# Patient Record
Sex: Female | Born: 1985
Health system: Southern US, Community
[De-identification: ages and names within clinical notes are randomized; demographics above are authoritative.]

## PROBLEM LIST (undated history)

## (undated) ENCOUNTER — Inpatient Hospital Stay (HOSPITAL_COMMUNITY): Payer: Self-pay

## (undated) DIAGNOSIS — J40 Bronchitis, not specified as acute or chronic: Secondary | ICD-10-CM

## (undated) DIAGNOSIS — K819 Cholecystitis, unspecified: Secondary | ICD-10-CM

## (undated) DIAGNOSIS — F419 Anxiety disorder, unspecified: Secondary | ICD-10-CM

## (undated) DIAGNOSIS — A048 Other specified bacterial intestinal infections: Secondary | ICD-10-CM

## (undated) DIAGNOSIS — F32A Depression, unspecified: Secondary | ICD-10-CM

## (undated) DIAGNOSIS — K5900 Constipation, unspecified: Secondary | ICD-10-CM

## (undated) DIAGNOSIS — K219 Gastro-esophageal reflux disease without esophagitis: Secondary | ICD-10-CM

## (undated) DIAGNOSIS — F329 Major depressive disorder, single episode, unspecified: Secondary | ICD-10-CM

## (undated) DIAGNOSIS — D259 Leiomyoma of uterus, unspecified: Secondary | ICD-10-CM

## (undated) DIAGNOSIS — G43909 Migraine, unspecified, not intractable, without status migrainosus: Secondary | ICD-10-CM

## (undated) DIAGNOSIS — A749 Chlamydial infection, unspecified: Secondary | ICD-10-CM

## (undated) HISTORY — DX: Leiomyoma of uterus, unspecified: D25.9

## (undated) HISTORY — PX: NO PAST SURGERIES: SHX2092

## (undated) HISTORY — PX: INDUCED ABORTION: SHX677

## (undated) HISTORY — PX: THERAPEUTIC ABORTION: SHX798

---
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
## 2006-02-11 ENCOUNTER — Other Ambulatory Visit: Admission: RE | Admit: 2006-02-11 | Discharge: 2006-02-11 | Payer: Self-pay | Admitting: Obstetrics and Gynecology

## 2006-06-10 ENCOUNTER — Inpatient Hospital Stay (HOSPITAL_COMMUNITY): Admission: AD | Admit: 2006-06-10 | Discharge: 2006-06-10 | Payer: Self-pay | Admitting: Obstetrics and Gynecology

## 2006-07-31 ENCOUNTER — Inpatient Hospital Stay (HOSPITAL_COMMUNITY): Admission: AD | Admit: 2006-07-31 | Discharge: 2006-07-31 | Payer: Self-pay | Admitting: Obstetrics and Gynecology

## 2006-08-01 ENCOUNTER — Inpatient Hospital Stay (HOSPITAL_COMMUNITY): Admission: AD | Admit: 2006-08-01 | Discharge: 2006-08-04 | Payer: Self-pay | Admitting: Obstetrics and Gynecology

## 2006-09-27 ENCOUNTER — Other Ambulatory Visit: Admission: RE | Admit: 2006-09-27 | Discharge: 2006-09-27 | Payer: Self-pay | Admitting: Obstetrics and Gynecology

## 2006-10-05 ENCOUNTER — Emergency Department (HOSPITAL_COMMUNITY): Admission: EM | Admit: 2006-10-05 | Discharge: 2006-10-05 | Payer: Self-pay | Admitting: Emergency Medicine

## 2006-12-09 ENCOUNTER — Emergency Department (HOSPITAL_COMMUNITY): Admission: EM | Admit: 2006-12-09 | Discharge: 2006-12-09 | Payer: Self-pay | Admitting: Emergency Medicine

## 2007-05-04 ENCOUNTER — Emergency Department (HOSPITAL_COMMUNITY): Admission: EM | Admit: 2007-05-04 | Discharge: 2007-05-04 | Payer: Self-pay | Admitting: Emergency Medicine

## 2007-08-02 ENCOUNTER — Emergency Department (HOSPITAL_COMMUNITY): Admission: EM | Admit: 2007-08-02 | Discharge: 2007-08-02 | Payer: Self-pay | Admitting: Emergency Medicine

## 2008-10-28 ENCOUNTER — Emergency Department (HOSPITAL_COMMUNITY): Admission: EM | Admit: 2008-10-28 | Discharge: 2008-10-28 | Payer: Self-pay | Admitting: Emergency Medicine

## 2009-12-28 ENCOUNTER — Emergency Department (HOSPITAL_COMMUNITY): Admission: EM | Admit: 2009-12-28 | Discharge: 2009-12-28 | Payer: Self-pay | Admitting: Emergency Medicine

## 2010-04-25 ENCOUNTER — Emergency Department (HOSPITAL_COMMUNITY): Admission: EM | Admit: 2010-04-25 | Discharge: 2010-04-25 | Payer: Self-pay | Admitting: Emergency Medicine

## 2010-04-30 ENCOUNTER — Emergency Department (HOSPITAL_COMMUNITY): Admission: EM | Admit: 2010-04-30 | Discharge: 2010-04-30 | Payer: Self-pay

## 2011-03-20 LAB — URINALYSIS, ROUTINE W REFLEX MICROSCOPIC
Glucose, UA: NEGATIVE mg/dL
Hgb urine dipstick: NEGATIVE
Ketones, ur: NEGATIVE mg/dL
Nitrite: NEGATIVE
Specific Gravity, Urine: 1.02 (ref 1.005–1.030)
Specific Gravity, Urine: 1.01 (ref 1.005–1.030)
Urobilinogen, UA: 0.2 mg/dL (ref 0.0–1.0)
pH: 6 (ref 5.0–8.0)

## 2011-03-20 LAB — COMPREHENSIVE METABOLIC PANEL
ALT: 23 U/L (ref 0–35)
AST: 23 U/L (ref 0–37)
Albumin: 4.1 g/dL (ref 3.5–5.2)
Alkaline Phosphatase: 49 U/L (ref 39–117)
Glucose, Bld: 101 mg/dL — ABNORMAL HIGH (ref 70–99)
Potassium: 3.8 mEq/L (ref 3.5–5.1)
Sodium: 136 mEq/L (ref 135–145)
Total Protein: 7.2 g/dL (ref 6.0–8.3)

## 2011-03-20 LAB — DIFFERENTIAL
Basophils Relative: 0 % (ref 0–1)
Eosinophils Absolute: 0.2 10*3/uL (ref 0.0–0.7)
Eosinophils Relative: 3 % (ref 0–5)
Monocytes Absolute: 0.8 10*3/uL (ref 0.1–1.0)
Monocytes Relative: 9 % (ref 3–12)
Neutrophils Relative %: 62 % (ref 43–77)

## 2011-03-20 LAB — CBC
Hemoglobin: 14.3 g/dL (ref 12.0–15.0)
RDW: 12.9 % (ref 11.5–15.5)

## 2011-03-20 LAB — PREGNANCY, URINE: Preg Test, Ur: NEGATIVE

## 2011-04-02 LAB — URINALYSIS, ROUTINE W REFLEX MICROSCOPIC
Glucose, UA: NEGATIVE mg/dL
Hgb urine dipstick: NEGATIVE
Ketones, ur: NEGATIVE mg/dL
Protein, ur: NEGATIVE mg/dL

## 2011-04-02 LAB — URINE MICROSCOPIC-ADD ON

## 2011-04-02 LAB — WET PREP, GENITAL
Trich, Wet Prep: NONE SEEN
Yeast Wet Prep HPF POC: NONE SEEN

## 2011-04-02 LAB — GC/CHLAMYDIA PROBE AMP, GENITAL: Chlamydia, DNA Probe: POSITIVE — AB

## 2011-05-14 ENCOUNTER — Emergency Department (HOSPITAL_COMMUNITY)
Admission: EM | Admit: 2011-05-14 | Discharge: 2011-05-14 | Disposition: A | Discharge status: Home / Self Care | Payer: Self-pay | Attending: Emergency Medicine | Admitting: Emergency Medicine

## 2011-05-14 DIAGNOSIS — H9190 Unspecified hearing loss, unspecified ear: Secondary | ICD-10-CM | POA: Insufficient documentation

## 2011-05-14 DIAGNOSIS — H6120 Impacted cerumen, unspecified ear: Secondary | ICD-10-CM | POA: Insufficient documentation

## 2011-05-14 DIAGNOSIS — H9209 Otalgia, unspecified ear: Secondary | ICD-10-CM | POA: Insufficient documentation

## 2011-05-18 NOTE — Discharge Summary (Signed)
Krista Vaughn, Krista Vaughn             ACCOUNT NO.:  [id number]   MEDICAL RECORD NO.:  [id number]          PATIENT TYPE:  INP   LOCATION:  9101                          FACILITY:  [facility]   PHYSICIAN:  Rudy Jew. Ashley Royalty, M.D.DATE OF BIRTH:  08/06/1986   DATE OF ADMISSION:  08/01/2006  DATE OF DISCHARGE:  08/04/2006                                 DISCHARGE SUMMARY   DISCHARGE DIAGNOSES:  1. Intrauterine pregnancy at term, delivered.  2. Term birth living child, vertex.   OPERATIONS/SPECIAL PROCEDURES:  OB delivery with repair of second degree  midline laceration.   CONSULTATIONS:  None.   DISCHARGE MEDICATIONS:  Percocet, Motrin 600 mg.   HISTORY:  This is an 25 year old gravida 2, para 1, 0, 0, 1 at 40-weeks, 2-  days gestation. The patient presented in labor and was noted to be 8 cm upon  arrival. For remainder of the history and physical please see chart.   HOSPITAL COURSE:  The patient was admitted to [facility]. Admission  laboratory studies were drawn. She went on to labor and delivery on August 02, 2006. The infant was a 7 pound, 6 ounce female, Apgar's 8 at 1 minute and 9  at 5 minutes, sent to the newborn nursery. Delivery was accomplished by Dr.  Sydnee Cabal  Over an intact perineum. There was a second degree midline laceration which  was repaired without difficulty. The patient's postpartum course was benign.  She was discharged on the second postpartum day afebrile and in satisfactory  condition.   DISPOSITION:  The patient is to return to [facility] in  4-6 weeks for postpartum evaluation.      James A. Ashley Royalty, M.D.  Electronically Signed     JAM/MEDQ  D:  08/04/2006  T:  08/04/2006  Job:  [id number]

## 2011-10-01 LAB — URINE MICROSCOPIC-ADD ON

## 2011-10-01 LAB — URINALYSIS, ROUTINE W REFLEX MICROSCOPIC
Glucose, UA: NEGATIVE
Hgb urine dipstick: NEGATIVE
Specific Gravity, Urine: 1.025
Urobilinogen, UA: 1

## 2011-10-15 LAB — URINALYSIS, ROUTINE W REFLEX MICROSCOPIC
Bilirubin Urine: NEGATIVE
Hgb urine dipstick: NEGATIVE
Nitrite: NEGATIVE
Specific Gravity, Urine: 1.02
pH: 6.5

## 2011-11-30 ENCOUNTER — Encounter: Payer: Self-pay | Admitting: Emergency Medicine

## 2011-11-30 ENCOUNTER — Emergency Department (HOSPITAL_COMMUNITY): Payer: Self-pay

## 2011-11-30 ENCOUNTER — Emergency Department (HOSPITAL_COMMUNITY)
Admission: EM | Admit: 2011-11-30 | Discharge: 2011-11-30 | Disposition: A | Discharge status: Home / Self Care | Payer: Self-pay | Attending: Emergency Medicine | Admitting: Emergency Medicine

## 2011-11-30 DIAGNOSIS — R509 Fever, unspecified: Secondary | ICD-10-CM | POA: Insufficient documentation

## 2011-11-30 DIAGNOSIS — R221 Localized swelling, mass and lump, neck: Secondary | ICD-10-CM | POA: Insufficient documentation

## 2011-11-30 DIAGNOSIS — IMO0001 Reserved for inherently not codable concepts without codable children: Secondary | ICD-10-CM | POA: Insufficient documentation

## 2011-11-30 DIAGNOSIS — J111 Influenza due to unidentified influenza virus with other respiratory manifestations: Secondary | ICD-10-CM | POA: Insufficient documentation

## 2011-11-30 DIAGNOSIS — R0602 Shortness of breath: Secondary | ICD-10-CM | POA: Insufficient documentation

## 2011-11-30 DIAGNOSIS — R220 Localized swelling, mass and lump, head: Secondary | ICD-10-CM | POA: Insufficient documentation

## 2011-11-30 DIAGNOSIS — R05 Cough: Secondary | ICD-10-CM | POA: Insufficient documentation

## 2011-11-30 DIAGNOSIS — R0789 Other chest pain: Secondary | ICD-10-CM | POA: Insufficient documentation

## 2011-11-30 DIAGNOSIS — R062 Wheezing: Secondary | ICD-10-CM | POA: Insufficient documentation

## 2011-11-30 DIAGNOSIS — J3489 Other specified disorders of nose and nasal sinuses: Secondary | ICD-10-CM | POA: Insufficient documentation

## 2011-11-30 DIAGNOSIS — R5381 Other malaise: Secondary | ICD-10-CM | POA: Insufficient documentation

## 2011-11-30 DIAGNOSIS — R059 Cough, unspecified: Secondary | ICD-10-CM | POA: Insufficient documentation

## 2011-11-30 DIAGNOSIS — M549 Dorsalgia, unspecified: Secondary | ICD-10-CM | POA: Insufficient documentation

## 2011-11-30 DIAGNOSIS — R51 Headache: Secondary | ICD-10-CM | POA: Insufficient documentation

## 2011-11-30 MED ORDER — GUAIFENESIN-CODEINE 100-10 MG/5ML PO SYRP: 5.0000 mL | ORAL_SOLUTION | Freq: Three times a day (TID) | ORAL | Status: AC | PRN

## 2011-11-30 MED ORDER — OSELTAMIVIR PHOSPHATE 75 MG PO CAPS: 75.0000 mg | ORAL_CAPSULE | Freq: Two times a day (BID) | ORAL | Status: AC

## 2011-11-30 MED ORDER — ALBUTEROL SULFATE (5 MG/ML) 0.5% IN NEBU
5.0000 mg | INHALATION_SOLUTION | Freq: Once | RESPIRATORY_TRACT | Status: AC
  Administered 2011-11-30: 5 mg via RESPIRATORY_TRACT
  Filled 2011-11-30: qty 0.5

## 2011-11-30 MED ORDER — ALBUTEROL SULFATE HFA 108 (90 BASE) MCG/ACT IN AERS
1.0000 | INHALATION_SPRAY | Freq: Once | RESPIRATORY_TRACT | Status: AC
  Administered 2011-11-30: 1 via RESPIRATORY_TRACT
  Filled 2011-11-30: qty 6.7

## 2011-11-30 MED ORDER — KETOROLAC TROMETHAMINE 60 MG/2ML IM SOLN
60.0000 mg | Freq: Once | INTRAMUSCULAR | Status: AC
  Administered 2011-11-30: 60 mg via INTRAMUSCULAR
  Filled 2011-11-30: qty 2

## 2011-11-30 NOTE — Discharge Instructions (Signed)
 Robitussin AC contains codeine , a mild narcotic which is for pain and coughing.  It can cause drowsiness or slowed breathing so use with caution.  Take the Tamiflu  for suspected flu infection.  Your chest xray was normal, no pneumonia or bronchitis was seen.  Continue taking ibuprofen  for muscle aches and fevers as well.

## 2011-11-30 NOTE — ED Notes (Signed)
Pt c/o CP with inspiration and cough; pt sts head congestion and cold recently

## 2011-11-30 NOTE — ED Notes (Signed)
C/o cold, cough, chest congestion x 2 days. Chest tightness, SOB, h/a, generalized body aches, sore throat, unable to take a deep breath, fever, chills. Pain worse with deep breasths, coughing.

## 2011-11-30 NOTE — ED Provider Notes (Addendum)
History     CSN: [id number] Arrival date & time: 11/30/2011  3:43 PM   First MD Initiated Contact with Patient 11/30/11 1759      Chief Complaint  Patient presents with  . Chest Pain    (Consider location/radiation/quality/duration/timing/severity/associated sxs/prior treatment) HPI Comments: Since yesterday the patient has developed chills, feeling hot and cold and has had a diffuse headache. She's had some mild nasal congestion, no sore throat, but significant coughing. She does have chest pain which is worse with deep breath and coughing. She has mild chest pain even at rest. She is a passive smoker occasionally smoking on weekends socially. No drug use in the past. She's never had pneumonia or bronchitis that she is aware of. She denies any recent foreign travel, long distance travel, lower extremity swelling or pain. She does have some sharp pains over her body associated with diffuse muscle achiness. She has not taken her temperature at home. She has had some occasional clamminess and sweating. She denies any vomiting or diarrhea.  Patient is a 25 y.o. female presenting with chest pain. The history is provided by the patient.  Chest Pain Primary symptoms include a fever, fatigue, shortness of breath and cough. Pertinent negatives for primary symptoms include no wheezing, no nausea and no vomiting.     History reviewed. No pertinent past medical history.  History reviewed. No pertinent past surgical history.  History reviewed. No pertinent family history.  History  Substance Use Topics  . Smoking status: Passive Smoker  . Smokeless tobacco: Not on file  . Alcohol Use: No    OB History    Grav Para Term Preterm Abortions TAB SAB Ect Mult Living                  Review of Systems  Constitutional: Positive for fever, chills and fatigue.  HENT: Positive for congestion and sinus pressure.   Respiratory: Positive for cough, chest tightness and shortness of breath. Negative  for wheezing.   Cardiovascular: Positive for chest pain.  Gastrointestinal: Negative for nausea, vomiting and diarrhea.  Genitourinary: Negative for dysuria and flank pain.  Musculoskeletal: Positive for myalgias and back pain.  All other systems reviewed and are negative.    Allergies  Review of patient's allergies indicates no known allergies.  Home Medications  No current outpatient prescriptions on file.  BP 117/56  Pulse 98  Temp(Src) 99.9 F (37.7 C) (Oral)  Resp 18  SpO2 99%  Physical Exam  Nursing note and vitals reviewed. Constitutional: She appears well-developed and well-nourished.  HENT:  Head: Normocephalic and atraumatic.  Nose: Mucosal edema present.  Mouth/Throat: Uvula is midline. Mucous membranes are not pale, not dry and not cyanotic. Posterior oropharyngeal edema present. No oropharyngeal exudate.       Tenderness at nasal sinuses and pressure to nasal bridge  Eyes: Pupils are equal, round, and reactive to light.  Neck: Normal range of motion.  Cardiovascular: Normal rate.  Exam reveals no gallop and no friction rub.   No murmur heard. Pulmonary/Chest: No stridor. No respiratory distress. She has wheezes. She exhibits tenderness.  Abdominal: Soft. Bowel sounds are normal. There is no tenderness. There is no rebound and no guarding.  Neurological: She is alert.    ED Course  Procedures (including critical care time)  Labs Reviewed - No data to display Dg Chest 2 View  11/30/2011  *RADIOLOGY REPORT*  Clinical Data: Cough and chest pain.  CHEST - 2 VIEW  Comparison: None  Findings:  The cardiac silhouette, mediastinal and hilar contours are within normal limits.  The lungs are clear.  No pleural effusion.  The bony thorax is intact.  IMPRESSION: Normal chest x-ray.  Original Report Authenticated By: P. Loralie Champagne, M.D.     No diagnosis found.    MDM    Pt with fever to 101, slight wheezing, here has severe musculoskeletal CP that is  reproducible, will give IM toradol for symptoms. Pt may have influenza, has not had vaccination.  She tried Mucinex yesterday but no significant improvement.  Will give breathing neb as well.  Pt had 1 vomiting here but seems to have been post-tussive       6:54 PM  I reviewed pt's CXR, per radiologist, normal CXR.  Pt feels improved, is breathing more easily, fever is reduced.  RA sats are 99% which is normal.  Pt is drinking gingerale with no nausea or vomiting.       Gavin Pound. Oletta Lamas, MD 11/30/11 1950  Gavin Pound. Oletta Lamas, MD 11/30/11 1610

## 2012-07-14 ENCOUNTER — Encounter (HOSPITAL_COMMUNITY): Payer: Self-pay | Admitting: *Deleted

## 2012-07-14 ENCOUNTER — Emergency Department (HOSPITAL_COMMUNITY)
Admission: EM | Admit: 2012-07-14 | Discharge: 2012-07-14 | Disposition: A | Discharge status: Home Health | Payer: Self-pay | Attending: Emergency Medicine | Admitting: Emergency Medicine

## 2012-07-14 DIAGNOSIS — K0889 Other specified disorders of teeth and supporting structures: Secondary | ICD-10-CM

## 2012-07-14 DIAGNOSIS — K029 Dental caries, unspecified: Secondary | ICD-10-CM | POA: Insufficient documentation

## 2012-07-14 DIAGNOSIS — K047 Periapical abscess without sinus: Secondary | ICD-10-CM | POA: Insufficient documentation

## 2012-07-14 MED ORDER — AMOXICILLIN 500 MG PO CAPS: 500.0000 mg | ORAL_CAPSULE | Freq: Three times a day (TID) | ORAL | Status: AC

## 2012-07-14 MED ORDER — HYDROCODONE-ACETAMINOPHEN 5-500 MG PO TABS: 1.0000 | ORAL_TABLET | Freq: Four times a day (QID) | ORAL | Status: AC | PRN

## 2012-07-14 NOTE — ED Provider Notes (Signed)
History     CSN: [id number]  Arrival date & time 07/14/12  1857   First MD Initiated Contact with Patient 07/14/12 1951      Chief Complaint  Patient presents with  . Dental Pain    (Consider location/radiation/quality/duration/timing/severity/associated sxs/prior treatment) Patient is a 26 y.o. female presenting with tooth pain. The history is provided by the patient.  Dental PainPrimary symptoms do not include fever or sore throat.  pt c/o right lower dental pain for past week. Constant, dull, nonradiating. Worse w eating. No local dentist. Denies trouble breathing or swallowing. No throat swelling. No fever or chills. No facial redness or swelling.   History reviewed. No pertinent past medical history.  History reviewed. No pertinent past surgical history.  History reviewed. No pertinent family history.  History  Substance Use Topics  . Smoking status: Passive Smoker  . Smokeless tobacco: Not on file  . Alcohol Use: No    OB History    Grav Para Term Preterm Abortions TAB SAB Ect Mult Living                  Review of Systems  Constitutional: Negative for fever and chills.  HENT: Negative for sore throat.   Skin: Negative for rash.    Allergies  Review of patient's allergies indicates no known allergies.  Home Medications   Current Outpatient Rx  Name Route Sig Dispense Refill  . BENZOCAINE 10 % MT GEL Mouth/Throat Use as directed 1 application in the mouth or throat as needed.    . IBUPROFEN 200 MG PO TABS Oral Take 200 mg by mouth every 6 (six) hours as needed. pain      BP 101/57  Pulse 77  Temp 99.1 F (37.3 C) (Oral)  Resp 17  SpO2 100%  Physical Exam  Nursing note and vitals reviewed. Constitutional: She appears well-developed and well-nourished. No distress.  HENT:  Mouth/Throat: Oropharynx is clear and moist.       Right lower molar dental decay, w part of tooth broken off above gum. No bleeding. +gum swelling/tendernss. No trimus. No  swelling to pharynx or floor of mouth. No neck swelling or pain.  No facial redness or swelling.   Eyes: Conjunctivae are normal. No scleral icterus.  Neck: Neck supple. No tracheal deviation present.  Cardiovascular: Normal rate.   Pulmonary/Chest: Effort normal. No respiratory distress.  Abdominal: Normal appearance. She exhibits no distension.  Musculoskeletal: She exhibits no edema.  Lymphadenopathy:    She has no cervical adenopathy.  Neurological: She is alert.  Skin: Skin is warm and dry. No rash noted.  Psychiatric: She has a normal mood and affect.    ED Course  Procedures (including critical care time)     MDM  Pt drove self to ed. Confirmed nkda. Will rx amox, vicodin, dental follow up.         Suzi Roots, MD 07/14/12 2003

## 2012-07-14 NOTE — ED Notes (Signed)
Pt reports right back dental back since Thursday, reports no relief at home with medications. States she chipped her tooth and it has been bothering her sense.

## 2012-08-10 ENCOUNTER — Emergency Department (HOSPITAL_COMMUNITY)
Admission: EM | Admit: 2012-08-10 | Discharge: 2012-08-10 | Disposition: A | Discharge status: Home / Self Care | Payer: Self-pay | Attending: Emergency Medicine | Admitting: Emergency Medicine

## 2012-08-10 ENCOUNTER — Encounter (HOSPITAL_COMMUNITY): Payer: Self-pay | Admitting: *Deleted

## 2012-08-10 DIAGNOSIS — O210 Mild hyperemesis gravidarum: Secondary | ICD-10-CM | POA: Insufficient documentation

## 2012-08-10 DIAGNOSIS — Z3490 Encounter for supervision of normal pregnancy, unspecified, unspecified trimester: Secondary | ICD-10-CM

## 2012-08-10 DIAGNOSIS — O219 Vomiting of pregnancy, unspecified: Secondary | ICD-10-CM

## 2012-08-10 DIAGNOSIS — R197 Diarrhea, unspecified: Secondary | ICD-10-CM | POA: Insufficient documentation

## 2012-08-10 LAB — URINALYSIS, ROUTINE W REFLEX MICROSCOPIC
Ketones, ur: NEGATIVE mg/dL
Leukocytes, UA: NEGATIVE
Nitrite: NEGATIVE
Specific Gravity, Urine: 1.03 (ref 1.005–1.030)
Urobilinogen, UA: 1 mg/dL (ref 0.0–1.0)
pH: 6.5 (ref 5.0–8.0)

## 2012-08-10 LAB — POCT I-STAT, CHEM 8
Calcium, Ion: 1.25 mmol/L — ABNORMAL HIGH (ref 1.12–1.23)
Creatinine, Ser: 0.6 mg/dL (ref 0.50–1.10)
Hemoglobin: 13.6 g/dL (ref 12.0–15.0)
Sodium: 138 mEq/L (ref 135–145)
TCO2: 20 mmol/L (ref 0–100)

## 2012-08-10 MED ORDER — SODIUM CHLORIDE 0.9 % IV BOLUS (SEPSIS)
1000.0000 mL | Freq: Once | INTRAVENOUS | Status: AC
  Administered 2012-08-10: 1000 mL via INTRAVENOUS

## 2012-08-10 MED ORDER — MORPHINE SULFATE 4 MG/ML IJ SOLN
4.0000 mg | Freq: Once | INTRAMUSCULAR | Status: AC
  Administered 2012-08-10: 4 mg via INTRAVENOUS
  Filled 2012-08-10: qty 1

## 2012-08-10 MED ORDER — PENICILLIN V POTASSIUM 250 MG PO TABS
500.0000 mg | ORAL_TABLET | Freq: Once | ORAL | Status: AC
  Administered 2012-08-10: 500 mg via ORAL
  Filled 2012-08-10: qty 2

## 2012-08-10 MED ORDER — PENICILLIN V POTASSIUM 500 MG PO TABS: 500.0000 mg | ORAL_TABLET | Freq: Four times a day (QID) | ORAL | Status: AC

## 2012-08-10 MED ORDER — ONDANSETRON HCL 4 MG/2ML IJ SOLN
4.0000 mg | Freq: Once | INTRAMUSCULAR | Status: AC
  Administered 2012-08-10: 4 mg via INTRAVENOUS
  Filled 2012-08-10: qty 2

## 2012-08-10 MED ORDER — OXYCODONE-ACETAMINOPHEN 5-325 MG PO TABS: ORAL_TABLET | ORAL | Status: AC

## 2012-08-10 MED ORDER — METOCLOPRAMIDE HCL 10 MG PO TABS: 10.0000 mg | ORAL_TABLET | Freq: Four times a day (QID) | ORAL | Status: DC

## 2012-08-10 NOTE — ED Notes (Addendum)
Pt. Reports  Right sided dental pain "on and off " x 8 days. Pain radiates up the face and over the eye. 8/10. "Hurts worse when eating and talking". Pt also report nausea,vomiting, diarrhea x 3 days. States son has had a virus this week with the N/V/D.  Last episode last night. Pt. Reports taking Amoxicillin for a cavity that the Dentist was unable to fill due to [facility] reasons. Denies abdominal pain. States "It only hurt right after I eat or drink, because I am vomiting". Abdomen non tender on palpation. Pt is sitting up in bed. Respirations even and regular. Vitals stable.  A.O. X 4. NAD.

## 2012-08-10 NOTE — ED Notes (Signed)
Lab at bedside

## 2012-08-10 NOTE — ED Notes (Signed)
Patient given Sprite and crackers.

## 2012-08-10 NOTE — ED Notes (Signed)
PT is here with right lower molar pain.  Pt reports vomiting for 3 days.  Pt reports diarrhea.  No urinary or vaginal symptoms.  Pt states unsure last menstral

## 2012-08-10 NOTE — ED Notes (Signed)
Patient discharged with instructions and prescriptions with her mother.

## 2012-08-10 NOTE — ED Notes (Signed)
PA at bedside.

## 2012-08-10 NOTE — ED Provider Notes (Signed)
Medical screening examination/treatment/procedure(s) were conducted as a shared visit with non-physician practitioner(s) and myself.  I personally evaluated the patient during the encounter Patient with symptoms most consistent with gastroenteritis with nausea, vomiting and diarrhea. Her son has the exact same symptoms. However patient also pregnancy test positive today. Bedside ultrasound shows a 7 week fetus that is in the uterus which is consistent with her quantitative 3400. Patient denies any vaginal bleeding or abdominal pain at this time.  Gwyneth Sprout, MD 08/10/12 1304

## 2012-08-10 NOTE — ED Provider Notes (Signed)
History     CSN: [id number]  Arrival date & time 08/10/12  0546   None     Chief Complaint  Patient presents with  . Dental Pain  . Emesis    (Consider location/radiation/quality/duration/timing/severity/associated sxs/prior treatment) HPI  Or-year-old female in no acute distress complaining of nausea vomiting and diarrhea without fever, or abdominal pain or urinary symtoms for 3 days.  Her son is also sick with similar nausea vomiting and diarrhea. Patient states she's not able to tolerate by mouth except ginger ale. Patient also states that she has right lower molar pain that has been worsening over the course of several months. Patient has been seen prior for similar but states she cannot get into see a dentist of financial reasons. Patient denies any gum swelling. Pain is 8/10 moderately relieved with Motrin.  History reviewed. No pertinent past medical history.  History reviewed. No pertinent past surgical history.  No family history on file.  History  Substance Use Topics  . Smoking status: Passive Smoker  . Smokeless tobacco: Not on file  . Alcohol Use: No    OB History    Grav Para Term Preterm Abortions TAB SAB Ect Mult Living                  Review of Systems  Constitutional: Negative for fever.  HENT: Positive for dental problem.   Gastrointestinal: Positive for nausea, vomiting and diarrhea. Negative for abdominal pain.  Genitourinary: Negative for urgency.  Skin: Negative for rash.  All other systems reviewed and are negative.    Allergies  Review of patient's allergies indicates no known allergies.  Home Medications   Current Outpatient Rx  Name Route Sig Dispense Refill  . BENZOCAINE 10 % MT GEL Mouth/Throat Use as directed 1 application in the mouth or throat as needed.    . IBUPROFEN 200 MG PO TABS Oral Take 200 mg by mouth every 6 (six) hours as needed. pain      BP 107/71  Pulse 82  Temp 98.7 F (37.1 C) (Oral)  Resp 18  SpO2  100%  Physical Exam  Nursing note and vitals reviewed. Constitutional: She is oriented to person, place, and time. She appears well-developed and well-nourished. No distress.  HENT:  Head: Normocephalic.  Eyes: Conjunctivae and EOM are normal.  Cardiovascular: Normal rate, regular rhythm, normal heart sounds and intact distal pulses.  Exam reveals no gallop and no friction rub.   No murmur heard. Pulmonary/Chest: Effort normal and breath sounds normal. No respiratory distress. She has no wheezes. She has no rales. She exhibits no tenderness.  Abdominal: Soft. Bowel sounds are normal. She exhibits no distension and no mass. There is no tenderness. There is no rebound and no guarding.  Musculoskeletal: Normal range of motion.  Neurological: She is alert and oriented to person, place, and time.  Skin: She is not diaphoretic.  Psychiatric: She has a normal mood and affect.    ED Course  Procedures (including critical care time)  Labs Reviewed  URINALYSIS, ROUTINE W REFLEX MICROSCOPIC - Abnormal; Notable for the following:    APPearance CLOUDY (*)     All other components within normal limits  POCT PREGNANCY, URINE - Abnormal; Notable for the following:    Preg Test, Ur POSITIVE (*)     All other components within normal limits  HCG, QUANTITATIVE, PREGNANCY - Abnormal; Notable for the following:    hCG, Beta Chain, Quant, S 29528 (*)     All  other components within normal limits  POCT I-STAT, CHEM 8 - Abnormal; Notable for the following:    Potassium 3.4 (*)     BUN 4 (*)     Glucose, Bld 109 (*)     Calcium, Ion 1.25 (*)     All other components within normal limits   No results found.   1. Diarrhea   2. Nausea and vomiting in pregnancy   3. Pregnancy       MDM  Non-toxic female with stable vital signs complaining of a Nausea, vomiting and diarrhea in addition to chronic, uncomplicated dental issues. I will rehydrate her control her nausea and pain we'll prophylax her for  dental abscess with penicillin and give her a resource guide so she can find outpatient dental care.  Her pregnancy urine pregnancy test is positive. Patient cannot tell me her last period, she is G4P2, d/c'd Mirena IUD in January,  has had one elective abortion at the end of January or beginning of February. Patient has hadirregular periods since that time. Denies any abdominal pain or vaginal bleeding or discharge.   Shared visit with attending, Dr. Anitra Lauth, who performed a bedside transabdominal ultrasound showing a gestational sac with fetal pole in the uterus.  Gestational age calculated at  approximately 7 weeks by crown rump length.  Orthostatic BP and blood work is normal as per i-STAT 8 urinalysis also shows no signs of infection. Patient is being hydrated her nausea and  pain are controlled and she will be given a by mouth challenge.  Pt tolerating PO and VSS remain stable. Pt is stable for d/c to home.    Wynetta Emery, PA-C 08/10/12 [phone]

## 2012-08-30 ENCOUNTER — Emergency Department (HOSPITAL_COMMUNITY)
Admission: EM | Admit: 2012-08-30 | Discharge: 2012-08-30 | Disposition: A | Discharge status: Home / Self Care | Payer: Self-pay | Attending: Emergency Medicine | Admitting: Emergency Medicine

## 2012-08-30 ENCOUNTER — Encounter (HOSPITAL_COMMUNITY): Payer: Self-pay | Admitting: Emergency Medicine

## 2012-08-30 DIAGNOSIS — L03119 Cellulitis of unspecified part of limb: Secondary | ICD-10-CM | POA: Insufficient documentation

## 2012-08-30 DIAGNOSIS — L0390 Cellulitis, unspecified: Secondary | ICD-10-CM

## 2012-08-30 DIAGNOSIS — O99330 Smoking (tobacco) complicating pregnancy, unspecified trimester: Secondary | ICD-10-CM | POA: Insufficient documentation

## 2012-08-30 DIAGNOSIS — N898 Other specified noninflammatory disorders of vagina: Secondary | ICD-10-CM | POA: Insufficient documentation

## 2012-08-30 DIAGNOSIS — L02419 Cutaneous abscess of limb, unspecified: Secondary | ICD-10-CM | POA: Insufficient documentation

## 2012-08-30 DIAGNOSIS — Z3490 Encounter for supervision of normal pregnancy, unspecified, unspecified trimester: Secondary | ICD-10-CM

## 2012-08-30 DIAGNOSIS — O99891 Other specified diseases and conditions complicating pregnancy: Secondary | ICD-10-CM | POA: Insufficient documentation

## 2012-08-30 LAB — CBC WITH DIFFERENTIAL/PLATELET
Basophils Absolute: 0 10*3/uL (ref 0.0–0.1)
Basophils Relative: 0 % (ref 0–1)
Eosinophils Absolute: 0 10*3/uL (ref 0.0–0.7)
Eosinophils Relative: 0 % (ref 0–5)
Lymphs Abs: 1.7 10*3/uL (ref 0.7–4.0)
MCH: 29.1 pg (ref 26.0–34.0)
MCHC: 33.3 g/dL (ref 30.0–36.0)
Neutrophils Relative %: 77 % (ref 43–77)
Platelets: 208 10*3/uL (ref 150–400)
RBC: 4.37 MIL/uL (ref 3.87–5.11)
RDW: 12.6 % (ref 11.5–15.5)

## 2012-08-30 LAB — URINE MICROSCOPIC-ADD ON

## 2012-08-30 LAB — BASIC METABOLIC PANEL
Calcium: 10.1 mg/dL (ref 8.4–10.5)
GFR calc Af Amer: >90 mL/min (ref >90)
GFR calc non Af Amer: >90 mL/min (ref >90)
Potassium: 3.6 mEq/L (ref 3.5–5.1)
Sodium: 134 mEq/L — ABNORMAL LOW (ref 135–145)

## 2012-08-30 LAB — URINALYSIS, ROUTINE W REFLEX MICROSCOPIC
Nitrite: NEGATIVE
Protein, ur: 30 mg/dL — AB
Specific Gravity, Urine: 1.037 — ABNORMAL HIGH (ref 1.005–1.030)
Urobilinogen, UA: 1 mg/dL (ref 0.0–1.0)

## 2012-08-30 LAB — WET PREP, GENITAL
Trich, Wet Prep: NONE SEEN
Yeast Wet Prep HPF POC: NONE SEEN

## 2012-08-30 LAB — POCT PREGNANCY, URINE: Preg Test, Ur: POSITIVE — AB

## 2012-08-30 MED ORDER — CEPHALEXIN 500 MG PO CAPS: 500.0000 mg | ORAL_CAPSULE | Freq: Four times a day (QID) | ORAL | Status: AC

## 2012-08-30 NOTE — ED Notes (Signed)
Pt states bit by insect on right lower leg, now with swelling, redness in area, also c/o left lower abdominal pain, states unable to eat due to pain, denies vaginal bleeding

## 2012-08-30 NOTE — ED Notes (Signed)
Pt states understanding of discharge instructions 

## 2012-08-30 NOTE — ED Notes (Signed)
Pt reports bit by some bug a week ago to right shin area. Pt c/o abdominal cramping onset yesterday, pt currently [redacted] weeks pregnant. Pt c/o n/v without relief from reglan. Pt also c/o tingling in right hand onset today. Pt reports last pregnancy when she had tingling in her hand she was told she was lacking something, but does not recall what she was lacking.

## 2012-08-30 NOTE — ED Provider Notes (Addendum)
History  This chart was scribed for Derwood Kaplan, MD by Erskine Emery. This patient was seen in room TR06C/TR06C and the patient's care was started at 19:01.   CSN: [id number]  Arrival date & time 08/30/12  1238   None     Chief Complaint  Patient presents with  . Insect Bite  . Abdominal Cramping    [redacted] weeks pregnant    (Consider location/radiation/quality/duration/timing/severity/associated sxs/prior treatment) The history is provided by the patient. No language interpreter was used.   Krista Vaughn is a 26 y.o. female who presents to the Emergency Department complaining of gradually worsening abdominal cramping (described as like a period cramp) since yesterday. Pt denies any associated dysuria, vaginal bleeding, hematuria, or recent trauma but reports some abnormal vaginal discharge for the last 3-4 days. Pt reports the last time she had intercourse was about a week ago. Pt doesn't think her pain is GI related because she has been taking medication for gas. Pt hasn't had a period since June because she is about 10-[redacted] weeks pregnant. Pt had an ultrasound last time she was here but does not have a PCP because she is waiting on pregnancy medicaid. Pt has had no surgery to the abdominal area, including cesarian sections. Pt is G4 P2 A1, she had an abortion in January but has had no miscarriages or severe complications with her pregnancies. Pt reports she is taking her prenatal vitamins.  Pt also presents a painful area of erythema on her right shin for about a week. Pt reports she first noticed a little white pimple there a week ago, then it turned red and began to drain blood and puss. Pt denies any h/o similar lesions.  Pt has NKDA.     History reviewed. No pertinent past medical history.  History reviewed. No pertinent past surgical history.  No family history on file.  History  Substance Use Topics  . Smoking status: Current Some Day Smoker  . Smokeless tobacco: Not on  file  . Alcohol Use: No    OB History    Grav Para Term Preterm Abortions TAB SAB Ect Mult Living   1               Review of Systems  Constitutional: Negative for fever and chills.  Respiratory: Negative for shortness of breath.   Gastrointestinal: Positive for abdominal pain. Negative for nausea and vomiting.  Genitourinary: Positive for vaginal discharge. Negative for dysuria, hematuria and vaginal bleeding.  Skin: Positive for wound.  Neurological: Negative for weakness.    Allergies  Review of patient's allergies indicates no known allergies.  Home Medications   Current Outpatient Rx  Name Route Sig Dispense Refill  . ACETAMINOPHEN 500 MG PO TABS Oral Take 1,000 mg by mouth every 6 (six) hours as needed. For headache    . ALBUTEROL SULFATE HFA 108 (90 BASE) MCG/ACT IN AERS Inhalation Inhale 2 puffs into the lungs every 6 (six) hours as needed. Bronchial spasms    . IBUPROFEN 200 MG PO TABS Oral Take 600 mg by mouth every 6 (six) hours as needed. Tooth ache pain      BP 103/58  Pulse 96  Temp 98.6 F (37 C) (Oral)  Resp 16  SpO2 97%  LMP 06/21/2012  Physical Exam  Nursing note and vitals reviewed. Constitutional: She is oriented to person, place, and time. She appears well-developed and well-nourished. No distress.  HENT:  Head: Normocephalic and atraumatic.  Eyes: Conjunctivae and EOM are normal.  Pupils are equal, round, and reactive to light.  Neck: Normal range of motion. Neck supple. No tracheal deviation present.  Cardiovascular: Normal rate, regular rhythm, normal heart sounds and intact distal pulses.   No murmur heard. Pulmonary/Chest: Effort normal and breath sounds normal. No respiratory distress. She has no wheezes.       Anterior ascultation of the lungs is clear.  Abdominal: Soft. Bowel sounds are normal. She exhibits no distension. There is tenderness. There is no rebound and no guarding.       Tenderness is worst in the subrapubic region.  Voluntary guarding present.  Genitourinary: Vagina normal and uterus normal.       External exam - normal, no lesions Speculum exam:= white discharge only, no blood Bimanual exam: Patient has no CMT, no adnexal tenderness or fullness and cervical os is closed  Musculoskeletal: Normal range of motion.  Neurological: She is alert and oriented to person, place, and time.  Skin: Skin is warm and dry.       3 cm area of erythema with a vesicle in the middle and a black top. No fluctuants. Minimal induration. Palpable tenderness. Not warm to touch.  Psychiatric: She has a normal mood and affect. Her behavior is normal.    ED Course  Procedures (including critical care time) DIAGNOSTIC STUDIES: Oxygen Saturation is 100% on room air, normal by my interpretation.    COORDINATION OF CARE: 19:01--I evaluated the patient and we discussed a treatment plan including antibitoics and a pelvic exam to which the pt agreed. I instructed the pt to put a warm compress on the area every day.   Labs Reviewed  BASIC METABOLIC PANEL - Abnormal; Notable for the following:    Sodium 134 (*)     Glucose, Bld 103 (*)     BUN 5 (*)     All other components within normal limits  URINALYSIS, ROUTINE W REFLEX MICROSCOPIC - Abnormal; Notable for the following:    APPearance TURBID (*)     Specific Gravity, Urine 1.037 (*)     Ketones, ur 15 (*)     Protein, ur 30 (*)     All other components within normal limits  POCT PREGNANCY, URINE - Abnormal; Notable for the following:    Preg Test, Ur POSITIVE (*)     All other components within normal limits  URINE MICROSCOPIC-ADD ON - Abnormal; Notable for the following:    Crystals CA OXALATE CRYSTALS (*)     All other components within normal limits  CBC WITH DIFFERENTIAL   No results found.   No diagnosis found.    MDM  DDx includes: Ectopic pregnancy Threatened abortion Inevitable abortion Miscarriage Spontaneous abortion Trauma/cerval  laceration Placenta previa Placenta abruptio Ruptured uterus Dysfunctional uterine bleeding  Pt's exam is benign. Except for vaginal discharge - Pt consents to being empirically treated for STD. Also has cellulitis of the leg - no abscess that can be drained.  Medical screening examination/treatment/procedure(s) were conducted by me as a physician. Scribe utilized for documentation purposes only.  FHT = 150 - and reassuring.  Derwood Kaplan, MD 08/30/12 2100

## 2012-09-01 LAB — URINE CULTURE: Colony Count: 15000

## 2012-09-02 LAB — GC/CHLAMYDIA PROBE AMP, GENITAL: Chlamydia, DNA Probe: NEGATIVE

## 2012-09-28 ENCOUNTER — Emergency Department (HOSPITAL_COMMUNITY)
Admission: EM | Admit: 2012-09-28 | Discharge: 2012-09-28 | Disposition: A | Discharge status: Home / Self Care | Payer: Self-pay | Attending: Emergency Medicine | Admitting: Emergency Medicine

## 2012-09-28 ENCOUNTER — Encounter (HOSPITAL_COMMUNITY): Payer: Self-pay | Admitting: Emergency Medicine

## 2012-09-28 DIAGNOSIS — X58XXXA Exposure to other specified factors, initial encounter: Secondary | ICD-10-CM | POA: Insufficient documentation

## 2012-09-28 DIAGNOSIS — T7840XA Allergy, unspecified, initial encounter: Secondary | ICD-10-CM | POA: Insufficient documentation

## 2012-09-28 DIAGNOSIS — F17200 Nicotine dependence, unspecified, uncomplicated: Secondary | ICD-10-CM | POA: Insufficient documentation

## 2012-09-28 DIAGNOSIS — Z331 Pregnant state, incidental: Secondary | ICD-10-CM | POA: Insufficient documentation

## 2012-09-28 MED ORDER — TRIAMCINOLONE ACETONIDE 0.1 % EX CREA: TOPICAL_CREAM | Freq: Two times a day (BID) | CUTANEOUS | Status: DC

## 2012-09-28 MED ORDER — DIPHENHYDRAMINE HCL 25 MG PO CAPS: 25.0000 mg | ORAL_CAPSULE | Freq: Four times a day (QID) | ORAL | Status: DC | PRN

## 2012-09-28 NOTE — ED Notes (Signed)
Pt has sore areas over head. Says scabs form then shed and there is new sore underneath; reports they are painful and draining. Reports she has not used any hair products in several weeks.

## 2012-09-28 NOTE — ED Provider Notes (Signed)
History     CSN: [id number]  Arrival date & time 09/28/12  4098   First MD Initiated Contact with Patient 09/28/12 [phone]      Chief Complaint  Patient presents with  . Skin Ulcer    (Consider location/radiation/quality/duration/timing/severity/associated sxs/prior treatment) HPI Hx from pt. Krista Vaughn is a 26 y.o. female presenting with skin problem. She states that about a week ago, she noted ulcerations to her scalp with clear drainage and soreness. Areas are located primarily around the hairline. She denies any recent use of new hair products but did dye her hair about 3 weeks ago. She has had a similar reaction to hair products in the remote past. She denies noting any new lesions but states that the ones that are there have not significantly improved. She has been applying neosporin and taking ibuprofen without relief.  She is currently pregnant.  No past medical history on file.  No past surgical history on file.  No family history on file.  History  Substance Use Topics  . Smoking status: Current Some Day Smoker  . Smokeless tobacco: Not on file  . Alcohol Use: No    OB History    Grav Para Term Preterm Abortions TAB SAB Ect Mult Living   1               Review of Systems  Constitutional: Negative.  Negative for fever.  HENT: Negative for facial swelling and neck pain.   Skin: Positive for rash and wound.    Allergies  Review of patient's allergies indicates no known allergies.  Home Medications   Current Outpatient Rx  Name Route Sig Dispense Refill  . ALBUTEROL SULFATE HFA 108 (90 BASE) MCG/ACT IN AERS Inhalation Inhale 2 puffs into the lungs every 6 (six) hours as needed. Bronchial spasms    . DIPHENHYDRAMINE HCL 25 MG PO CAPS Oral Take 1 capsule (25 mg total) by mouth every 6 (six) hours as needed for itching. 30 capsule 0  . TRIAMCINOLONE ACETONIDE 0.1 % EX CREA Topical Apply topically 2 (two) times daily. 30 g 0    BP 101/60  Pulse 73   Temp 98.1 F (36.7 C) (Oral)  Resp 18  SpO2 100%  LMP 06/21/2012  Physical Exam  Nursing note and vitals reviewed. Constitutional: She appears well-developed and well-nourished. No distress.  HENT:  Head: Normocephalic and atraumatic.  Neck: Normal range of motion.  Cardiovascular: Normal rate.   Pulmonary/Chest: Effort normal.  Musculoskeletal: Normal range of motion.  Neurological: She is alert.  Skin: Skin is warm and dry. She is not diaphoretic.       Several small ulcerations present around hairline with serous appearing drainage noted, areas are ttp, no surrounding erythema to suggest cellulitis  Psychiatric: She has a normal mood and affect.    ED Course  Procedures (including critical care time)  Labs Reviewed - No data to display No results found.   1. Allergic reaction       MDM  Pt presents with ulcerations around hairline. She did get her hair dyed a few weeks ago and has had a similar rxn to hair dye in the past. This appears most c/w allergic rxn to chemicals. No evidence of secondary skin infx. Will treat with topical corticosteroids (will defer systemic steroids as she is currently pregnant). Advised to use benadryl as well q6h. To f/u with PCP if not improving. Reasons to return discussed.       Grant Fontana, PA-C  09/28/12 0839 

## 2012-09-29 NOTE — ED Provider Notes (Signed)
Medical screening examination/treatment/procedure(s) were performed by non-physician practitioner and as supervising physician I was immediately available for consultation/collaboration.  Marquisa Salih R. Minie Roadcap, MD 09/29/12 2137 

## 2012-11-05 ENCOUNTER — Emergency Department (HOSPITAL_COMMUNITY)
Admission: EM | Admit: 2012-11-05 | Discharge: 2012-11-05 | Disposition: A | Discharge status: Home / Self Care | Payer: Self-pay | Attending: Emergency Medicine | Admitting: Emergency Medicine

## 2012-11-05 ENCOUNTER — Encounter (HOSPITAL_COMMUNITY): Payer: Self-pay | Admitting: *Deleted

## 2012-11-05 DIAGNOSIS — K089 Disorder of teeth and supporting structures, unspecified: Secondary | ICD-10-CM | POA: Insufficient documentation

## 2012-11-05 DIAGNOSIS — K0889 Other specified disorders of teeth and supporting structures: Secondary | ICD-10-CM

## 2012-11-05 DIAGNOSIS — F17200 Nicotine dependence, unspecified, uncomplicated: Secondary | ICD-10-CM | POA: Insufficient documentation

## 2012-11-05 MED ORDER — AMOXICILLIN 500 MG PO CAPS: 500.0000 mg | ORAL_CAPSULE | Freq: Three times a day (TID) | ORAL | Status: DC

## 2012-11-05 MED ORDER — OXYCODONE-ACETAMINOPHEN 5-325 MG PO TABS: 1.0000 | ORAL_TABLET | Freq: Four times a day (QID) | ORAL | Status: DC | PRN

## 2012-11-05 NOTE — ED Provider Notes (Signed)
History     CSN: [id number]  Arrival date & time 11/05/12  1243   First MD Initiated Contact with Patient 11/05/12 1337      Chief Complaint  Patient presents with  . Dental Pain    (Consider location/radiation/quality/duration/timing/severity/associated sxs/prior treatment) HPI  Patient presents to the emergency department with a dental complaint. Symptomshave been on and off for two months. The patient has tried to alleviate pain with tylenol and ibuprofen.  Pain rated at a 10/10, characterized as throbbing in nature and located bilateral lower mid molars. Patient denies fever, night sweats, chills, difficulty swallowing or opening mouth, SOB, nuchal rigidity or decreased ROM of neck.  Patient does not have a dentist and requests a resource guide at discharge.   History reviewed. No pertinent past medical history.  Past Surgical History  Procedure Date  . Induced abortion     No family history on file.  History  Substance Use Topics  . Smoking status: Current Some Day Smoker  . Smokeless tobacco: Not on file  . Alcohol Use: No    OB History    Grav Para Term Preterm Abortions TAB SAB Ect Mult Living   1               Review of Systems   Review of Systems  Gen: no weight loss, fevers, chills, night sweats  Eyes: no discharge or drainage, no occular pain or visual changes  Nose: no epistaxis or rhinorrhea  Mouth: + dental pain, no sore throat  Neck: no neck pain  Lungs:No wheezing, coughing or hemoptysis CV: no chest pain, palpitations, dependent edema or orthopnea  Abd: no abdominal pain, nausea, vomiting  GU: no dysuria or gross hematuria  MSK:  No abnormalities  Neuro: no headache, no focal neurologic deficits  Skin: no abnormalities Psyche: negative.    Allergies  Review of patient's allergies indicates no known allergies.  Home Medications   Current Outpatient Rx  Name  Route  Sig  Dispense  Refill  . ACETAMINOPHEN 500 MG PO TABS   Oral  Take 500 mg by mouth every 6 (six) hours as needed. pain         . ALBUTEROL SULFATE HFA 108 (90 BASE) MCG/ACT IN AERS   Inhalation   Inhale 2 puffs into the lungs every 6 (six) hours as needed. Bronchial spasms         . IBUPROFEN 200 MG PO TABS   Oral   Take 200 mg by mouth every 6 (six) hours as needed. pain         . NAPROXEN SODIUM 220 MG PO TABS   Oral   Take 220 mg by mouth 2 (two) times daily with a meal. pain         . AMOXICILLIN 500 MG PO CAPS   Oral   Take 1 capsule (500 mg total) by mouth 3 (three) times daily.   21 capsule   0   . OXYCODONE-ACETAMINOPHEN 5-325 MG PO TABS   Oral   Take 1 tablet by mouth every 6 (six) hours as needed for pain.   15 tablet   0     BP 121/84  Pulse 77  Temp 98.9 F (37.2 C) (Oral)  Resp 18  SpO2 100%  LMP 10/15/2012  Breastfeeding? Unknown  Physical Exam  Constitutional: She appears well-developed and well-nourished. No distress.  HENT:  Head: Normocephalic and atraumatic.  Mouth/Throat: Uvula is midline, oropharynx is clear and moist and mucous membranes  are normal. Normal dentition. Dental caries (Pts tooth shows no obvious abscess but moderate to severe tenderness to palpation of marked tooth) present. No uvula swelling.    Eyes: Pupils are equal, round, and reactive to light.  Neck: Trachea normal, normal range of motion and full passive range of motion without pain. Neck supple.  Cardiovascular: Normal rate, regular rhythm, normal heart sounds and normal pulses.   Pulmonary/Chest: Effort normal and breath sounds normal. No respiratory distress. Chest wall is not dull to percussion. She exhibits no tenderness, no crepitus, no edema, no deformity and no retraction.  Abdominal: Normal appearance.  Musculoskeletal: Normal range of motion.  Neurological: She is alert. She has normal strength.  Skin: Skin is warm, dry and intact. She is not diaphoretic.  Psychiatric: She has a normal mood and affect. Her speech is  normal. Cognition and memory are normal.    ED Course  Procedures (including critical care time)  Labs Reviewed - No data to display No results found.   1. Toothache       MDM  Pt given Rx for Percocets 5-325 (10 tabs) and Penicillin. Patient informed that they need to find a dentist and have the tooth pulled or the symptoms may be reoccurring. A Resource guide has been given with dental providers. Patient has been given return to ED precautions.         Dorthula Matas, PA 11/05/12 1451

## 2012-11-05 NOTE — ED Notes (Signed)
Reports toothache, bil pain, will not get insurance until Feb. Does not have a dentist. Recent elective abortion in Sept, wanted to make sure it is documented

## 2012-11-08 NOTE — ED Provider Notes (Signed)
Medical screening examination/treatment/procedure(s) were performed by non-physician practitioner and as supervising physician I was immediately available for consultation/collaboration.   Anisah Kuck M Lenix Kidd, DO 11/08/12 1948 

## 2012-12-13 ENCOUNTER — Inpatient Hospital Stay (HOSPITAL_COMMUNITY): Payer: Self-pay

## 2012-12-13 ENCOUNTER — Encounter (HOSPITAL_COMMUNITY): Payer: Self-pay | Admitting: Family

## 2012-12-13 ENCOUNTER — Inpatient Hospital Stay (HOSPITAL_COMMUNITY)
Admission: AD | Admit: 2012-12-13 | Discharge: 2012-12-13 | Disposition: A | Discharge status: Home / Self Care | Payer: Self-pay | Source: Ambulatory Visit | Attending: Obstetrics and Gynecology | Admitting: Obstetrics and Gynecology

## 2012-12-13 DIAGNOSIS — O468X9 Other antepartum hemorrhage, unspecified trimester: Secondary | ICD-10-CM

## 2012-12-13 DIAGNOSIS — O418X90 Other specified disorders of amniotic fluid and membranes, unspecified trimester, not applicable or unspecified: Secondary | ICD-10-CM

## 2012-12-13 DIAGNOSIS — O200 Threatened abortion: Secondary | ICD-10-CM | POA: Insufficient documentation

## 2012-12-13 DIAGNOSIS — O210 Mild hyperemesis gravidarum: Secondary | ICD-10-CM | POA: Insufficient documentation

## 2012-12-13 DIAGNOSIS — O219 Vomiting of pregnancy, unspecified: Secondary | ICD-10-CM

## 2012-12-13 HISTORY — DX: Chlamydial infection, unspecified: A74.9

## 2012-12-13 HISTORY — DX: Bronchitis, not specified as acute or chronic: J40

## 2012-12-13 LAB — CBC
MCH: 29.4 pg (ref 26.0–34.0)
MCHC: 33.2 g/dL (ref 30.0–36.0)
Platelets: 228 10*3/uL (ref 150–400)
RDW: 12.9 % (ref 11.5–15.5)

## 2012-12-13 LAB — ABO/RH: ABO/RH(D): O POS

## 2012-12-13 MED ORDER — NALBUPHINE HCL 10 MG/ML IJ SOLN
10.0000 mg | INTRAMUSCULAR | Status: DC | PRN
  Administered 2012-12-13: 10 mg via INTRAMUSCULAR
  Filled 2012-12-13: qty 1

## 2012-12-13 MED ORDER — PROMETHAZINE HCL 25 MG/ML IJ SOLN
25.0000 mg | Freq: Once | INTRAVENOUS | Status: AC
  Administered 2012-12-13: 25 mg via INTRAVENOUS
  Filled 2012-12-13: qty 1

## 2012-12-13 MED ORDER — CONCEPT OB 130-92.4-1 MG PO CAPS: 1.0000 | ORAL_CAPSULE | Freq: Every day | ORAL | Status: DC

## 2012-12-13 NOTE — MAU Note (Addendum)
Patient had abortion  September 14  and was 4 months pregnant; had 2 days of bleeding at end of September; no bleeding since that time until today.  About two weeks ago, positive HPT. Today stomach started hurting at work and bleeding in toilet. EMS callled to work.

## 2012-12-13 NOTE — MAU Provider Note (Signed)
Chief Complaint: No chief complaint on file.  First provider contact initiated 12/13/12 at 1718.   SUBJECTIVE HPI: Krista Vaughn is a 26 y.o. [phone] at unknown weeks gestation who presents with heavy bleeding starting 20 minutes prior to calling EMS. Patient had abortion September 14, ~4 months pregnant; had 2 days of light eeding at end of September; no bleeding since that time until today. Positive HPT 2 weeks ago. No blood work or [place]. Has been sexually active since termination.    Past Medical History  Diagnosis Date  . Chlamydia   . Bronchitis    OB History    Grav Para Term Preterm Abortions TAB SAB Ect Mult Living   5 2 2  2 2  0        # Outc Date GA Lbr Len/2nd Wgt Sex Del Anes PTL Lv   1 TRM 2006     SVD      Comments: System Generated. Please review and update pregnancy details.   2 TRM 2007     SVD      3 TAB 2013           4 TAB 9/13           5 CUR              Past Surgical History  Procedure Date  . Induced abortion    History   Social History  . Marital Status: Single    Spouse Name: N/A    Number of Children: N/A  . Years of Education: N/A   Occupational History  . Not on file.   Social History Main Topics  . Smoking status: Current Some Day Smoker  . Smokeless tobacco: Not on file  . Alcohol Use: No  . Drug Use: No  . Sexually Active:    Other Topics Concern  . Not on file   Social History Narrative  . No narrative on file   No current facility-administered medications on file prior to encounter.   Current Outpatient Prescriptions on File Prior to Encounter  Medication Sig Dispense Refill  . acetaminophen (TYLENOL) 500 MG tablet Take 500 mg by mouth every 6 (six) hours as needed. pain      . albuterol (PROVENTIL HFA;VENTOLIN HFA) 108 (90 BASE) MCG/ACT inhaler Inhale 2 puffs into the lungs every 6 (six) hours as needed. Bronchial spasms       No Known Allergies  ROS: Pertinent items in HPI  OBJECTIVE Blood pressure 122/58, pulse  98, temperature 97.2 F (36.2 C), temperature source Oral, resp. rate 16, height 5' (1.524 m), last menstrual period 10/15/2012. GENERAL: Well-developed, well-nourished female in severe distress, writhing.  HEENT: Normocephalic HEART: normal rate RESP: normal effort ABDOMEN: Soft, non-tender. Fundus not palpated. EXTREMITIES: Nontender, no edema NEURO: Alert and oriented SPECULUM EXAM: NEFG, small amount of bright red blood noted coming from cervix, cervix clean except for possible nabothian cysts at 9 o'clock. BIMANUAL: cervix closed; uterus mildly enlarged, no adnexal tenderness or masses  LAB RESULTS Results for orders placed during the hospital encounter of 12/13/12 (from the past 24 hour(s))  ABO/RH     Status: Normal   Collection Time   12/13/12  5:24 PM      Component Value Range   ABO/RH(D) O POS    CBC     Status: Abnormal   Collection Time   12/13/12  5:24 PM      Component Value Range   WBC 10.9 (*) 4.0 - 10.5  K/uL   RBC 3.94  3.87 - 5.11 MIL/uL   Hemoglobin 11.6 (*) 12.0 - 15.0 g/dL   HCT 16.1 (*) 09.6 - 04.5 %   MCV 88.6  78.0 - 100.0 fL   MCH 29.4  26.0 - 34.0 pg   MCHC 33.2  30.0 - 36.0 g/dL   RDW 40.9  81.1 - 91.4 %   Platelets 228  150 - 400 K/uL    IMAGING 7.3 week SIUP, pos cardiac activity, small SCH.  MAU COURSE Pain and N/V resolved w/ Nubain and phenergan. Pt in NAD  ASSESSMENT 1. Threatened miscarriage   2. Nausea/vomiting in pregnancy   3. Subchorionic hemorrhage    PLAN Discharge home Bleeding/SAB precautions, pelvic rest     Follow-up Information    Follow up with [facility].      Follow up with [facility] [place] [facility]. (As needed if symptoms worsen)    Contact information:   [address] [id number] mc [place] [address] [phone]          Medication List     As of 12/13/2012  8:57 PM    STOP taking these medications         ibuprofen 200 MG tablet    Commonly known as: ADVIL,MOTRIN      naproxen sodium 220 MG tablet   Commonly known as: ANAPROX      TAKE these medications         acetaminophen 500 MG tablet   Commonly known as: TYLENOL   Take 500 mg by mouth every 6 (six) hours as needed. pain      albuterol 108 (90 BASE) MCG/ACT inhaler   Commonly known as: PROVENTIL HFA;VENTOLIN HFA   Inhale 2 puffs into the lungs every 6 (six) hours as needed. Bronchial spasms      CONCEPT OB 130-92.4-1 MG Caps   Take 1 tablet by mouth daily.        [place], CNM 12/13/2012  6:10 PM

## 2012-12-15 LAB — GC/CHLAMYDIA PROBE AMP
CT Probe RNA: NEGATIVE
GC Probe RNA: NEGATIVE

## 2012-12-16 NOTE — MAU Provider Note (Signed)
Attestation of Attending Supervision of Advanced Practitioner (CNM/NP): Evaluation and management procedures were performed by the Advanced Practitioner under my supervision and collaboration.  I have reviewed the Advanced Practitioner's note and chart, and I agree with the management and plan.  Zendaya Groseclose 12/16/2012 6:08 PM

## 2013-01-22 ENCOUNTER — Ambulatory Visit (INDEPENDENT_AMBULATORY_CARE_PROVIDER_SITE_OTHER): Payer: BC Managed Care – PPO | Admitting: Emergency Medicine

## 2013-01-22 VITALS — BP 103/61 | HR 92 | Temp 99.6°F | Resp 16 | Ht 61.0 in | Wt 126.2 lb

## 2013-01-22 DIAGNOSIS — J018 Other acute sinusitis: Secondary | ICD-10-CM

## 2013-01-22 DIAGNOSIS — R52 Pain, unspecified: Secondary | ICD-10-CM

## 2013-01-22 MED ORDER — AMOXICILLIN-POT CLAVULANATE 875-125 MG PO TABS: 1.0000 | ORAL_TABLET | Freq: Two times a day (BID) | ORAL | Status: DC

## 2013-01-22 NOTE — Patient Instructions (Signed)

## 2013-01-22 NOTE — Progress Notes (Signed)
[facility] [address], [place] [address] [phone]  Date:  01/22/2013   Name:  Krista Vaughn   DOB:  09-Jan-1986   MRN:  [id number]  PCP:  Default, Provider, MD    Chief Complaint: Sore Throat, Fever and Generalized Body Aches   History of Present Illness:  Krista Vaughn is a 27 y.o. very pleasant female patient who presents with the following:  Ill two days with neck pain, fever, chills, and non productive cough.  Has myalgias, fatigue and arthralgias.  Has green nasal discharge.  No wheezing or shortness of breath.  No flu shot.  Ill contacts.    There is no problem list on file for this patient.   Past Medical History  Diagnosis Date  . Chlamydia   . Bronchitis     Past Surgical History  Procedure Date  . Induced abortion   . Therapeutic abortion     History  Substance Use Topics  . Smoking status: Current Some Day Smoker  . Smokeless tobacco: Not on file  . Alcohol Use: No    History reviewed. No pertinent family history.  No Known Allergies  Medication list has been reviewed and updated.  Current Outpatient Prescriptions on File Prior to Visit  Medication Sig Dispense Refill  . acetaminophen (TYLENOL) 500 MG tablet Take 500 mg by mouth every 6 (six) hours as needed. pain      . albuterol (PROVENTIL HFA;VENTOLIN HFA) 108 (90 BASE) MCG/ACT inhaler Inhale 2 puffs into the lungs every 6 (six) hours as needed. Bronchial spasms      . Prenat w/o A Vit-FeFum-FePo-FA (CONCEPT OB) 130-92.4-1 MG CAPS Take 1 tablet by mouth daily.  30 capsule  12    Review of Systems:  As per HPI, otherwise negative.    Physical Examination: Filed Vitals:   01/22/13 1654  BP: 103/61  Pulse: 92  Temp: 99.6 F (37.6 C)  Resp: 16   Filed Vitals:   01/22/13 1654  Height: 5\' 1"  (1.549 m)  Weight: 126 lb 3.2 oz (57.244 kg)   Body mass index is 23.85 kg/(m^2). Ideal Body Weight: Weight in (lb) to have BMI = 25: 132   GEN: WDWN, NAD,  Non-toxic, A & O x 3 HEENT: Atraumatic, Normocephalic. Neck supple. No masses, No LAD. Ears and Nose: No external deformity. CV: RRR, No M/G/R. No JVD. No thrill. No extra heart sounds. PULM: CTA B, no wheezes, crackles, rhonchi. No retractions. No resp. distress. No accessory muscle use. ABD: S, NT, ND, +BS. No rebound. No HSM. EXTR: No c/c/e NEURO Normal gait.  PSYCH: Normally interactive. Conversant. Not depressed or anxious appearing.  Calm demeanor.    Assessment and Plan: Sinusitis augmentin mucinex  Carmelina Dane, MD  Results for orders placed in visit on 01/22/13  POCT INFLUENZA A/B      Component Value Range   Influenza A, POC Negative     Influenza B, POC Negative

## 2013-06-08 ENCOUNTER — Emergency Department (HOSPITAL_COMMUNITY)
Admission: EM | Admit: 2013-06-08 | Discharge: 2013-06-08 | Disposition: A | Discharge status: Home / Self Care | Payer: No Typology Code available for payment source | Attending: Emergency Medicine | Admitting: Emergency Medicine

## 2013-06-08 ENCOUNTER — Emergency Department (HOSPITAL_COMMUNITY): Payer: No Typology Code available for payment source

## 2013-06-08 ENCOUNTER — Encounter (HOSPITAL_COMMUNITY): Payer: Self-pay | Admitting: *Deleted

## 2013-06-08 DIAGNOSIS — Z8619 Personal history of other infectious and parasitic diseases: Secondary | ICD-10-CM | POA: Insufficient documentation

## 2013-06-08 DIAGNOSIS — S139XXA Sprain of joints and ligaments of unspecified parts of neck, initial encounter: Secondary | ICD-10-CM | POA: Insufficient documentation

## 2013-06-08 DIAGNOSIS — S4980XA Other specified injuries of shoulder and upper arm, unspecified arm, initial encounter: Secondary | ICD-10-CM | POA: Insufficient documentation

## 2013-06-08 DIAGNOSIS — F17200 Nicotine dependence, unspecified, uncomplicated: Secondary | ICD-10-CM | POA: Insufficient documentation

## 2013-06-08 DIAGNOSIS — Z8709 Personal history of other diseases of the respiratory system: Secondary | ICD-10-CM | POA: Insufficient documentation

## 2013-06-08 DIAGNOSIS — Z3202 Encounter for pregnancy test, result negative: Secondary | ICD-10-CM | POA: Insufficient documentation

## 2013-06-08 DIAGNOSIS — Y92410 Unspecified street and highway as the place of occurrence of the external cause: Secondary | ICD-10-CM | POA: Insufficient documentation

## 2013-06-08 DIAGNOSIS — S46909A Unspecified injury of unspecified muscle, fascia and tendon at shoulder and upper arm level, unspecified arm, initial encounter: Secondary | ICD-10-CM | POA: Insufficient documentation

## 2013-06-08 DIAGNOSIS — S161XXA Strain of muscle, fascia and tendon at neck level, initial encounter: Secondary | ICD-10-CM

## 2013-06-08 DIAGNOSIS — Y9389 Activity, other specified: Secondary | ICD-10-CM | POA: Insufficient documentation

## 2013-06-08 MED ORDER — OXYCODONE-ACETAMINOPHEN 5-325 MG PO TABS
1.0000 | ORAL_TABLET | Freq: Once | ORAL | Status: AC
  Administered 2013-06-08: 1 via ORAL
  Filled 2013-06-08: qty 1

## 2013-06-08 MED ORDER — IBUPROFEN 600 MG PO TABS: 600.0000 mg | ORAL_TABLET | Freq: Four times a day (QID) | ORAL | Status: DC | PRN

## 2013-06-08 NOTE — ED Notes (Signed)
The patient is AOx4 and comfortable with the discharge instructions. 
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
## 2013-08-30 ENCOUNTER — Inpatient Hospital Stay (HOSPITAL_COMMUNITY): Payer: BC Managed Care – PPO

## 2013-08-30 ENCOUNTER — Encounter (HOSPITAL_COMMUNITY): Payer: Self-pay

## 2013-08-30 ENCOUNTER — Inpatient Hospital Stay (HOSPITAL_COMMUNITY)
Admission: AD | Admit: 2013-08-30 | Discharge: 2013-08-30 | Disposition: A | Discharge status: Home / Self Care | Payer: BC Managed Care – PPO | Source: Ambulatory Visit | Attending: Obstetrics & Gynecology | Admitting: Obstetrics & Gynecology

## 2013-08-30 DIAGNOSIS — B373 Candidiasis of vulva and vagina: Secondary | ICD-10-CM

## 2013-08-30 DIAGNOSIS — O99891 Other specified diseases and conditions complicating pregnancy: Secondary | ICD-10-CM | POA: Insufficient documentation

## 2013-08-30 DIAGNOSIS — O2390 Unspecified genitourinary tract infection in pregnancy, unspecified trimester: Secondary | ICD-10-CM | POA: Insufficient documentation

## 2013-08-30 DIAGNOSIS — R109 Unspecified abdominal pain: Secondary | ICD-10-CM | POA: Insufficient documentation

## 2013-08-30 DIAGNOSIS — Z3490 Encounter for supervision of normal pregnancy, unspecified, unspecified trimester: Secondary | ICD-10-CM

## 2013-08-30 DIAGNOSIS — B3731 Acute candidiasis of vulva and vagina: Secondary | ICD-10-CM | POA: Insufficient documentation

## 2013-08-30 DIAGNOSIS — K219 Gastro-esophageal reflux disease without esophagitis: Secondary | ICD-10-CM | POA: Insufficient documentation

## 2013-08-30 HISTORY — DX: Anxiety disorder, unspecified: F41.9

## 2013-08-30 HISTORY — DX: Depression, unspecified: F32.A

## 2013-08-30 HISTORY — DX: Major depressive disorder, single episode, unspecified: F32.9

## 2013-08-30 LAB — URINALYSIS, ROUTINE W REFLEX MICROSCOPIC
Bilirubin Urine: NEGATIVE
Glucose, UA: NEGATIVE mg/dL
Ketones, ur: NEGATIVE mg/dL
Nitrite: NEGATIVE
Specific Gravity, Urine: 1.015 (ref 1.005–1.030)
pH: 7.5 (ref 5.0–8.0)

## 2013-08-30 LAB — URINE MICROSCOPIC-ADD ON

## 2013-08-30 LAB — CBC
Hemoglobin: 11.8 g/dL — ABNORMAL LOW (ref 12.0–15.0)
MCV: 86 fL (ref 78.0–100.0)
Platelets: 217 10*3/uL (ref 150–400)
RBC: 4.07 MIL/uL (ref 3.87–5.11)
WBC: 7.8 10*3/uL (ref 4.0–10.5)

## 2013-08-30 LAB — WET PREP, GENITAL

## 2013-08-30 MED ORDER — GI COCKTAIL ~~LOC~~
30.0000 mL | Freq: Once | ORAL | Status: AC
  Administered 2013-08-30: 30 mL via ORAL
  Filled 2013-08-30: qty 30

## 2013-08-30 MED ORDER — FLUCONAZOLE 150 MG PO TABS
150.0000 mg | ORAL_TABLET | Freq: Once | ORAL | Status: AC
  Administered 2013-08-30: 150 mg via ORAL
  Filled 2013-08-30: qty 1

## 2013-08-30 MED ORDER — RANITIDINE HCL 150 MG PO TABS: 150.0000 mg | ORAL_TABLET | Freq: Two times a day (BID) | ORAL | Status: DC

## 2013-08-30 NOTE — MAU Provider Note (Signed)
History     CSN: [id number]  Arrival date and time: 08/30/13 1012   None     Chief Complaint  Patient presents with  . Abdominal Cramping  . Morning Sickness   HPI  Ms. Krista Vaughn is a 27 y.o. female [phone]; unknown gestation who presents for possible pregnancy, abdominal cramping, morning sickness and heartburn. [facility] end of July and had a pregnancy blood test done that was negative. Her LMP was the end of June; no period in July and No period in August. After no period in August she repeated a home pregnancy test and it was positive. She has been experiencing off and on left lower abdominal cramps and heart burn that is worse at night. Has been taking Tums a lot every day. She feels her nausea is related to her heartburn.   OB History   Grav Para Term Preterm Abortions TAB SAB Ect Mult Living   6 2 2  3 3  0   2      Past Medical History  Diagnosis Date  . Chlamydia   . Bronchitis   . Depression   . Anxiety     Past Surgical History  Procedure Laterality Date  . Induced abortion    . Therapeutic abortion      History reviewed. No pertinent family history.  History  Substance Use Topics  . Smoking status: Current Some Day Smoker    Types: Cigarettes  . Smokeless tobacco: Never Used  . Alcohol Use: No    Allergies: No Known Allergies  Prescriptions prior to admission  Medication Sig Dispense Refill  . acetaminophen (TYLENOL) 325 MG tablet Take 650 mg by mouth every 6 (six) hours as needed for pain.      . calcium carbonate (TUMS - DOSED IN MG ELEMENTAL CALCIUM) 500 MG chewable tablet Chew 2 tablets by mouth 3 (three) times daily.      . DiphenhydrAMINE HCl (BENADRYL ALLERGY PO) Take 1 tablet by mouth daily as needed (allergies).      Marland Kitchen ibuprofen (ADVIL,MOTRIN) 600 MG tablet Take 1 tablet (600 mg total) by mouth every 6 (six) hours as needed for pain.  30 tablet  0   Results for orders placed during the hospital  encounter of 08/30/13 (from the past 24 hour(s))  URINALYSIS, ROUTINE W REFLEX MICROSCOPIC     Status: Abnormal   Collection Time    08/30/13 10:15 AM      Result Value Range   Color, Urine YELLOW  YELLOW   APPearance CLEAR  CLEAR   Specific Gravity, Urine 1.015  1.005 - 1.030   pH 7.5  5.0 - 8.0   Glucose, UA NEGATIVE  NEGATIVE mg/dL   Hgb urine dipstick TRACE (*) NEGATIVE   Bilirubin Urine NEGATIVE  NEGATIVE   Ketones, ur NEGATIVE  NEGATIVE mg/dL   Protein, ur NEGATIVE  NEGATIVE mg/dL   Urobilinogen, UA 0.2  0.0 - 1.0 mg/dL   Nitrite NEGATIVE  NEGATIVE   Leukocytes, UA MODERATE (*) NEGATIVE  URINE MICROSCOPIC-ADD ON     Status: Abnormal   Collection Time    08/30/13 10:15 AM      Result Value Range   Squamous Epithelial / LPF FEW (*) RARE   WBC, UA 11-20  <3 WBC/hpf   RBC / HPF 0-2  <3 RBC/hpf   Bacteria, UA MANY (*) RARE  POCT PREGNANCY, URINE     Status: Abnormal   Collection Time  08/30/13 10:25 AM      Result Value Range   Preg Test, Ur POSITIVE (*) NEGATIVE  CBC     Status: Abnormal   Collection Time    08/30/13 11:30 AM      Result Value Range   WBC 7.8  4.0 - 10.5 K/uL   RBC 4.07  3.87 - 5.11 MIL/uL   Hemoglobin 11.8 (*) 12.0 - 15.0 g/dL   HCT 16.1 (*) 09.6 - 04.5 %   MCV 86.0  78.0 - 100.0 fL   MCH 29.0  26.0 - 34.0 pg   MCHC 33.7  30.0 - 36.0 g/dL   RDW 40.9  81.1 - 91.4 %   Platelets 217  150 - 400 K/uL  HCG, QUANTITATIVE, PREGNANCY     Status: Abnormal   Collection Time    08/30/13 11:30 AM      Result Value Range   hCG, Beta Chain, Quant, S 47466 (*) <5 mIU/mL  WET PREP, GENITAL     Status: Abnormal   Collection Time    08/30/13 11:43 AM      Result Value Range   Yeast Wet Prep HPF POC FEW (*) NONE SEEN   Trich, Wet Prep NONE SEEN  NONE SEEN   Clue Cells Wet Prep HPF POC NONE SEEN  NONE SEEN   WBC, Wet Prep HPF POC FEW (*) NONE SEEN   US Ob Comp Less 14 Wks  08/30/2013   *RADIOLOGY REPORT*  Clinical Data: Positive pregnancy test.  Left lower  quadrant pain.  OBSTETRIC <14 WK ULTRASOUND  Technique:  Transabdominal ultrasound was performed for evaluation of the gestation as well as the maternal uterus and adnexal regions.  Comparison:  OB ultrasound 12/13/2012.  Intrauterine gestational sac: Single, ovoid in shape. Yolk sac: Present. Embryo: Present. Cardiac Activity: Present. Heart Rate: 118 bpm  CRL:  4.3 mm  6 w  1 d        [place] EDC: 04/24/2014.  Maternal uterus/Adnexae: There is a small amount of heterogeneously hypoechoic material adjacent to the gestational sac in the fundal portion of the uterus, compatible with a small volume of subchorionic hemorrhage. Within the right ovary there is a heterogeneously hypoechoic lesion, likely to represent a degenerating corpus luteum cyst.  The left ovary is unremarkable in appearance.  No significant free fluid the cul-de-sac.  IMPRESSION: 1.  Single viable IUP with estimated gestational age of [redacted] weeks and 1 day and fetal heart rate of 118 beats per minute. 2.  Small volume of subchorionic hemorrhage. 3.  Probable degenerating corpus luteum cyst in the right ovary incidentally noted.   Original Report Authenticated By: Trudie Reed, M.D.      Review of Systems  Constitutional: Negative for fever and chills.  Gastrointestinal: Positive for heartburn, nausea and abdominal pain. Negative for vomiting, diarrhea and constipation.       Left lower abdominal cramps.  Heart burn is worse at night when she lays down   Genitourinary: Negative for frequency and flank pain.       Was recently treated for UTI the end of July.  Vaginal discharge: white, cloudy, mild odor  No vaginal bleeding: 2 weeks ago noticed some spotting on her tissue  No dysuria.   Neurological: Positive for headaches. Negative for dizziness.  Psychiatric/Behavioral: Negative for substance abuse.   Physical Exam   Blood pressure 116/62, pulse 96, temperature 98.5 F (36.9 C), temperature source Oral, resp. rate 16, height 5'  (1.524 m), weight 55.566 kg (  122 lb 8 oz), last menstrual period 06/19/2013, not currently breastfeeding.  Physical Exam  Constitutional: She is oriented to person, place, and time. She appears well-developed and well-nourished. No distress.  HENT:  Head: Normocephalic.  Neck: Neck supple.  Respiratory: Effort normal.  GI: Soft. She exhibits no distension. There is tenderness. There is no rebound and no guarding.   left lower quadrant tenderness on exam   Genitourinary: Vaginal discharge found.  Speculum exam: Vagina - moderate amount of creamy, white, thick discharge, no odor Cervix - No contact bleeding, some tenderness at cervix with swab insertion  Bimanual exam: Cervix closed Uterus non tender, normal size Left and mid Adnexal tenderness , no masses bilaterally GC/Chlam, wet prep done Chaperone present for exam.   Neurological: She is alert and oriented to person, place, and time.  Skin: Skin is warm. She is not diaphoretic.    MAU Course  Procedures  Doppler heart tones attempted in MAU: unable to obtain. UA Gi cocktail  Wet prep GC/Chlamydia- pending  CBC Beta Hcg [place]   Assessment and Plan  A: Intrauterine pregnancy- US done 08/30/2013 Vulvovaginal candidiasis  GERD  P: Discharge home Diflucan 150 mg PO times 1 dose in MAU Start prenatal care as soon as possible  RX: Zantac 150 mg PO BID (#60) 1 RF Your pregnancy test is positive.  No smoking, no drugs, no alcohol.  Take a prenatal vitamin one by mouth every day.  Eat small frequent snacks to avoid nausea.  Begin prenatal care as soon as possible. Pregnancy verification letter provided to patient   Debbrah Alar FNP-C 08/30/2013, 1:40 PM

## 2013-08-30 NOTE — MAU Note (Signed)
Also notes burning and urgency with voiding for past week.

## 2013-08-30 NOTE — MAU Note (Signed)
Pt states no menses in June. Went to [facility] in July and had blood pregnancy test done which was negative. Took home upt in August which was positive. Usually has regular cycles around the 20th of each month. Has been nauseated, and taking tums for acid reflux. Having cramps intermittently on left side only for "some weeks". Has been taking tylenol and ibuprofen intermittently for pain. Notes cloudy, odorous discharge at times intermittently for past month.

## 2013-08-31 LAB — URINE CULTURE: Colony Count: 15000

## 2013-10-07 ENCOUNTER — Inpatient Hospital Stay (HOSPITAL_COMMUNITY)
Admission: AD | Admit: 2013-10-07 | Discharge: 2013-10-07 | Disposition: A | Discharge status: Home / Self Care | Payer: BC Managed Care – PPO | Source: Ambulatory Visit | Attending: Obstetrics and Gynecology | Admitting: Obstetrics and Gynecology

## 2013-10-07 ENCOUNTER — Encounter (HOSPITAL_COMMUNITY): Payer: Self-pay | Admitting: *Deleted

## 2013-10-07 DIAGNOSIS — B373 Candidiasis of vulva and vagina: Secondary | ICD-10-CM | POA: Insufficient documentation

## 2013-10-07 DIAGNOSIS — R51 Headache: Secondary | ICD-10-CM | POA: Insufficient documentation

## 2013-10-07 DIAGNOSIS — L293 Anogenital pruritus, unspecified: Secondary | ICD-10-CM | POA: Insufficient documentation

## 2013-10-07 DIAGNOSIS — A599 Trichomoniasis, unspecified: Secondary | ICD-10-CM

## 2013-10-07 DIAGNOSIS — A5901 Trichomonal vulvovaginitis: Secondary | ICD-10-CM | POA: Insufficient documentation

## 2013-10-07 DIAGNOSIS — B3731 Acute candidiasis of vulva and vagina: Secondary | ICD-10-CM | POA: Insufficient documentation

## 2013-10-07 DIAGNOSIS — R109 Unspecified abdominal pain: Secondary | ICD-10-CM | POA: Insufficient documentation

## 2013-10-07 HISTORY — DX: Migraine, unspecified, not intractable, without status migrainosus: G43.909

## 2013-10-07 LAB — URINALYSIS, ROUTINE W REFLEX MICROSCOPIC
Hgb urine dipstick: NEGATIVE
Nitrite: NEGATIVE
Specific Gravity, Urine: >1.03 — ABNORMAL HIGH (ref 1.005–1.030)
Urobilinogen, UA: 0.2 mg/dL (ref 0.0–1.0)
pH: 6 (ref 5.0–8.0)

## 2013-10-07 LAB — URINE MICROSCOPIC-ADD ON

## 2013-10-07 LAB — WET PREP, GENITAL

## 2013-10-07 MED ORDER — METOCLOPRAMIDE HCL 5 MG/ML IJ SOLN
10.0000 mg | Freq: Once | INTRAMUSCULAR | Status: AC
  Administered 2013-10-07: 10 mg via INTRAVENOUS
  Filled 2013-10-07: qty 2

## 2013-10-07 MED ORDER — FLUCONAZOLE 150 MG PO TABS
150.0000 mg | ORAL_TABLET | Freq: Every day | ORAL | Status: DC
  Administered 2013-10-07: 150 mg via ORAL
  Filled 2013-10-07: qty 1

## 2013-10-07 MED ORDER — BUTALBITAL-APAP-CAFFEINE 50-325-40 MG PO TABS: 1.0000 | ORAL_TABLET | Freq: Four times a day (QID) | ORAL | Status: DC | PRN

## 2013-10-07 MED ORDER — DEXAMETHASONE SODIUM PHOSPHATE 10 MG/ML IJ SOLN
10.0000 mg | Freq: Once | INTRAMUSCULAR | Status: AC
  Administered 2013-10-07: 10 mg via INTRAVENOUS

## 2013-10-07 MED ORDER — METRONIDAZOLE 500 MG PO TABS
2000.0000 mg | ORAL_TABLET | Freq: Once | ORAL | Status: AC
  Administered 2013-10-07: 2000 mg via ORAL
  Filled 2013-10-07: qty 4

## 2013-10-07 MED ORDER — DIPHENHYDRAMINE HCL 50 MG/ML IJ SOLN
25.0000 mg | Freq: Once | INTRAMUSCULAR | Status: AC
  Administered 2013-10-07: 25 mg via INTRAVENOUS
  Filled 2013-10-07: qty 1

## 2013-10-07 MED ORDER — SODIUM CHLORIDE 0.9 % IV SOLN
INTRAVENOUS | Status: DC
  Administered 2013-10-07: 21:00:00 via INTRAVENOUS

## 2013-10-07 NOTE — MAU Provider Note (Signed)
History     CSN: [id number]  Arrival date and time: 10/07/13 4098   First Provider Initiated Contact with Patient 10/07/13 2016      Chief Complaint  Patient presents with  . Abdominal Cramping  . Headache   HPI Ms. Krista Vaughn is a 27 y.o. [id number] at [redacted]w[redacted]d who presents to MAU today with complaint of lower abdominal cramping and headache since this morning. The patient rates her cramping at 8/10 now. She has not taken any pain medication. She denies vaginal bleeding, but has a thick, white discharge that is causing itching x 2 days. She rates her headache pain at 10/10 now. She denies blurred vision, fever, N/V/D or constipation.   OB History   Grav Para Term Preterm Abortions TAB SAB Ect Mult Living   5 2 2  2 2  0   2      Past Medical History  Diagnosis Date  . Chlamydia   . Bronchitis   . Depression   . Anxiety   . Migraine     Past Surgical History  Procedure Laterality Date  . Induced abortion    . Therapeutic abortion      History reviewed. No pertinent family history.  History  Substance Use Topics  . Smoking status: Current Some Day Smoker    Types: Cigarettes  . Smokeless tobacco: Never Used  . Alcohol Use: No    Allergies: No Known Allergies  No prescriptions prior to admission    Review of Systems  Constitutional: Negative for fever and malaise/fatigue.  Gastrointestinal: Positive for abdominal pain. Negative for nausea, vomiting, diarrhea and constipation.  Genitourinary: Negative for dysuria, urgency and frequency.       Neg - vaginal bleeding + vaginal discharge  Neurological: Positive for headaches.   Physical Exam   Blood pressure 95/50, pulse 103, temperature 98.6 F (37 C), temperature source Oral, resp. rate 18, last menstrual period 06/19/2013, SpO2 99.00%, not currently breastfeeding.  Physical Exam  Constitutional: She is oriented to person, place, and time. She appears well-developed and well-nourished. No distress.   HENT:  Head: Normocephalic and atraumatic.  Cardiovascular: Normal rate, regular rhythm and normal heart sounds.   Respiratory: Effort normal and breath sounds normal. No respiratory distress.  GI: Soft. Bowel sounds are normal. She exhibits no distension and no mass. There is tenderness (mild to moderate tenderness to palpation of the lower abdomen more prominent in the LLQ). There is no rebound and no guarding.  Neurological: She is alert and oriented to person, place, and time.  Skin: Skin is warm and dry. No erythema.  Psychiatric: She has a normal mood and affect.   Results for orders placed during the hospital encounter of 10/07/13 (from the past 24 hour(s))  URINALYSIS, ROUTINE W REFLEX MICROSCOPIC     Status: Abnormal   Collection Time    10/07/13  8:00 PM      Result Value Range   Color, Urine YELLOW  YELLOW   APPearance CLEAR  CLEAR   Specific Gravity, Urine >1.030 (*) 1.005 - 1.030   pH 6.0  5.0 - 8.0   Glucose, UA NEGATIVE  NEGATIVE mg/dL   Hgb urine dipstick NEGATIVE  NEGATIVE   Bilirubin Urine NEGATIVE  NEGATIVE   Ketones, ur 15 (*) NEGATIVE mg/dL   Protein, ur NEGATIVE  NEGATIVE mg/dL   Urobilinogen, UA 0.2  0.0 - 1.0 mg/dL   Nitrite NEGATIVE  NEGATIVE   Leukocytes, UA SMALL (*) NEGATIVE  URINE MICROSCOPIC-ADD ON  Status: None   Collection Time    10/07/13  8:00 PM      Result Value Range   Squamous Epithelial / LPF RARE  RARE   WBC, UA 0-2  <3 WBC/hpf   RBC / HPF 3-6  <3 RBC/hpf   Bacteria, UA RARE  RARE   Urine-Other MUCOUS PRESENT    WET PREP, GENITAL     Status: Abnormal   Collection Time    10/07/13  8:25 PM      Result Value Range   Yeast Wet Prep HPF POC NONE SEEN  NONE SEEN   Trich, Wet Prep FEW (*) NONE SEEN   Clue Cells Wet Prep HPF POC MODERATE (*) NONE SEEN   WBC, Wet Prep HPF POC MODERATE (*) NONE SEEN    MAU Course  Procedures None  MDM +FHTs today UA shows signs of dehydration Wet prep and GC/Chlamydia today NS IV with Benadryl,  Decadron and Reglan today for headache 2 G Flagyl and 150 mg Diflucan given in MAU Assessment and Plan  A: Yeast vulvovaginitis, clinical Trichomonas Headache  P: Discharge home Rx for Fioricet sent to patient's pharmacy Partner treatment advised Patient advised to increase PO hydration as tolerated Patient may return to MAU as needed or if her condition were to change or worsen  Freddi Starr, PA-C  10/07/2013, 11:09 PM

## 2013-10-07 NOTE — MAU Note (Signed)
Patient presents with complaints of abdominal cramping since this morning around 9 am. She has also had a headache for the last 3 hours. Has not taken any medication for the headache.

## 2013-10-08 LAB — GC/CHLAMYDIA PROBE AMP: CT Probe RNA: NEGATIVE

## 2013-10-15 NOTE — MAU Provider Note (Signed)
Attestation of Attending Supervision of Advanced Practitioner: Evaluation and management procedures were performed by the PA/NP/CNM/OB Fellow under my supervision/collaboration. Chart reviewed and agree with management and plan.  Rosabelle Jupin V 10/15/2013 10:31 PM   

## 2013-10-26 ENCOUNTER — Inpatient Hospital Stay (HOSPITAL_COMMUNITY)
Admission: AD | Admit: 2013-10-26 | Discharge: 2013-10-26 | Disposition: A | Discharge status: Home / Self Care | Payer: BC Managed Care – PPO | Source: Ambulatory Visit | Attending: Obstetrics & Gynecology | Admitting: Obstetrics & Gynecology

## 2013-10-26 ENCOUNTER — Encounter (HOSPITAL_COMMUNITY): Payer: Self-pay

## 2013-10-26 DIAGNOSIS — B373 Candidiasis of vulva and vagina: Secondary | ICD-10-CM | POA: Insufficient documentation

## 2013-10-26 DIAGNOSIS — N949 Unspecified condition associated with female genital organs and menstrual cycle: Secondary | ICD-10-CM | POA: Insufficient documentation

## 2013-10-26 DIAGNOSIS — R109 Unspecified abdominal pain: Secondary | ICD-10-CM | POA: Insufficient documentation

## 2013-10-26 DIAGNOSIS — B3731 Acute candidiasis of vulva and vagina: Secondary | ICD-10-CM | POA: Insufficient documentation

## 2013-10-26 DIAGNOSIS — O2390 Unspecified genitourinary tract infection in pregnancy, unspecified trimester: Secondary | ICD-10-CM | POA: Insufficient documentation

## 2013-10-26 LAB — URINALYSIS, ROUTINE W REFLEX MICROSCOPIC
Protein, ur: NEGATIVE mg/dL
Urobilinogen, UA: 0.2 mg/dL (ref 0.0–1.0)

## 2013-10-26 LAB — WET PREP, GENITAL

## 2013-10-26 MED ORDER — FLUCONAZOLE 150 MG PO TABS
150.0000 mg | ORAL_TABLET | Freq: Every day | ORAL | Status: DC
  Administered 2013-10-26: 150 mg via ORAL
  Filled 2013-10-26: qty 1

## 2013-10-26 MED ORDER — FLUCONAZOLE 150 MG PO TABS: 150.0000 mg | ORAL_TABLET | Freq: Every day | ORAL | Status: DC

## 2013-10-26 NOTE — MAU Note (Signed)
Patient is in with c/o white non-odorous vaginal discharge for a week. She states that she started having lower abdominal cramping about 2 days ago. She denies vaginal bleeding. fht obtained.

## 2013-10-26 NOTE — MAU Note (Signed)
Patient states she has had abdominal cramping and a thick white vaginal discharge for a couple of days. Denies bleeding, nausea or vomiting.

## 2013-10-26 NOTE — MAU Provider Note (Signed)
History     CSN: [id number]  Arrival date and time: 10/26/13 1103   First Provider Initiated Contact with Patient 10/26/13 1138      Chief Complaint  Patient presents with  . Abdominal Cramping  . Vaginal Discharge   HPI  Ms. Krista Vaughn is a 27 y.o. female [phone] at [redacted]w[redacted]d who presents with abdominal cramping and vaginal discharge. She was here on 10/07/2013 and was told she had trichomonas and BV; she was treated with a 1 time dose of flagyl. She feels like the symptoms got better for a while and then returned. The vaginal discharge is non-odorous, thick, chunky and white. + lower abdominal cramping that feels similar to what she had two weeks ago.  She [facility].   OB History   Grav Para Term Preterm Abortions TAB SAB Ect Mult Living   5 2 2  2 2  0   2      Past Medical History  Diagnosis Date  . Chlamydia   . Bronchitis   . Depression   . Anxiety   . Migraine     Past Surgical History  Procedure Laterality Date  . Induced abortion    . Therapeutic abortion      History reviewed. No pertinent family history.  History  Substance Use Topics  . Smoking status: Current Some Day Smoker    Types: Cigarettes  . Smokeless tobacco: Never Used  . Alcohol Use: No    Allergies: No Known Allergies  Prescriptions prior to admission  Medication Sig Dispense Refill  . acetaminophen (TYLENOL) 325 MG tablet Take 650 mg by mouth every 6 (six) hours as needed for pain.      . butalbital-acetaminophen-caffeine (FIORICET) 50-325-40 MG per tablet Take 1-2 tablets by mouth every 6 (six) hours as needed for headache.  20 tablet  0  . calcium carbonate (TUMS - DOSED IN MG ELEMENTAL CALCIUM) 500 MG chewable tablet Chew 2 tablets by mouth 3 (three) times daily.      . DiphenhydrAMINE HCl (BENADRYL ALLERGY PO) Take 1 tablet by mouth daily as needed (allergies).      . ranitidine (ZANTAC) 150 MG tablet Take 1 tablet (150 mg total) by  mouth 2 (two) times daily.  60 tablet  1   Results for orders placed during the hospital encounter of 10/26/13 (from the past 24 hour(s))  URINALYSIS, ROUTINE W REFLEX MICROSCOPIC     Status: Abnormal   Collection Time    10/26/13 11:30 AM      Result Value Range   Color, Urine YELLOW  YELLOW   APPearance CLOUDY (*) CLEAR   Specific Gravity, Urine 1.020  1.005 - 1.030   pH 7.5  5.0 - 8.0   Glucose, UA NEGATIVE  NEGATIVE mg/dL   Hgb urine dipstick NEGATIVE  NEGATIVE   Bilirubin Urine NEGATIVE  NEGATIVE   Ketones, ur NEGATIVE  NEGATIVE mg/dL   Protein, ur NEGATIVE  NEGATIVE mg/dL   Urobilinogen, UA 0.2  0.0 - 1.0 mg/dL   Nitrite NEGATIVE  NEGATIVE   Leukocytes, UA SMALL (*) NEGATIVE  URINE MICROSCOPIC-ADD ON     Status: Abnormal   Collection Time    10/26/13 11:30 AM      Result Value Range   Squamous Epithelial / LPF MANY (*) RARE   WBC, UA 0-2  <3 WBC/hpf   Bacteria, UA FEW (*) RARE  WET PREP, GENITAL     Status: Abnormal  Collection Time    10/26/13 11:55 AM      Result Value Range   Yeast Wet Prep HPF POC MODERATE (*) NONE SEEN   Trich, Wet Prep NONE SEEN  NONE SEEN   Clue Cells Wet Prep HPF POC NONE SEEN  NONE SEEN   WBC, Wet Prep HPF POC MANY (*) NONE SEEN    Review of Systems  Constitutional: Negative for fever and chills.  Gastrointestinal: Positive for abdominal pain. Negative for nausea, vomiting, diarrhea and constipation.       + bilateral lower abdominal pain  Genitourinary: Negative for dysuria, urgency, frequency and hematuria.       + vaginal discharge. No vaginal bleeding. No dysuria.   Neurological: Positive for dizziness. Negative for headaches.   Physical Exam   Blood pressure 108/54, pulse 93, temperature 98.8 F (37.1 C), temperature source Oral, resp. rate 16, height 5\' 1"  (1.549 m), weight 58.605 kg (129 lb 3.2 oz), last menstrual period 06/19/2013, SpO2 99.00%. Fetal heart tones by doppler 162 bpm    Physical Exam  Constitutional: She  appears well-developed and well-nourished. No distress.  HENT:  Head: Normocephalic.  Neck: Neck supple.  Respiratory: Effort normal.  GI: Soft. She exhibits no distension. There is tenderness. There is no rebound and no guarding.  +Suprapubic tenderness   Genitourinary:  Speculum exam: Vagina - Small amount of creamy, thick, curd-like discharge,mild odor Cervix - No contact bleeding Bimanual exam: Cervix closed, mild CMT  Uterus non tender, normal size; gravid  Adnexa non tender, no masses bilaterally Wet prep done Chaperone present for exam.   Skin: She is not diaphoretic.    MAU Course  Procedures None  MDM UA Wet prep GC/Chlamydia  + fht  Diflucan 150 mg PO in MAU   Assessment and Plan  A: Yeast Vaginitis   P: Discharge home RX: Diflucan; take one pill in 3 days, take another pill in 6 days. Keep your prenatal appointment with the clinic as scheduled Return to MAU as needed, if symptoms worsen.   Talayah Picardi IRENE  FNP-C   10/26/2013, 4:01 PM

## 2013-10-28 NOTE — MAU Provider Note (Signed)
Attestation of Attending Supervision of Advanced Practitioner (CNM/NP): Evaluation and management procedures were performed by the Advanced Practitioner under my supervision and collaboration. I have reviewed the Advanced Practitioner's note and chart, and I agree with the management and plan.  LEGGETT,KELLY H. 9:50 AM

## 2013-11-03 ENCOUNTER — Encounter: Payer: BC Managed Care – PPO | Admitting: Family Medicine

## 2013-11-18 ENCOUNTER — Ambulatory Visit (INDEPENDENT_AMBULATORY_CARE_PROVIDER_SITE_OTHER): Payer: BC Managed Care – PPO | Admitting: Advanced Practice Midwife

## 2013-11-18 ENCOUNTER — Encounter: Payer: Self-pay | Admitting: Advanced Practice Midwife

## 2013-11-18 VITALS — BP 105/65 | Temp 99.1°F | Wt 131.7 lb

## 2013-11-18 DIAGNOSIS — O0930 Supervision of pregnancy with insufficient antenatal care, unspecified trimester: Secondary | ICD-10-CM

## 2013-11-18 DIAGNOSIS — N390 Urinary tract infection, site not specified: Secondary | ICD-10-CM

## 2013-11-18 DIAGNOSIS — O0932 Supervision of pregnancy with insufficient antenatal care, second trimester: Secondary | ICD-10-CM

## 2013-11-18 DIAGNOSIS — O2390 Unspecified genitourinary tract infection in pregnancy, unspecified trimester: Secondary | ICD-10-CM

## 2013-11-18 LAB — POCT URINALYSIS DIP (DEVICE)
Bilirubin Urine: NEGATIVE
Glucose, UA: NEGATIVE mg/dL
Ketones, ur: NEGATIVE mg/dL
Nitrite: NEGATIVE
Specific Gravity, Urine: 1.025 (ref 1.005–1.030)

## 2013-11-18 MED ORDER — CEPHALEXIN 500 MG PO CAPS: 500.0000 mg | ORAL_CAPSULE | Freq: Four times a day (QID) | ORAL | Status: AC

## 2013-11-18 NOTE — Patient Instructions (Signed)
Second Trimester of Pregnancy The second trimester is from week 13 through week 28, months 4 through 6. The second trimester is often a time when you feel your best. Your body has also adjusted to being pregnant, and you begin to feel better physically. Usually, morning sickness has lessened or quit completely, you may have more energy, and you may have an increase in appetite. The second trimester is also a time when the fetus is growing rapidly. At the end of the sixth month, the fetus is about 9 inches long and weighs about 1 pounds. You will likely begin to feel the baby move (quickening) between 18 and 20 weeks of the pregnancy. BODY CHANGES Your body goes through many changes during pregnancy. The changes vary from woman to woman.   Your weight will continue to increase. You will notice your lower abdomen bulging out.  You may begin to get stretch marks on your hips, abdomen, and breasts.  You may develop headaches that can be relieved by medicines approved by your caregiver.  You may urinate more often because the fetus is pressing on your bladder.  You may develop or continue to have heartburn as a result of your pregnancy.  You may develop constipation because certain hormones are causing the muscles that push waste through your intestines to slow down.  You may develop hemorrhoids or swollen, bulging veins (varicose veins).  You may have back pain because of the weight gain and pregnancy hormones relaxing your joints between the bones in your pelvis and as a result of a shift in weight and the muscles that support your balance.  Your breasts will continue to grow and be tender.  Your gums may bleed and may be sensitive to brushing and flossing.  Dark spots or blotches (chloasma, mask of pregnancy) may develop on your face. This will likely fade after the baby is born.  A dark line from your belly button to the pubic area (linea nigra) may appear. This will likely fade after the  baby is born. WHAT TO EXPECT AT YOUR PRENATAL VISITS During a routine prenatal visit:  You will be weighed to make sure you and the fetus are growing normally.  Your blood pressure will be taken.  Your abdomen will be measured to track your baby's growth.  The fetal heartbeat will be listened to.  Any test results from the previous visit will be discussed. Your caregiver may ask you:  How you are feeling.  If you are feeling the baby move.  If you have had any abnormal symptoms, such as leaking fluid, bleeding, severe headaches, or abdominal cramping.  If you have any questions. Other tests that may be performed during your second trimester include:  Blood tests that check for:  Low iron levels (anemia).  Gestational diabetes (between 24 and 28 weeks).  Rh antibodies.  Urine tests to check for infections, diabetes, or protein in the urine.  An ultrasound to confirm the proper growth and development of the baby.  An amniocentesis to check for possible genetic problems.  Fetal screens for spina bifida and Down syndrome. HOME CARE INSTRUCTIONS   Avoid all smoking, herbs, alcohol, and unprescribed drugs. These chemicals affect the formation and growth of the baby.  Follow your caregiver's instructions regarding medicine use. There are medicines that are either safe or unsafe to take during pregnancy.  Exercise only as directed by your caregiver. Experiencing uterine cramps is a good sign to stop exercising.  Continue to eat regular,   healthy meals.  Wear a good support bra for breast tenderness.  Do not use hot tubs, steam rooms, or saunas.  Wear your seat belt at all times when driving.  Avoid raw meat, uncooked cheese, cat litter boxes, and soil used by cats. These carry germs that can cause birth defects in the baby.  Take your prenatal vitamins.  Try taking a stool softener (if your caregiver approves) if you develop constipation. Eat more high-fiber foods,  such as fresh vegetables or fruit and whole grains. Drink plenty of fluids to keep your urine clear or pale yellow.  Take warm sitz baths to soothe any pain or discomfort caused by hemorrhoids. Use hemorrhoid cream if your caregiver approves.  If you develop varicose veins, wear support hose. Elevate your feet for 15 minutes, 3 4 times a day. Limit salt in your diet.  Avoid heavy lifting, wear low heel shoes, and practice good posture.  Rest with your legs elevated if you have leg cramps or low back pain.  Visit your dentist if you have not gone yet during your pregnancy. Use a soft toothbrush to brush your teeth and be gentle when you floss.  A sexual relationship may be continued unless your caregiver directs you otherwise.  Continue to go to all your prenatal visits as directed by your caregiver. SEEK MEDICAL CARE IF:   You have dizziness.  You have mild pelvic cramps, pelvic pressure, or nagging pain in the abdominal area.  You have persistent nausea, vomiting, or diarrhea.  You have a bad smelling vaginal discharge.  You have pain with urination. SEEK IMMEDIATE MEDICAL CARE IF:   You have a fever.  You are leaking fluid from your vagina.  You have spotting or bleeding from your vagina.  You have severe abdominal cramping or pain.  You have rapid weight gain or loss.  You have shortness of breath with chest pain.  You notice sudden or extreme swelling of your face, hands, ankles, feet, or legs.  You have not felt your baby move in over an hour.  You have severe headaches that do not go away with medicine.  You have vision changes. Document Released: 12/11/2001 Document Revised: 08/19/2013 Document Reviewed: 02/17/2013 [employer] Patient Information 2014 [employer].  

## 2013-11-18 NOTE — Progress Notes (Signed)
New OB. See other note.  Subjective:    Krista Vaughn is a [id number] [redacted]w[redacted]d being seen today for her first obstetrical visit.  Her obstetrical history is significant for Late to care, . Patient does intend to breast feed. Pregnancy history fully reviewed.  Patient reports malaise, intermittent dizziness, urinary pressure.  Filed Vitals:   11/18/13 0923  BP: 105/65  Temp: 99.1 F (37.3 C)  Weight: 59.739 kg (131 lb 11.2 oz)    HISTORY: OB History  Gravida Para Term Preterm AB SAB TAB Ectopic Multiple Living  5 2 2  2  0 2   2    # Outcome Date GA Lbr Len/2nd Weight Sex Delivery Anes PTL Lv  5 CUR           4 TAB 08/2012          3 TAB 2013          2 TRM 2007 [redacted]w[redacted]d   M SVD   Y  1 TRM 2006 [redacted]w[redacted]d   M SVD   Y     Comments: No complications      Past Medical History  Diagnosis Date  . Chlamydia   . Bronchitis   . Depression   . Anxiety   . Migraine    Past Surgical History  Procedure Laterality Date  . Induced abortion    . Therapeutic abortion     Family History  Problem Relation Age of Onset  . Down syndrome Sister      Exam    Uterus:  Fundal Height: 18 cm  Pelvic Exam:    Perineum: Normal Perineum   Vulva: Bartholin's, Urethra, Skene's normal   Vagina:  normal mucosa, normal discharge   pH:    Cervix: multiparous appearance   Adnexa: no mass, fullness, tenderness   Bony Pelvis: gynecoid  System: Breast:  normal appearance, no masses or tenderness   Skin: normal coloration and turgor, no rashes    Neurologic: oriented, grossly non-focal   Extremities: normal strength, tone, and muscle mass   HEENT neck supple with midline trachea   Mouth/Teeth mucous membranes moist, pharynx normal without lesions   Neck supple and no masses   Cardiovascular: regular rate and rhythm   Respiratory:  appears well, vitals normal, no respiratory distress, acyanotic, normal RR, ear and throat exam is normal, neck free of mass or lymphadenopathy, chest clear, no  wheezing, crepitations, rhonchi, normal symmetric air entry   Abdomen: soft, non-tender; bowel sounds normal; no masses,  no organomegaly   Urinary: urethral meatus normal      Assessment:    Pregnancy: [id number] Patient Active Problem List   Diagnosis Date Noted  . Late prenatal care complicating pregnancy in second trimester 11/18/2013        Plan:     Initial labs drawn. Prenatal vitamins. Problem list reviewed and updated. Genetic Screening discussed Quad Screen: declined.  States has sister with Krista Vaughn and diagnosis would not make any difference in her pregnancy decisions.   Ultrasound discussed; fetal survey: ordered.  Follow up in 4 weeks. 50% of 30 min visit spent on counseling and coordination of care.      [facility] 11/18/2013

## 2013-11-18 NOTE — Progress Notes (Signed)
P= 97 C/o of intermittent lower abdominal/pelvic pressure and sharp pains. 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 States she is over worked and stressed but feels like something is "not right."  Pt. Requests refill fioricet.  Pt. Would like to talk about genetic screening as sister has down syndrome.  New OB packet given.

## 2013-11-19 LAB — OBSTETRIC PANEL
Eosinophils Absolute: 0.1 10*3/uL (ref 0.0–0.7)
Hemoglobin: 10.7 g/dL — ABNORMAL LOW (ref 12.0–15.0)
Hepatitis B Surface Ag: NEGATIVE
Lymphs Abs: 2 10*3/uL (ref 0.7–4.0)
MCH: 28.5 pg (ref 26.0–34.0)
MCV: 84.6 fL (ref 78.0–100.0)
Monocytes Relative: 4 % (ref 3–12)
Neutrophils Relative %: 80 % — ABNORMAL HIGH (ref 43–77)
RBC: 3.76 MIL/uL — ABNORMAL LOW (ref 3.87–5.11)
Rh Type: POSITIVE

## 2013-11-20 ENCOUNTER — Inpatient Hospital Stay (HOSPITAL_COMMUNITY): Payer: BC Managed Care – PPO

## 2013-11-20 ENCOUNTER — Inpatient Hospital Stay (HOSPITAL_COMMUNITY)
Admission: AD | Admit: 2013-11-20 | Discharge: 2013-11-20 | Disposition: A | Discharge status: Home / Self Care | Payer: BC Managed Care – PPO | Source: Ambulatory Visit | Attending: Obstetrics & Gynecology | Admitting: Obstetrics & Gynecology

## 2013-11-20 ENCOUNTER — Encounter (HOSPITAL_COMMUNITY): Payer: Self-pay | Admitting: General Practice

## 2013-11-20 DIAGNOSIS — O2390 Unspecified genitourinary tract infection in pregnancy, unspecified trimester: Secondary | ICD-10-CM

## 2013-11-20 DIAGNOSIS — M549 Dorsalgia, unspecified: Secondary | ICD-10-CM | POA: Insufficient documentation

## 2013-11-20 DIAGNOSIS — K219 Gastro-esophageal reflux disease without esophagitis: Secondary | ICD-10-CM | POA: Insufficient documentation

## 2013-11-20 DIAGNOSIS — IMO0002 Reserved for concepts with insufficient information to code with codable children: Secondary | ICD-10-CM | POA: Insufficient documentation

## 2013-11-20 DIAGNOSIS — O26892 Other specified pregnancy related conditions, second trimester: Secondary | ICD-10-CM

## 2013-11-20 DIAGNOSIS — N390 Urinary tract infection, site not specified: Secondary | ICD-10-CM | POA: Insufficient documentation

## 2013-11-20 DIAGNOSIS — O2342 Unspecified infection of urinary tract in pregnancy, second trimester: Secondary | ICD-10-CM

## 2013-11-20 DIAGNOSIS — R51 Headache: Secondary | ICD-10-CM | POA: Insufficient documentation

## 2013-11-20 LAB — CBC
Hemoglobin: 10.4 g/dL — ABNORMAL LOW (ref 12.0–15.0)
MCH: 28.3 pg (ref 26.0–34.0)
MCHC: 33.5 g/dL (ref 30.0–36.0)
MCV: 84.5 fL (ref 78.0–100.0)
Platelets: 214 10*3/uL (ref 150–400)
RDW: 15 % (ref 11.5–15.5)

## 2013-11-20 LAB — URINE MICROSCOPIC-ADD ON

## 2013-11-20 LAB — HEMOGLOBINOPATHY EVALUATION
Hemoglobin Other: 0 %
Hgb F Quant: 0 % (ref 0.0–2.0)

## 2013-11-20 LAB — URINALYSIS, ROUTINE W REFLEX MICROSCOPIC
Ketones, ur: NEGATIVE mg/dL
Nitrite: NEGATIVE
Specific Gravity, Urine: 1.01 (ref 1.005–1.030)
Urobilinogen, UA: 0.2 mg/dL (ref 0.0–1.0)
pH: 7.5 (ref 5.0–8.0)

## 2013-11-20 LAB — PRESCRIPTION MONITORING PROFILE (19 PANEL)
Barbiturate Screen, Urine: NEGATIVE ng/mL
Benzodiazepine Screen, Urine: NEGATIVE ng/mL
Buprenorphine, Urine: NEGATIVE ng/mL
Cocaine Metabolites: NEGATIVE ng/mL
Creatinine, Urine: 103.68 mg/dL (ref >20.0)
Fentanyl, Ur: NEGATIVE ng/mL
Methaqualone: NEGATIVE ng/mL
Opiate Screen, Urine: NEGATIVE ng/mL
Oxycodone Screen, Ur: NEGATIVE ng/mL
Phencyclidine, Ur: NEGATIVE ng/mL
Zolpidem, Urine: NEGATIVE ng/mL

## 2013-11-20 LAB — COMPREHENSIVE METABOLIC PANEL
ALT: 8 U/L (ref 0–35)
AST: 11 U/L (ref 0–37)
Albumin: 3.1 g/dL — ABNORMAL LOW (ref 3.5–5.2)
Alkaline Phosphatase: 58 U/L (ref 39–117)
Calcium: 8.7 mg/dL (ref 8.4–10.5)
Creatinine, Ser: 0.45 mg/dL — ABNORMAL LOW (ref 0.50–1.10)
GFR calc Af Amer: >90 mL/min (ref >90)
Glucose, Bld: 82 mg/dL (ref 70–99)
Potassium: 3.8 mEq/L (ref 3.5–5.1)
Sodium: 137 mEq/L (ref 135–145)
Total Protein: 6.4 g/dL (ref 6.0–8.3)

## 2013-11-20 LAB — CANNABANOIDS (GC/LC/MS), URINE: THC-COOH (GC/LC/MS), ur confirm: 328 ng/mL — AB

## 2013-11-20 MED ORDER — CYCLOBENZAPRINE HCL 10 MG PO TABS
10.0000 mg | ORAL_TABLET | ORAL | Status: AC
  Administered 2013-11-20: 10 mg via ORAL
  Filled 2013-11-20: qty 1

## 2013-11-20 MED ORDER — DIPHENHYDRAMINE HCL 50 MG/ML IJ SOLN
25.0000 mg | INTRAMUSCULAR | Status: AC
  Administered 2013-11-20: 16:00:00 via INTRAVENOUS
  Filled 2013-11-20: qty 1

## 2013-11-20 MED ORDER — SODIUM CHLORIDE 0.9 % IV BOLUS (SEPSIS)
1000.0000 mL | INTRAVENOUS | Status: AC
  Administered 2013-11-20: 1000 mL via INTRAVENOUS

## 2013-11-20 MED ORDER — FAMOTIDINE IN NACL 20-0.9 MG/50ML-% IV SOLN
20.0000 mg | INTRAVENOUS | Status: AC
  Administered 2013-11-20: 20 mg via INTRAVENOUS
  Filled 2013-11-20: qty 50

## 2013-11-20 MED ORDER — CYCLOBENZAPRINE HCL 10 MG PO TABS: 5.0000 mg | ORAL_TABLET | Freq: Three times a day (TID) | ORAL | Status: DC | PRN

## 2013-11-20 MED ORDER — METOCLOPRAMIDE HCL 5 MG/ML IJ SOLN
10.0000 mg | INTRAMUSCULAR | Status: AC
  Administered 2013-11-20: 10 mg via INTRAVENOUS
  Filled 2013-11-20: qty 2

## 2013-11-20 MED ORDER — DEXAMETHASONE SODIUM PHOSPHATE 10 MG/ML IJ SOLN
10.0000 mg | INTRAMUSCULAR | Status: AC
  Administered 2013-11-20: 10 mg via INTRAVENOUS
  Filled 2013-11-20: qty 1

## 2013-11-20 MED ORDER — DIPHENHYDRAMINE HCL 50 MG/ML IJ SOLN
25.0000 mg | INTRAMUSCULAR | Status: AC
  Administered 2013-11-20: 25 mg via INTRAVENOUS
  Filled 2013-11-20: qty 1

## 2013-11-20 MED ORDER — RANITIDINE HCL 150 MG PO TABS: 150.0000 mg | ORAL_TABLET | Freq: Two times a day (BID) | ORAL | Status: DC

## 2013-11-20 NOTE — MAU Provider Note (Signed)
Chief Complaint: Headache, Back Pain and Fatigue   First Provider Initiated Contact with Patient 11/20/13 1515     SUBJECTIVE HPI: Krista Vaughn is a 27 y.o. [id number] at [redacted]w[redacted]d by LMP who presents to maternity admissions reporting low back pain described as sudden onset on left side radiating to left flank x1 day.  Pt also reports frequent migraines, which she has today, and intermittent heartburn. Pt describes her headaches as tightening of her forehead, feeling like she cannot relax, and pain radiating into her neck.  Pt She denies LOF, vaginal bleeding, vaginal itching/burning, urinary symptoms, dizziness, n/v, or fever/chills.   Regarding her back pain, she does stand for 18 hours a day (school & work) but denies any recent trauma, increase in her typical activity, saddle anesthesia, bowel/bladder incontinence, or change in gait. The pain does get better when she lays down on her side.   Past Medical History  Diagnosis Date  . Chlamydia   . Bronchitis   . Depression   . Anxiety   . Migraine    Past Surgical History  Procedure Laterality Date  . Induced abortion    . Therapeutic abortion     History   Social History  . Marital Status: Single    Spouse Name: N/A    Number of Children: N/A  . Years of Education: N/A   Occupational History  . Not on file.   Social History Main Topics  . Smoking status: Current Some Day Smoker    Types: Cigarettes  . Smokeless tobacco: Never Used  . Alcohol Use: No  . Drug Use: No  . Sexual Activity: Yes    Birth Control/ Protection: None   Other Topics Concern  . Not on file   Social History Narrative  . No narrative on file   No current facility-administered medications on file prior to encounter.   Current Outpatient Prescriptions on File Prior to Encounter  Medication Sig Dispense Refill  . butalbital-acetaminophen-caffeine (FIORICET) 50-325-40 MG per tablet Take 1-2 tablets by mouth every 6 (six) hours as needed for  headache.  20 tablet  0  . cephALEXin (KEFLEX) 500 MG capsule Take 1 capsule (500 mg total) by mouth 4 (four) times daily.  28 capsule  0   Allergies  Allergen Reactions  . Decadron [Dexamethasone] Other (See Comments)    Makes her skin crawl    ROS: Pertinent items in HPI  OBJECTIVE Blood pressure 105/55, pulse 96, resp. rate 16, height 5\' 1"  (1.549 m), weight 59.33 kg (130 lb 12.8 oz), last menstrual period 06/19/2013, SpO2 99.00%. GENERAL: Well-developed, well-nourished female in no acute distress.  HEENT: Normocephalic HEART: normal rate RESP: normal effort ABDOMEN: Soft, non-tender BACK: Marked CVA tenderness on left side EXTREMITIES: Nontender, no edema NEURO: Alert and oriented   LAB RESULTS Results for orders placed during the hospital encounter of 11/20/13 (from the past 24 hour(s))  URINALYSIS, ROUTINE W REFLEX MICROSCOPIC     Status: Abnormal   Collection Time    11/20/13 11:45 AM      Result Value Range   Color, Urine YELLOW  YELLOW   APPearance CLEAR  CLEAR   Specific Gravity, Urine 1.010  1.005 - 1.030   pH 7.5  5.0 - 8.0   Glucose, UA NEGATIVE  NEGATIVE mg/dL   Hgb urine dipstick MODERATE (*) NEGATIVE   Bilirubin Urine NEGATIVE  NEGATIVE   Ketones, ur NEGATIVE  NEGATIVE mg/dL   Protein, ur NEGATIVE  NEGATIVE mg/dL   Urobilinogen, UA  0.2  0.0 - 1.0 mg/dL   Nitrite NEGATIVE  NEGATIVE   Leukocytes, UA LARGE (*) NEGATIVE  URINE MICROSCOPIC-ADD ON     Status: Abnormal   Collection Time    11/20/13 11:45 AM      Result Value Range   Squamous Epithelial / LPF RARE  RARE   WBC, UA TOO NUMEROUS TO COUNT  <3 WBC/hpf   RBC / HPF 3-6  <3 RBC/hpf   Bacteria, UA FEW (*) RARE  CBC     Status: Abnormal   Collection Time    11/20/13  3:55 PM      Result Value Range   WBC 10.8 (*) 4.0 - 10.5 K/uL   RBC 3.67 (*) 3.87 - 5.11 MIL/uL   Hemoglobin 10.4 (*) 12.0 - 15.0 g/dL   HCT 16.1 (*) 09.6 - 04.5 %   MCV 84.5  78.0 - 100.0 fL   MCH 28.3  26.0 - 34.0 pg   MCHC  33.5  30.0 - 36.0 g/dL   RDW 40.9  81.1 - 91.4 %   Platelets 214  150 - 400 K/uL  COMPREHENSIVE METABOLIC PANEL     Status: Abnormal   Collection Time    11/20/13  3:55 PM      Result Value Range   Sodium 137  135 - 145 mEq/L   Potassium 3.8  3.5 - 5.1 mEq/L   Chloride 104  96 - 112 mEq/L   CO2 23  19 - 32 mEq/L   Glucose, Bld 82  70 - 99 mg/dL   BUN 5 (*) 6 - 23 mg/dL   Creatinine, Ser 7.82 (*) 0.50 - 1.10 mg/dL   Calcium 8.7  8.4 - 95.6 mg/dL   Total Protein 6.4  6.0 - 8.3 g/dL   Albumin 3.1 (*) 3.5 - 5.2 g/dL   AST 11  0 - 37 U/L   ALT 8  0 - 35 U/L   Alkaline Phosphatase 58  39 - 117 U/L   Total Bilirubin 0.1 (*) 0.3 - 1.2 mg/dL   GFR calc non Af Amer >90  >90 mL/min   GFR calc Af Amer >90  >90 mL/min    IMAGING US Renal  11/20/2013   CLINICAL DATA:  Back pain, pregnant  EXAM: RENAL/URINARY TRACT ULTRASOUND COMPLETE  COMPARISON:  None.  FINDINGS: Right Kidney  Length: 12.1 cm. Echogenicity normal. Minimal collecting system fullness. No stones.  Left Kidney  Length: 11.9 cm. Echogenicity normal. Minimal collecting system fullness.No stones.  Bladder  Normal.  Bilateral ureteral jets identified.  IMPRESSION: Mild collecting system fullness bilaterally consistent with pregnancy. No obstruction.   Electronically Signed   By: Esperanza Heir M.D.   On: 11/20/2013 18:50    ASSESSMENT 1. UTI in pregnancy, antepartum, second trimester   2. Acid reflux   3. Headache in pregnancy, antepartum, second trimester   Likely tension headaches  PLAN Renal Ultrasound CBC, CMP, Headache protocol (NS x1000 ml, Reglan 10 mg IV, Benadryl 25 mg IV, Decadron 10 mg IV) Pt had reaction to Decadron, felt like she was shaky, like a panic attack--given extra Benadryl 25 mg IV--resolved D/C home Continue Keflex QID Today's urine sent for culture (no growth on 11/18/13) Zantac 150 mg BID Flexeril 10 mg TID PRN back pain/headache F/U in clinic as scheduled Return to MAU as needed    Medication  List         butalbital-acetaminophen-caffeine 50-325-40 MG per tablet  Commonly known as:  FIORICET  Take  1-2 tablets by mouth every 6 (six) hours as needed for headache.     cephALEXin 500 MG capsule  Commonly known as:  KEFLEX  Take 1 capsule (500 mg total) by mouth 4 (four) times daily.     [employer] PRENATAL GUMMY PO  Take 1 each by mouth daily.     cyclobenzaprine 10 MG tablet  Commonly known as:  FLEXERIL  Take 0.5-1 tablets (5-10 mg total) by mouth 3 (three) times daily as needed for muscle spasms.     ranitidine 150 MG tablet  Commonly known as:  ZANTAC  Take 1 tablet (150 mg total) by mouth 2 (two) times daily.       Follow-up Information   Follow up with [facility]. (Keep scheduled appointments.  Return to MAU as needed.)    Specialty:  Obstetrics and Gynecology   Contact information:   [address] [place] [address] [phone]      Sharen Counter Certified Nurse-Midwife 11/20/2013  8:05 PM

## 2013-11-20 NOTE — MAU Provider Note (Signed)
Attestation of Attending Supervision of Advanced Practitioner (CNM/NP): Evaluation and management procedures were performed by the Advanced Practitioner under my supervision and collaboration.  I have reviewed the Advanced Practitioner's note and chart, and I agree with the management and plan.  HARRAWAY-SMITH, Demtrius Rounds 9:58 PM     

## 2013-11-20 NOTE — MAU Note (Signed)
Patient states she has a lot of headaches but is worse today. States she has back pain off and on. States she feels tired due to the headache. Denies bleeding, leaking or discharge.

## 2013-11-20 NOTE — MAU Note (Signed)
Patient is currently taking medication for UTI

## 2013-11-20 NOTE — MAU Note (Signed)
Keeps having cramping in lower back, (started this morning) and continuous migraines.  Tylenol is not working.

## 2013-11-22 ENCOUNTER — Encounter: Payer: Self-pay | Admitting: Advanced Practice Midwife

## 2013-11-22 DIAGNOSIS — F1290 Cannabis use, unspecified, uncomplicated: Secondary | ICD-10-CM | POA: Insufficient documentation

## 2013-11-22 LAB — URINE CULTURE: Colony Count: >=100000

## 2013-11-29 ENCOUNTER — Encounter: Payer: Self-pay | Admitting: Advanced Practice Midwife

## 2013-11-29 DIAGNOSIS — IMO0002 Reserved for concepts with insufficient information to code with codable children: Secondary | ICD-10-CM | POA: Insufficient documentation

## 2013-11-29 DIAGNOSIS — R87612 Low grade squamous intraepithelial lesion on cytologic smear of cervix (LGSIL): Secondary | ICD-10-CM | POA: Insufficient documentation

## 2013-12-02 ENCOUNTER — Other Ambulatory Visit: Payer: Self-pay | Admitting: Advanced Practice Midwife

## 2013-12-02 ENCOUNTER — Ambulatory Visit (HOSPITAL_COMMUNITY)
Admission: RE | Admit: 2013-12-02 | Discharge: 2013-12-02 | Disposition: A | Discharge status: Home / Self Care | Payer: BC Managed Care – PPO | Source: Ambulatory Visit | Attending: Advanced Practice Midwife | Admitting: Advanced Practice Midwife

## 2013-12-02 DIAGNOSIS — O0932 Supervision of pregnancy with insufficient antenatal care, second trimester: Secondary | ICD-10-CM

## 2013-12-02 DIAGNOSIS — Z3689 Encounter for other specified antenatal screening: Secondary | ICD-10-CM | POA: Insufficient documentation

## 2013-12-07 ENCOUNTER — Encounter: Payer: Self-pay | Admitting: Advanced Practice Midwife

## 2013-12-07 DIAGNOSIS — O358XX0 Maternal care for other (suspected) fetal abnormality and damage, not applicable or unspecified: Secondary | ICD-10-CM | POA: Insufficient documentation

## 2013-12-10 ENCOUNTER — Inpatient Hospital Stay (HOSPITAL_COMMUNITY)
Admission: AD | Admit: 2013-12-10 | Discharge: 2013-12-10 | Disposition: A | Discharge status: Home / Self Care | Payer: BC Managed Care – PPO | Source: Ambulatory Visit | Attending: Obstetrics & Gynecology | Admitting: Obstetrics & Gynecology

## 2013-12-10 ENCOUNTER — Encounter (HOSPITAL_COMMUNITY): Payer: Self-pay | Admitting: *Deleted

## 2013-12-10 DIAGNOSIS — R51 Headache: Secondary | ICD-10-CM | POA: Insufficient documentation

## 2013-12-10 DIAGNOSIS — O99340 Other mental disorders complicating pregnancy, unspecified trimester: Secondary | ICD-10-CM | POA: Insufficient documentation

## 2013-12-10 DIAGNOSIS — F3289 Other specified depressive episodes: Secondary | ICD-10-CM | POA: Insufficient documentation

## 2013-12-10 DIAGNOSIS — F411 Generalized anxiety disorder: Secondary | ICD-10-CM

## 2013-12-10 DIAGNOSIS — F419 Anxiety disorder, unspecified: Secondary | ICD-10-CM

## 2013-12-10 DIAGNOSIS — F329 Major depressive disorder, single episode, unspecified: Secondary | ICD-10-CM | POA: Insufficient documentation

## 2013-12-10 LAB — URINALYSIS, ROUTINE W REFLEX MICROSCOPIC
Bilirubin Urine: NEGATIVE
Ketones, ur: >80 mg/dL — AB
Nitrite: NEGATIVE
Specific Gravity, Urine: 1.025 (ref 1.005–1.030)
pH: 7 (ref 5.0–8.0)

## 2013-12-10 LAB — URINE MICROSCOPIC-ADD ON

## 2013-12-10 MED ORDER — HYDROXYZINE HCL 50 MG PO TABS: 50.0000 mg | ORAL_TABLET | Freq: Three times a day (TID) | ORAL | Status: DC | PRN

## 2013-12-10 MED ORDER — SERTRALINE HCL 50 MG PO TABS: 50.0000 mg | ORAL_TABLET | Freq: Every day | ORAL | Status: DC

## 2013-12-10 MED ORDER — CYCLOBENZAPRINE HCL 10 MG PO TABS: 5.0000 mg | ORAL_TABLET | Freq: Three times a day (TID) | ORAL | Status: DC | PRN

## 2013-12-10 MED ORDER — HYDROXYZINE HCL 50 MG PO TABS
50.0000 mg | ORAL_TABLET | ORAL | Status: AC
  Administered 2013-12-10: 50 mg via ORAL
  Filled 2013-12-10: qty 1

## 2013-12-10 MED ORDER — BUSPIRONE HCL 5 MG PO TABS: 5.0000 mg | ORAL_TABLET | Freq: Three times a day (TID) | ORAL | Status: DC

## 2013-12-10 NOTE — MAU Provider Note (Signed)
Chief Complaint:  Migraine   First Provider Initiated Contact with Patient 12/10/13 1339      HPI: Krista Vaughn is a 27 y.o. [id number] at 54w5dwho presents to maternity admissions reporting severe anxiety accompanied by migraine.  She reports irritability with her children for the last 2-3 days, uncontrollable crying, and hearing voices which do not tell her to do anything bad or hurt anyone, just say that she is no good and no one loves her. She has had headaches during the pregnancy and was prescribed Flexeril which usually helps.  Her headache is better today than yesterday but Flexeril taken last night did not completely resolve it. She denies risks of harming herself or others, but reports that she is worried something bad is going to happen to her and her kids will not have anyone. She denies abdominal cramping, vaginal bleeding, vaginal itching/burning, urinary symptoms, dizziness, n/v, or fever/chills.    After Vistaril dose given in MAU, pt reports she feels the voice she hears is her own voice, just her negative thoughts.  She just cannot get a handle on the negative feelings and they are getting the best of her.  She does not want to stay in MAU until a psych provider is available to evaluate her but does desire outpatient counseling and assistance.  She is planning to go to work in a couple of hours and does not desire note to miss work.  Past Medical History: Past Medical History  Diagnosis Date  . Chlamydia   . Bronchitis   . Depression   . Anxiety   . Migraine     Past obstetric history: OB History  Gravida Para Term Preterm AB SAB TAB Ectopic Multiple Living  5 2 2  2  0 2   2    # Outcome Date GA Lbr Len/2nd Weight Sex Delivery Anes PTL Lv  5 CUR           4 TAB 08/2012          3 TAB 2013          2 TRM 2007 [redacted]w[redacted]d   M SVD   Y  1 TRM 2006 [redacted]w[redacted]d   M SVD   Y     Comments: No complications       Past Surgical History: Past Surgical History  Procedure Laterality  Date  . Induced abortion    . Therapeutic abortion      Family History: Family History  Problem Relation Age of Onset  . Down syndrome Sister     Social History: History  Substance Use Topics  . Smoking status: Former Smoker    Types: Cigarettes  . Smokeless tobacco: Never Used  . Alcohol Use: No    Allergies:  Allergies  Allergen Reactions  . Decadron [Dexamethasone] Other (See Comments)    Makes her skin crawl    Meds:  Prescriptions prior to admission  Medication Sig Dispense Refill  . cyclobenzaprine (FLEXERIL) 10 MG tablet Take 0.5-1 tablets (5-10 mg total) by mouth 3 (three) times daily as needed for muscle spasms.  30 tablet  0  . Prenatal Vit-Min-FA-Fish Oil ([employer]S PRENATAL GUMMY PO) Take 1 each by mouth daily.        ROS: Pertinent findings in history of present illness.  Physical Exam  Blood pressure 107/51, pulse 87, temperature 98.5 F (36.9 C), temperature source Oral, resp. rate 18, height 5\' 1"  (1.549 m), weight 58.968 kg (130 lb), last menstrual period 06/19/2013, SpO2 100.00%.  GENERAL: Well-developed, well-nourished female in moderate distress.  HEENT: normocephalic HEART: normal rate RESP: normal effort ABDOMEN: Soft, non-tender, gravid appropriate for gestational age EXTREMITIES: Nontender, no edema NEURO: alert and oriented    FHT:  154 by doppler   Labs: Results for orders placed during the hospital encounter of 12/10/13 (from the past 24 hour(s))  URINALYSIS, ROUTINE W REFLEX MICROSCOPIC     Status: Abnormal   Collection Time    12/10/13 12:30 PM      Result Value Range   Color, Urine YELLOW  YELLOW   APPearance HAZY (*) CLEAR   Specific Gravity, Urine 1.025  1.005 - 1.030   pH 7.0  5.0 - 8.0   Glucose, UA NEGATIVE  NEGATIVE mg/dL   Hgb urine dipstick NEGATIVE  NEGATIVE   Bilirubin Urine NEGATIVE  NEGATIVE   Ketones, ur >80 (*) NEGATIVE mg/dL   Protein, ur NEGATIVE  NEGATIVE mg/dL   Urobilinogen, UA 0.2  0.0 - 1.0 mg/dL   Nitrite  NEGATIVE  NEGATIVE   Leukocytes, UA MODERATE (*) NEGATIVE  URINE MICROSCOPIC-ADD ON     Status: Abnormal   Collection Time    12/10/13 12:30 PM      Result Value Range   Squamous Epithelial / LPF FEW (*) RARE   WBC, UA 21-50  <3 WBC/hpf   RBC / HPF 7-10  <3 RBC/hpf   Bacteria, UA FEW (*) RARE   Urine-Other MUCOUS PRESENT       Assessment: 1. Anxiety in pregnancy in second trimester, antepartum   2. Depression complicating pregnancy in second trimester, antepartum     Plan: Vistaril 50 mg PO x1 dose in MAU Social work consulted--pt has plan for f/u outpatient therapy at [facility] the [facility] Discussed medications for anxiety/depression, risks/benefits/side-effects.  Pt Ok to try SSRI daily and safe anti-anxiety medications.  Will f/u in clinic and with counseling service on effectiveness and for adjustment in dose as needed.  Pt agrees to return to MAU or call clinic if thoughts of suicide after starting medication.  Buspar 5 mg TID, Vistaril 50 mg TID PRN Zoloft 50 mg PO QD F/U with outpatient therapy as planned F/U as scheduled for OB visits Return to MAU as needed if symptoms persist or worsen     Medication List    ASK your doctor about these medications       CVS PRENATAL GUMMY PO  Take 1 each by mouth daily.     cyclobenzaprine 10 MG tablet  Commonly known as:  FLEXERIL  Take 0.5-1 tablets (5-10 mg total) by mouth 3 (three) times daily as needed for muscle spasms.        Sharen Counter Certified Nurse-Midwife 12/10/2013 1:40 PM

## 2013-12-10 NOTE — MAU Note (Signed)
C/o migraine since last night around 1900; c/o de[ression for the past week;

## 2013-12-10 NOTE — Discharge Instructions (Signed)
Generalized Anxiety Disorder Generalized anxiety disorder (GAD) is a mental disorder. It interferes with life functions, including relationships, work, and school. GAD is different from normal anxiety, which everyone experiences at some point in their lives in response to specific life events and activities. Normal anxiety actually helps [place] prepare for and get through these life events and activities. Normal anxiety goes away after the event or activity is over.  GAD causes anxiety that is not necessarily related to specific events or activities. It also causes excess anxiety in proportion to specific events or activities. The anxiety associated with GAD is also difficult to control. GAD can vary from mild to severe. People with severe GAD can have intense waves of anxiety with physical symptoms (panic attacks).  SYMPTOMS The anxiety and worry associated with GAD are difficult to control. This anxiety and worry are related to many life events and activities and also occur more days than not for 6 months or longer. People with GAD also have three or more of the following symptoms (one or more in children):  Restlessness.   Fatigue.  Difficulty concentrating.   Irritability.  Muscle tension.  Difficulty sleeping or unsatisfying sleep. DIAGNOSIS GAD is diagnosed through an assessment by your caregiver. Your caregiver will ask you questions aboutyour mood,physical symptoms, and events in your life. Your caregiver may ask you about your medical history and use of alcohol or drugs, including prescription medications. Your caregiver may also do a physical exam and blood tests. Certain medical conditions and the use of certain substances can cause symptoms similar to those associated with GAD. Your caregiver may refer you to a mental health specialist for further evaluation. TREATMENT The following therapies are usually used to treat GAD:   Medication Antidepressant medication usually is  prescribed for long-term daily control. Antianxiety medications may be added in severe cases, especially when panic attacks occur.   Talk therapy (psychotherapy) Certain types of talk therapy can be helpful in treating GAD by providing support, education, and guidance. A form of talk therapy called cognitive behavioral therapy can teach you healthy ways to think about and react to daily life events and activities.  Stress managementtechniques These include yoga, meditation, and exercise and can be very helpful when they are practiced regularly. A mental health specialist can help determine which treatment is best for you. Some people see improvement with one therapy. However, other people require a combination of therapies. Document Released: 04/13/2013 Document Reviewed: 04/13/2013 [facility] Patient Information 2014 [place], [place].  Sertraline tablets What is this medicine? SERTRALINE (SER tra leen) is used to treat depression. It may also be used to treat obsessive compulsive disorder, panic disorder, post-trauma stress, premenstrual dysphoric disorder (PMDD) or social anxiety. This medicine may be used for other purposes; ask your health care provider or pharmacist if you have questions. COMMON BRAND NAME(S): Zoloft What should I tell my health care provider before I take this medicine? They need to know if you have any of these conditions: -bipolar disorder or a family history of bipolar disorder -diabetes -glaucoma -heart disease -high blood pressure -history of irregular heartbeat -history of low levels of calcium, magnesium, or potassium in the blood -if you often drink alcohol -liver disease -receiving electroconvulsive therapy -seizures -suicidal thoughts, plans, or attempt; a previous suicide attempt by you or a family member -thyroid disease -an unusual or allergic reaction to sertraline, other medicines, foods, dyes, or preservatives -pregnant or trying to get  pregnant -breast-feeding How should I use this medicine? Take  this medicine by mouth with a glass of water. Follow the directions on the prescription label. You can take it with or without food. Take your medicine at regular intervals. Do not take your medicine more often than directed. Do not stop taking this medicine suddenly except upon the advice of your doctor. Stopping this medicine too quickly may cause serious side effects or your condition may worsen. A special MedGuide will be given to you by the pharmacist with each prescription and refill. Be sure to read this information carefully each time. Talk to your pediatrician regarding the use of this medicine in children. While this drug may be prescribed for children as young as 7 years for selected conditions, precautions do apply. Overdosage: If you think you have taken too much of this medicine contact a poison control center or emergency room at once. NOTE: This medicine is only for you. Do not share this medicine with others. What if I miss a dose? If you miss a dose, take it as soon as you can. If it is almost time for your next dose, take only that dose. Do not take double or extra doses. What may interact with this medicine? Do not take this medicine with any of the following medications: -linezolid -MAOIs like Carbex, Eldepryl, Marplan, Nardil, and Parnate -methylene blue (injected into a vein) -pimozide -thioridazine This medicine may also interact with the following medications: -alcohol -aspirin and aspirin-like medicines -certain medicines for depression, anxiety, or psychotic disturbances -certain medicines for migraine headaches like almotriptan, eletriptan, frovatriptan, naratriptan, rizatriptan, sumatriptan, zolmitriptan -certain medicines for seizures like carbamazepine, valproic acid, phenytoin -cimetidine -digoxin -diuretics -fentanyl -furazolidone -isoniazid -lithium -medicines that treat or prevent blood  clots like warfarin, enoxaparin, and dalteparin -medicines to control heart rhythm like flecainide or propafenone -medicines for sleep -NSAIDs, medicines for pain and inflammation, like ibuprofen or naproxen -procarbazine -rasagiline -supplements like St. John's wort, kava kava, valerian -tolbutamide -tramadol -tryptophan This list may not describe all possible interactions. Give your health care provider a list of all the medicines, herbs, non-prescription drugs, or dietary supplements you use. Also tell them if you smoke, drink alcohol, or use illegal drugs. Some items may interact with your medicine. What should I watch for while using this medicine? Tell your doctor if your symptoms do not get better or if they get worse. Visit your doctor or health care professional for regular checks on your progress. Because it may take several weeks to see the full effects of this medicine, it is important to continue your treatment as prescribed by your doctor. Patients and their families should watch out for new or worsening thoughts of suicide or depression. Also watch out for sudden changes in feelings such as feeling anxious, agitated, panicky, irritable, hostile, aggressive, impulsive, severely restless, overly excited and hyperactive, or not being able to sleep. If this happens, especially at the beginning of treatment or after a change in dose, call your health care professional. Bonita Quin may get drowsy or dizzy. Do not drive, use machinery, or do anything that needs mental alertness until you know how this medicine affects you. Do not stand or sit up quickly, especially if you are an older patient. This reduces the risk of dizzy or fainting spells. Alcohol may interfere with the effect of this medicine. Avoid alcoholic drinks. Your mouth may get dry. Chewing sugarless gum or sucking hard candy, and drinking plenty of water may help. Contact your doctor if the problem does not go away or is severe. What  side effects may I notice from receiving this medicine? Side effects that you should report to your doctor or health care professional as soon as possible: -allergic reactions like skin rash, itching or hives, swelling of the face, lips, or tongue -black or bloody stools, blood in the urine or vomit -fast, irregular heartbeat -feeling faint or lightheaded, falls -hallucination, loss of contact with reality -seizures -suicidal thoughts or other mood changes -unusual bleeding or bruising -unusually weak or tired -vomiting Side effects that usually do not require medical attention (report to your doctor or health care professional if they continue or are bothersome): -change in appetite -change in sex drive or performance -diarrhea -increased sweating -indigestion, nausea -tremors This list may not describe all possible side effects. Call your doctor for medical advice about side effects. You may report side effects to FDA at 1-800-FDA-1088. Where should I keep my medicine? Keep out of the reach of children. Store at room temperature between 15 and 30 degrees C (59 and 86 degrees F). Throw away any unused medicine after the expiration date. NOTE: This sheet is a summary. It may not cover all possible information. If you have questions about this medicine, talk to your doctor, pharmacist, or health care provider.  2014, Elsevier/Gold Standard. (2013-07-10 17:38:48)  Buspirone tablets What is this medicine? BUSPIRONE (byoo SPYE rone) is used to treat anxiety disorders. This medicine may be used for other purposes; ask your health care provider or pharmacist if you have questions. COMMON BRAND NAME(S): BuSpar What should I tell my health care provider before I take this medicine? They need to know if you have any of these conditions: -kidney or liver disease -an unusual or allergic reaction to buspirone, other medicines, foods, dyes, or preservatives -pregnant or trying to get  pregnant -breast-feeding How should I use this medicine? Take this medicine by mouth with a glass of water. Follow the directions on the prescription label. You may take this medicine with or without food. To ensure that this medicine always works the same way for you, you should take it either always with or always without food. Take your doses at regular intervals. Do not take your medicine more often than directed. Do not stop taking except on the advice of your doctor or health care professional. Talk to your pediatrician regarding the use of this medicine in children. Special care may be needed. Overdosage: If you think you have taken too much of this medicine contact a poison control center or emergency room at once. NOTE: This medicine is only for you. Do not share this medicine with others. What if I miss a dose? If you miss a dose, take it as soon as you can. If it is almost time for your next dose, take only that dose. Do not take double or extra doses. What may interact with this medicine? Do not take this medicine with any of the following medications: -linezolid -MAOIs like Carbex, Eldepryl, Marplan, Nardil, and Parnate -methylene blue -procarbazine This medicine may also interact with the following medications: -diazepam -digoxin -diltiazem -erythromycin -grapefruit juice -haloperidol -medicines for mental depression or mood problems -medicines for seizures like carbamazepine, phenobarbital and phenytoin -nefazodone -other medications for anxiety -rifampin -ritonavir -some antifungal medicines like itraconazole, ketoconazole, and voriconazole -verapamil -warfarin This list may not describe all possible interactions. Give your health care provider a list of all the medicines, herbs, non-prescription drugs, or dietary supplements you use. Also tell them if you smoke, drink alcohol, or use illegal drugs. Some  items may interact with your medicine. What should I watch for  while using this medicine? Visit your doctor or health care professional for regular checks on your progress. It may take 1 to 2 weeks before your anxiety gets better. You may get drowsy or dizzy. Do not drive, use machinery, or do anything that needs mental alertness until you know how this drug affects you. Do not stand or sit up quickly, especially if you are an older patient. This reduces the risk of dizzy or fainting spells. Alcohol can make you more drowsy and dizzy. Avoid alcoholic drinks. What side effects may I notice from receiving this medicine? Side effects that you should report to your doctor or health care professional as soon as possible: -blurred vision or other vision changes -chest pain -confusion -difficulty breathing -feelings of hostility or anger -muscle aches and pains -numbness or tingling in hands or feet -ringing in the ears -skin rash and itching -vomiting -weakness Side effects that usually do not require medical attention (report to your doctor or health care professional if they continue or are bothersome): -disturbed dreams, nightmares -headache -nausea -restlessness or nervousness -sore throat and nasal congestion -stomach upset This list may not describe all possible side effects. Call your doctor for medical advice about side effects. You may report side effects to FDA at 1-800-FDA-1088. Where should I keep my medicine? Keep out of the reach of children. Store at room temperature below 30 degrees C (86 degrees F). Protect from light. Keep container tightly closed. Throw away any unused medicine after the expiration date. NOTE: This sheet is a summary. It may not cover all possible information. If you have questions about this medicine, talk to your doctor, pharmacist, or health care provider.  2014, Elsevier/Gold Standard. (2010-07-27 18:06:11)  Hydroxyzine capsules or tablets What is this medicine? HYDROXYZINE (hye DROX i zeen) is an antihistamine.  This medicine is used to treat allergy symptoms. It is also used to treat anxiety and tension. This medicine can be used with other medicines to induce sleep before surgery. This medicine may be used for other purposes; ask your health care provider or pharmacist if you have questions. COMMON BRAND NAME(S): ANX , Atarax, Rezine, Vistaril What should I tell my health care provider before I take this medicine? They need to know if you have any of these conditions: -any chronic illness -difficulty passing urine -glaucoma -heart disease -kidney disease -liver disease -lung disease -an unusual or allergic reaction to hydroxyzine, cetirizine, other medicines, foods, dyes, or preservatives -pregnant or trying to get pregnant -breast-feeding How should I use this medicine? Take this medicine by mouth with a full glass of water. Follow the directions on the prescription label. You may take this medicine with food or on an empty stomach. Take your medicine at regular intervals. Do not take your medicine more often than directed. Talk to your pediatrician regarding the use of this medicine in children. Special care may be needed. While this drug may be prescribed for children as young as 71 years of age for selected conditions, precautions do apply. Patients over 3 years old may have a stronger reaction and need a smaller dose. Overdosage: If you think you have taken too much of this medicine contact a poison control center or emergency room at once. NOTE: This medicine is only for you. Do not share this medicine with others. What if I miss a dose? If you miss a dose, take it as soon as you can. If it  is almost time for your next dose, take only that dose. Do not take double or extra doses. What may interact with this medicine? -alcohol -barbiturate medicines for sleep or seizures -medicines for colds, allergies -medicines for depression, anxiety, or emotional disturbances -medicines for  pain -medicines for sleep -muscle relaxants This list may not describe all possible interactions. Give your health care provider a list of all the medicines, herbs, non-prescription drugs, or dietary supplements you use. Also tell them if you smoke, drink alcohol, or use illegal drugs. Some items may interact with your medicine. What should I watch for while using this medicine? Tell your doctor or health care professional if your symptoms do not improve. You may get drowsy or dizzy. Do not drive, use machinery, or do anything that needs mental alertness until you know how this medicine affects you. Do not stand or sit up quickly, especially if you are an older patient. This reduces the risk of dizzy or fainting spells. Alcohol may interfere with the effect of this medicine. Avoid alcoholic drinks. Your mouth may get dry. Chewing sugarless gum or sucking hard candy, and drinking plenty of water may help. Contact your doctor if the problem does not go away or is severe. This medicine may cause dry eyes and blurred vision. If you wear contact lenses you may feel some discomfort. Lubricating drops may help. See your eye doctor if the problem does not go away or is severe. If you are receiving skin tests for allergies, tell your doctor you are using this medicine. What side effects may I notice from receiving this medicine? Side effects that you should report to your doctor or health care professional as soon as possible: -fast or irregular heartbeat -difficulty passing urine -seizures -slurred speech or confusion -tremor Side effects that usually do not require medical attention (report to your doctor or health care professional if they continue or are bothersome): -constipation -drowsiness -fatigue -headache -stomach upset This list may not describe all possible side effects. Call your doctor for medical advice about side effects. You may report side effects to FDA at 1-800-FDA-1088. Where  should I keep my medicine? Keep out of the reach of children. Store at room temperature between 15 and 30 degrees C (59 and 86 degrees F). Keep container tightly closed. Throw away any unused medicine after the expiration date. NOTE: This sheet is a summary. It may not cover all possible information. If you have questions about this medicine, talk to your doctor, pharmacist, or health care provider.  2014, Elsevier/Gold Standard. (2008-04-30 14:50:59)

## 2013-12-10 NOTE — MAU Note (Signed)
Has stopped crying; anxiety has decreased dramatically; states that she is feeling better;

## 2013-12-11 LAB — URINE CULTURE

## 2013-12-16 ENCOUNTER — Ambulatory Visit (INDEPENDENT_AMBULATORY_CARE_PROVIDER_SITE_OTHER): Payer: BC Managed Care – PPO | Admitting: Advanced Practice Midwife

## 2013-12-16 VITALS — BP 109/73

## 2013-12-16 DIAGNOSIS — O9989 Other specified diseases and conditions complicating pregnancy, childbirth and the puerperium: Secondary | ICD-10-CM

## 2013-12-16 DIAGNOSIS — N898 Other specified noninflammatory disorders of vagina: Secondary | ICD-10-CM

## 2013-12-16 LAB — POCT URINALYSIS DIP (DEVICE)
Bilirubin Urine: NEGATIVE
Glucose, UA: NEGATIVE mg/dL
Ketones, ur: 40 mg/dL — AB
Nitrite: NEGATIVE
pH: 7.5 (ref 5.0–8.0)

## 2013-12-16 MED ORDER — FLUCONAZOLE 150 MG PO TABS: 150.0000 mg | ORAL_TABLET | Freq: Once | ORAL | Status: DC

## 2013-12-16 NOTE — Progress Notes (Signed)
P= 90 Pt. States she has some white discharge that is very itchy-- believes it is a yeast infection. States when she get yeast infections one diflucan does not clear it, usually 2 or 3 pills.

## 2013-12-16 NOTE — Progress Notes (Signed)
Doing well.  Denies vaginal bleeding, LOF, cramping/contractions. White, clumpy vaginal discharge with itching.  Wet prep done.  Diflucan sent to pharmacy.  Ketones in urine today.  Encouraged pt to drink more water/eat regular meals.

## 2013-12-17 LAB — WET PREP, GENITAL
Clue Cells Wet Prep HPF POC: NONE SEEN
Trich, Wet Prep: NONE SEEN

## 2013-12-18 ENCOUNTER — Telehealth: Payer: Self-pay | Admitting: Advanced Practice Midwife

## 2013-12-18 MED ORDER — BUSPIRONE HCL 5 MG PO TABS: 5.0000 mg | ORAL_TABLET | Freq: Two times a day (BID) | ORAL | Status: DC

## 2013-12-18 NOTE — Telephone Encounter (Signed)
Called pt to change Buspar dosing to BID from TID as originally prescribed in MAU.  Pt  reports she took one dose but did not like the way it made her feel and is not taking the medicine.  She is taking Vistaril PRN which is working well for her. Pt has appointment 01/16/14 in [facility].  Return to MAU as needed.

## 2013-12-18 NOTE — MAU Provider Note (Signed)
Will call pt and make Buspar 5 mg bid. Needs psych follow up.  Attestation of Attending Supervision of Advanced Practitioner (CNM/NP): Evaluation and management procedures were performed by the Advanced Practitioner under my supervision and collaboration. I have reviewed the Advanced Practitioner's note and chart, and I agree with the management and plan.  Ali Mclaurin H. 11:24 AM

## 2013-12-18 NOTE — Addendum Note (Signed)
Addended by: Sharen Counter A on: 12/18/2013 05:55 PM   Modules accepted: Orders

## 2013-12-31 NOTE — L&D Delivery Note (Signed)
Delivery Note At 12:47 AM a viable female was delivered via Vaginal, Spontaneous Delivery (Presentation: Right Occiput Anterior).  APGAR: 9, 9; weight - pending. Placenta status: Intact, Spontaneous.  Cord: 3 vessels with the following complications: None.  Cord pH: N/A.  Anesthesia: Epidural Episiotomy: None Lacerations: 1st degree;Perineal Suture Repair: 3.0 vicryl rapide Est. Blood Loss (mL): 200  Mom to postpartum.  Baby to Couplet care / Skin to Skin.  Thersa Salt 04/04/2014, 1:16 AM

## 2013-12-31 NOTE — L&D Delivery Note (Signed)
Delivery by Dr. Lacinda Axon under direct supervision by me.  Plans to bottlefeed. Desires BTL, papers <30d since signed, OK to go ahead and eat light meal, will discuss w/ attending.  Does want circumcision, unsure if inpatient vs. outpatient.   Roma Schanz, CNM, [facility] 04/04/2014 1:49 AM

## 2014-01-13 ENCOUNTER — Encounter: Payer: BC Managed Care – PPO | Admitting: Family Medicine

## 2014-01-13 ENCOUNTER — Telehealth: Payer: Self-pay

## 2014-01-13 NOTE — Telephone Encounter (Signed)
Pt. Called stating she has been having pelvic pain for the last two days, spotting and a yellow discharge. She states she believes the spotting is r/t to the irritation from the discharge but she is unsure. Pt. Was unable to be seen at clinic today because she did not have child care for her children. Unable to get pt. In clinic later today. Advised pt. To find child care if possible and go to MAU to test for infection. Pt. Verbalized understanding and stated she would.

## 2014-01-20 ENCOUNTER — Encounter: Payer: Self-pay | Admitting: Obstetrics and Gynecology

## 2014-01-20 ENCOUNTER — Ambulatory Visit (INDEPENDENT_AMBULATORY_CARE_PROVIDER_SITE_OTHER): Payer: BC Managed Care – PPO | Admitting: Obstetrics and Gynecology

## 2014-01-20 VITALS — BP 109/69 | Temp 97.4°F | Wt 136.6 lb

## 2014-01-20 DIAGNOSIS — O99342 Other mental disorders complicating pregnancy, second trimester: Principal | ICD-10-CM

## 2014-01-20 DIAGNOSIS — F419 Anxiety disorder, unspecified: Secondary | ICD-10-CM

## 2014-01-20 DIAGNOSIS — F411 Generalized anxiety disorder: Secondary | ICD-10-CM

## 2014-01-20 DIAGNOSIS — O99340 Other mental disorders complicating pregnancy, unspecified trimester: Secondary | ICD-10-CM

## 2014-01-20 LAB — POCT URINALYSIS DIP (DEVICE)
Bilirubin Urine: NEGATIVE
Glucose, UA: NEGATIVE mg/dL
Nitrite: NEGATIVE
Protein, ur: 30 mg/dL — AB
Specific Gravity, Urine: 1.02 (ref 1.005–1.030)
Urobilinogen, UA: 1 mg/dL (ref 0.0–1.0)
pH: 7 (ref 5.0–8.0)

## 2014-01-20 MED ORDER — GLUCOSE BLOOD VI STRP: ORAL_STRIP | Status: DC

## 2014-01-20 NOTE — Progress Notes (Signed)
P=  Pt reports she cannot sleep due to restless leg syndrome and yeast infection continue to reoccur.  Pt is undecided about Tdap vaccine.  Will have lab appointment for 28 wk labs.

## 2014-01-20 NOTE — Patient Instructions (Signed)
Second Trimester of Pregnancy The second trimester is from week 13 through week 28, months 4 through 6. The second trimester is often a time when you feel your best. Your body has also adjusted to being pregnant, and you begin to feel better physically. Usually, morning sickness has lessened or quit completely, you may have more energy, and you may have an increase in appetite. The second trimester is also a time when the fetus is growing rapidly. At the end of the sixth month, the fetus is about 9 inches long and weighs about 1 pounds. You will likely begin to feel the baby move (quickening) between 18 and 20 weeks of the pregnancy. BODY CHANGES Your body goes through many changes during pregnancy. The changes vary from woman to woman.   Your weight will continue to increase. You will notice your lower abdomen bulging out.  You may begin to get stretch marks on your hips, abdomen, and breasts.  You may develop headaches that can be relieved by medicines approved by your caregiver.  You may urinate more often because the fetus is pressing on your bladder.  You may develop or continue to have heartburn as a result of your pregnancy.  You may develop constipation because certain hormones are causing the muscles that push waste through your intestines to slow down.  You may develop hemorrhoids or swollen, bulging veins (varicose veins).  You may have back pain because of the weight gain and pregnancy hormones relaxing your joints between the bones in your pelvis and as a result of a shift in weight and the muscles that support your balance.  Your breasts will continue to grow and be tender.  Your gums may bleed and may be sensitive to brushing and flossing.  Dark spots or blotches (chloasma, mask of pregnancy) may develop on your face. This will likely fade after the baby is born.  A dark line from your belly button to the pubic area (linea nigra) may appear. This will likely fade after the  baby is born. WHAT TO EXPECT AT YOUR PRENATAL VISITS During a routine prenatal visit:  You will be weighed to make sure you and the fetus are growing normally.  Your blood pressure will be taken.  Your abdomen will be measured to track your baby's growth.  The fetal heartbeat will be listened to.  Any test results from the previous visit will be discussed. Your caregiver may ask you:  How you are feeling.  If you are feeling the baby move.  If you have had any abnormal symptoms, such as leaking fluid, bleeding, severe headaches, or abdominal cramping.  If you have any questions. Other tests that may be performed during your second trimester include:  Blood tests that check for:  Low iron levels (anemia).  Gestational diabetes (between 24 and 28 weeks).  Rh antibodies.  Urine tests to check for infections, diabetes, or protein in the urine.  An ultrasound to confirm the proper growth and development of the baby.  An amniocentesis to check for possible genetic problems.  Fetal screens for spina bifida and Down syndrome. HOME CARE INSTRUCTIONS   Avoid all smoking, herbs, alcohol, and unprescribed drugs. These chemicals affect the formation and growth of the baby.  Follow your caregiver's instructions regarding medicine use. There are medicines that are either safe or unsafe to take during pregnancy.  Exercise only as directed by your caregiver. Experiencing uterine cramps is a good sign to stop exercising.  Continue to eat regular,   healthy meals.  Wear a good support bra for breast tenderness.  Do not use hot tubs, steam rooms, or saunas.  Wear your seat belt at all times when driving.  Avoid raw meat, uncooked cheese, cat litter boxes, and soil used by cats. These carry germs that can cause birth defects in the baby.  Take your prenatal vitamins.  Try taking a stool softener (if your caregiver approves) if you develop constipation. Eat more high-fiber foods,  such as fresh vegetables or fruit and whole grains. Drink plenty of fluids to keep your urine clear or pale yellow.  Take warm sitz baths to soothe any pain or discomfort caused by hemorrhoids. Use hemorrhoid cream if your caregiver approves.  If you develop varicose veins, wear support hose. Elevate your feet for 15 minutes, 3 4 times a day. Limit salt in your diet.  Avoid heavy lifting, wear low heel shoes, and practice good posture.  Rest with your legs elevated if you have leg cramps or low back pain.  Visit your dentist if you have not gone yet during your pregnancy. Use a soft toothbrush to brush your teeth and be gentle when you floss.  A sexual relationship may be continued unless your caregiver directs you otherwise.  Continue to go to all your prenatal visits as directed by your caregiver. SEEK MEDICAL CARE IF:   You have dizziness.  You have mild pelvic cramps, pelvic pressure, or nagging pain in the abdominal area.  You have persistent nausea, vomiting, or diarrhea.  You have a bad smelling vaginal discharge.  You have pain with urination. SEEK IMMEDIATE MEDICAL CARE IF:   You have a fever.  You are leaking fluid from your vagina.  You have spotting or bleeding from your vagina.  You have severe abdominal cramping or pain.  You have rapid weight gain or loss.  You have shortness of breath with chest pain.  You notice sudden or extreme swelling of your face, hands, ankles, feet, or legs.  You have not felt your baby move in over an hour.  You have severe headaches that do not go away with medicine.  You have vision changes. Document Released: 12/11/2001 Document Revised: 08/19/2013 Document Reviewed: 02/17/2013 [employer] Patient Information 2014 [employer].  

## 2014-01-20 NOTE — Progress Notes (Signed)
Declines 1 hr glucola today and will come this week lab only. Discussed leg sx and sleep disruption> try benadryl. No yeast sx today but concerned she's had recurrences. Advised normal vag pH lubricant product.

## 2014-01-26 ENCOUNTER — Other Ambulatory Visit: Payer: BC Managed Care – PPO

## 2014-01-26 DIAGNOSIS — O0932 Supervision of pregnancy with insufficient antenatal care, second trimester: Secondary | ICD-10-CM

## 2014-01-26 LAB — CBC
HEMATOCRIT: 25.9 % — AB (ref 36.0–46.0)
Hemoglobin: 8.5 g/dL — ABNORMAL LOW (ref 12.0–15.0)
MCH: 28.1 pg (ref 26.0–34.0)
MCHC: 32.8 g/dL (ref 30.0–36.0)
MCV: 85.5 fL (ref 78.0–100.0)
PLATELETS: 220 10*3/uL (ref 150–400)
RBC: 3.03 MIL/uL — ABNORMAL LOW (ref 3.87–5.11)
RDW: 15.7 % — ABNORMAL HIGH (ref 11.5–15.5)
WBC: 9.1 10*3/uL (ref 4.0–10.5)

## 2014-01-27 LAB — HIV ANTIBODY (ROUTINE TESTING W REFLEX): HIV: NONREACTIVE

## 2014-01-27 LAB — GLUCOSE TOLERANCE, 1 HOUR (50G) W/O FASTING: Glucose, 1 Hour GTT: 100 mg/dL (ref 70–140)

## 2014-01-27 LAB — RPR

## 2014-01-28 ENCOUNTER — Encounter: Payer: Self-pay | Admitting: Obstetrics & Gynecology

## 2014-02-03 ENCOUNTER — Ambulatory Visit (INDEPENDENT_AMBULATORY_CARE_PROVIDER_SITE_OTHER): Payer: BC Managed Care – PPO | Admitting: Obstetrics and Gynecology

## 2014-02-03 ENCOUNTER — Encounter: Payer: Self-pay | Admitting: Obstetrics and Gynecology

## 2014-02-03 VITALS — BP 111/67 | Temp 97.6°F | Wt 139.8 lb

## 2014-02-03 DIAGNOSIS — O0930 Supervision of pregnancy with insufficient antenatal care, unspecified trimester: Secondary | ICD-10-CM

## 2014-02-03 DIAGNOSIS — O0932 Supervision of pregnancy with insufficient antenatal care, second trimester: Secondary | ICD-10-CM

## 2014-02-03 LAB — POCT URINALYSIS DIP (DEVICE)
BILIRUBIN URINE: NEGATIVE
GLUCOSE, UA: NEGATIVE mg/dL
Ketones, ur: NEGATIVE mg/dL
NITRITE: NEGATIVE
Protein, ur: NEGATIVE mg/dL
Specific Gravity, Urine: 1.02 (ref 1.005–1.030)
Urobilinogen, UA: 0.2 mg/dL (ref 0.0–1.0)
pH: 7 (ref 5.0–8.0)

## 2014-02-03 NOTE — Progress Notes (Signed)
p-[id number] 

## 2014-02-03 NOTE — Patient Instructions (Signed)
Contraception Choices Contraception (birth control) is the use of any methods or devices to prevent pregnancy. Below are some methods to help avoid pregnancy. HORMONAL METHODS   Contraceptive implant This is a thin, plastic tube containing progesterone hormone. It does not contain estrogen hormone. Your health care provider inserts the tube in the inner part of the upper arm. The tube can remain in place for up to 3 years. After 3 years, the implant must be removed. The implant prevents the ovaries from releasing an egg (ovulation), thickens the cervical mucus to prevent sperm from entering the uterus, and thins the lining of the inside of the uterus.  Progesterone-only injections These injections are given every 3 months by your health care provider to prevent pregnancy. This synthetic progesterone hormone stops the ovaries from releasing eggs. It also thickens cervical mucus and changes the uterine lining. This makes it harder for sperm to survive in the uterus.  Birth control pills These pills contain estrogen and progesterone hormone. They work by preventing the ovaries from releasing eggs (ovulation). They also cause the cervical mucus to thicken, preventing the sperm from entering the uterus. Birth control pills are prescribed by a health care provider.Birth control pills can also be used to treat heavy periods.  Minipill This type of birth control pill contains only the progesterone hormone. They are taken every day of each month and must be prescribed by your health care provider.  Birth control patch The patch contains hormones similar to those in birth control pills. It must be changed once a week and is prescribed by a health care provider.  Vaginal ring The ring contains hormones similar to those in birth control pills. It is left in the vagina for 3 weeks, removed for 1 week, and then a new one is put back in place. The patient must be comfortable inserting and removing the ring from the  vagina.A health care provider's prescription is necessary.  Emergency contraception Emergency contraceptives prevent pregnancy after unprotected sexual intercourse. This pill can be taken right after sex or up to 5 days after unprotected sex. It is most effective the sooner you take the pills after having sexual intercourse. Most emergency contraceptive pills are available without a prescription. Check with your pharmacist. Do not use emergency contraception as your only form of birth control. BARRIER METHODS   Female condom This is a thin sheath (latex or rubber) that is worn over the penis during sexual intercourse. It can be used with spermicide to increase effectiveness.  Female condom. This is a soft, loose-fitting sheath that is put into the vagina before sexual intercourse.  Diaphragm This is a soft, latex, dome-shaped barrier that must be fitted by a health care provider. It is inserted into the vagina, along with a spermicidal jelly. It is inserted before intercourse. The diaphragm should be left in the vagina for 6 to 8 hours after intercourse.  Cervical cap This is a round, soft, latex or plastic cup that fits over the cervix and must be fitted by a health care provider. The cap can be left in place for up to 48 hours after intercourse.  Sponge This is a soft, circular piece of polyurethane foam. The sponge has spermicide in it. It is inserted into the vagina after wetting it and before sexual intercourse.  Spermicides These are chemicals that kill or block sperm from entering the cervix and uterus. They come in the form of creams, jellies, suppositories, foam, or tablets. They do not require a   prescription. They are inserted into the vagina with an applicator before having sexual intercourse. The process must be repeated every time you have sexual intercourse. INTRAUTERINE CONTRACEPTION  Intrauterine device (IUD) This is a T-shaped device that is put in a woman's uterus during a  menstrual period to prevent pregnancy. There are 2 types:  Copper IUD This type of IUD is wrapped in copper wire and is placed inside the uterus. Copper makes the uterus and fallopian tubes produce a fluid that kills sperm. It can stay in place for 10 years.  Hormone IUD This type of IUD contains the hormone progestin (synthetic progesterone). The hormone thickens the cervical mucus and prevents sperm from entering the uterus, and it also thins the uterine lining to prevent implantation of a fertilized egg. The hormone can weaken or kill the sperm that get into the uterus. It can stay in place for 3 5 years, depending on which type of IUD is used. PERMANENT METHODS OF CONTRACEPTION  Female tubal ligation This is when the woman's fallopian tubes are surgically sealed, tied, or blocked to prevent the egg from traveling to the uterus.  Hysteroscopic sterilization This involves placing a small coil or insert into each fallopian tube. Your doctor uses a technique called hysteroscopy to do the procedure. The device causes scar tissue to form. This results in permanent blockage of the fallopian tubes, so the sperm cannot fertilize the egg. It takes about 3 months after the procedure for the tubes to become blocked. You must use another form of birth control for these 3 months.  Female sterilization This is when the female has the tubes that carry sperm tied off (vasectomy).This blocks sperm from entering the vagina during sexual intercourse. After the procedure, the man can still ejaculate fluid (semen). NATURAL PLANNING METHODS  Natural family planning This is not having sexual intercourse or using a barrier method (condom, diaphragm, cervical cap) on days the woman could become pregnant.  Calendar method This is keeping track of the length of each menstrual cycle and identifying when you are fertile.  Ovulation method This is avoiding sexual intercourse during ovulation.  Symptothermal method This is  avoiding sexual intercourse during ovulation, using a thermometer and ovulation symptoms.  Post ovulation method This is timing sexual intercourse after you have ovulated. Regardless of which type or method of contraception you choose, it is important that you use condoms to protect against the transmission of sexually transmitted infections (STIs). Talk with your health care provider about which form of contraception is most appropriate for you. Document Released: 12/17/2005 Document Revised: 08/19/2013 Document Reviewed: 06/11/2013 ExitCare Patient Information 2014 [employer].  

## 2014-02-03 NOTE — Progress Notes (Signed)
Doing well. 1 hr was 100. Leg and sleep issues a little better. No dysuria or irritative vaginitis.

## 2014-02-10 ENCOUNTER — Telehealth: Payer: Self-pay | Admitting: *Deleted

## 2014-02-10 DIAGNOSIS — B373 Candidiasis of vulva and vagina: Secondary | ICD-10-CM

## 2014-02-10 DIAGNOSIS — B3731 Acute candidiasis of vulva and vagina: Secondary | ICD-10-CM

## 2014-02-10 MED ORDER — FLUCONAZOLE 150 MG PO TABS: 150.0000 mg | ORAL_TABLET | Freq: Once | ORAL | Status: DC

## 2014-02-10 NOTE — Telephone Encounter (Signed)
Patient called complaining of vaginal itching and thick white discharge. She is not sexually active presently so she knows that that is not the issue. I called in diflucan for her.

## 2014-02-17 ENCOUNTER — Encounter: Payer: BC Managed Care – PPO | Admitting: Obstetrics and Gynecology

## 2014-02-17 ENCOUNTER — Encounter: Payer: Self-pay | Admitting: Obstetrics and Gynecology

## 2014-02-18 ENCOUNTER — Encounter: Payer: Self-pay | Admitting: *Deleted

## 2014-03-03 ENCOUNTER — Encounter: Payer: Self-pay | Admitting: *Deleted

## 2014-03-03 ENCOUNTER — Encounter: Payer: Self-pay | Admitting: Obstetrics & Gynecology

## 2014-03-03 ENCOUNTER — Ambulatory Visit (INDEPENDENT_AMBULATORY_CARE_PROVIDER_SITE_OTHER): Payer: Medicaid Other | Admitting: Obstetrics and Gynecology

## 2014-03-03 VITALS — BP 116/66 | Temp 97.8°F | Wt 139.7 lb

## 2014-03-03 DIAGNOSIS — O0932 Supervision of pregnancy with insufficient antenatal care, second trimester: Secondary | ICD-10-CM

## 2014-03-03 DIAGNOSIS — B3731 Acute candidiasis of vulva and vagina: Secondary | ICD-10-CM

## 2014-03-03 DIAGNOSIS — O4700 False labor before 37 completed weeks of gestation, unspecified trimester: Secondary | ICD-10-CM

## 2014-03-03 DIAGNOSIS — O35EXX0 Maternal care for other (suspected) fetal abnormality and damage, fetal genitourinary anomalies, not applicable or unspecified: Secondary | ICD-10-CM

## 2014-03-03 DIAGNOSIS — O289 Unspecified abnormal findings on antenatal screening of mother: Secondary | ICD-10-CM

## 2014-03-03 DIAGNOSIS — B373 Candidiasis of vulva and vagina: Secondary | ICD-10-CM

## 2014-03-03 DIAGNOSIS — O0930 Supervision of pregnancy with insufficient antenatal care, unspecified trimester: Secondary | ICD-10-CM

## 2014-03-03 DIAGNOSIS — Z23 Encounter for immunization: Secondary | ICD-10-CM

## 2014-03-03 DIAGNOSIS — O358XX0 Maternal care for other (suspected) fetal abnormality and damage, not applicable or unspecified: Secondary | ICD-10-CM

## 2014-03-03 DIAGNOSIS — L299 Pruritus, unspecified: Secondary | ICD-10-CM

## 2014-03-03 LAB — POCT URINALYSIS DIP (DEVICE)
BILIRUBIN URINE: NEGATIVE
Glucose, UA: NEGATIVE mg/dL
KETONES UR: NEGATIVE mg/dL
Nitrite: NEGATIVE
PH: 7.5 (ref 5.0–8.0)
Protein, ur: NEGATIVE mg/dL
Specific Gravity, Urine: 1.02 (ref 1.005–1.030)
Urobilinogen, UA: 0.2 mg/dL (ref 0.0–1.0)

## 2014-03-03 LAB — OB RESULTS CONSOLE GC/CHLAMYDIA
Chlamydia: NEGATIVE
Gonorrhea: NEGATIVE

## 2014-03-03 MED ORDER — ZOLPIDEM TARTRATE 5 MG PO TABS: 5.0000 mg | ORAL_TABLET | Freq: Every evening | ORAL | Status: DC | PRN

## 2014-03-03 NOTE — Progress Notes (Signed)
S: 60FU [id number] @[redacted]w[redacted]d  for ROBV  Reports she has not been sleeping well and has restless legs and profuse itching. Gets naps 70min-1 hour during the day. She had green discharge a few days ago, tried refresh and that made it worse. Has slight odor. Currently, she has white and thick discharge. She reports som bleeding around her vagina, but no blood from within and says it is from a yeast infection. +fetal movements. +contractions a few times out of the day, 1-2 every hour. Feels like tightening of her abdomen.  P" Needs repeat ultrasound today pyelectasis on 19w ultrasound. Will obtain bile acids to assess for cholestasis and prescribe Ambien 5mg  for sleep.

## 2014-03-03 NOTE — Progress Notes (Signed)
SSE: copious greenish discharge>WP, GC/CT sent and discharge swabbed. Evaluation and management procedures were performed by Resident physician under my supervision/collaboration. Chart reviewed, patient examined by me and I agree with management and plan.

## 2014-03-03 NOTE — Patient Instructions (Signed)
Monilial Vaginitis  Vaginitis in a soreness, swelling and redness (inflammation) of the vagina and vulva. Monilial vaginitis is not a sexually transmitted infection.  CAUSES   Yeast vaginitis is caused by yeast (candida) that is normally found in your vagina. With a yeast infection, the candida has overgrown in number to a point that upsets the chemical balance.  SYMPTOMS   · White, thick vaginal discharge.  · Swelling, itching, redness and irritation of the vagina and possibly the lips of the vagina (vulva).  · Burning or painful urination.  · Painful intercourse.  DIAGNOSIS   Things that may contribute to monilial vaginitis are:  · Postmenopausal and virginal states.  · Pregnancy.  · Infections.  · Being tired, sick or stressed, especially if you had monilial vaginitis in the past.  · Diabetes. Good control will help lower the chance.  · Birth control pills.  · Tight fitting garments.  · Using bubble bath, feminine sprays, douches or deodorant tampons.  · Taking certain medications that kill germs (antibiotics).  · Sporadic recurrence can occur if you become ill.  TREATMENT   Your caregiver will give you medication.  · There are several kinds of anti monilial vaginal creams and suppositories specific for monilial vaginitis. For recurrent yeast infections, use a suppository or cream in the vagina 2 times a week, or as directed.  · Anti-monilial or steroid cream for the itching or irritation of the vulva may also be used. Get your caregiver's permission.  · Painting the vagina with methylene blue solution may help if the monilial cream does not work.  · Eating yogurt may help prevent monilial vaginitis.  HOME CARE INSTRUCTIONS   · Finish all medication as prescribed.  · Do not have sex until treatment is completed or after your caregiver tells you it is okay.  · Take warm sitz baths.  · Do not douche.  · Do not use tampons, especially scented ones.  · Wear cotton underwear.  · Avoid tight pants and panty  hose.  · Tell your sexual partner that you have a yeast infection. They should go to their caregiver if they have symptoms such as mild rash or itching.  · Your sexual partner should be treated as well if your infection is difficult to eliminate.  · Practice safer sex. Use condoms.  · Some vaginal medications cause latex condoms to fail. Vaginal medications that harm condoms are:  · Cleocin cream.  · Butoconazole (Femstat®).  · Terconazole (Terazol®) vaginal suppository.  · Miconazole (Monistat®) (may be purchased over the counter).  SEEK MEDICAL CARE IF:   · You have a temperature by mouth above 102° F (38.9° C).  · The infection is getting worse after 2 days of treatment.  · The infection is not getting better after 3 days of treatment.  · You develop blisters in or around your vagina.  · You develop vaginal bleeding, and it is not your menstrual period.  · You have pain when you urinate.  · You develop intestinal problems.  · You have pain with sexual intercourse.  Document Released: 09/26/2005 Document Revised: 03/10/2012 Document Reviewed: 06/10/2009  ExitCare® Patient Information ©2014 [employer].

## 2014-03-03 NOTE — Progress Notes (Signed)
P=94 Pt. C/o of white, greenish, thick, itchy discharge-- states Diflucan cleared it up for 2 days and then it came back.  C/o of intermitent lower abdominal/pelvic pressure. C/o of being unable to sleep- benadryl doesn't help and states the flexeril has caused her to have restless legs.  Declines flu today because she states "I'm just irritated. Can i get it next time?" Informed her she could and information sheet given. Flu shot declined.

## 2014-03-04 ENCOUNTER — Telehealth: Payer: Self-pay

## 2014-03-04 DIAGNOSIS — B373 Candidiasis of vulva and vagina: Secondary | ICD-10-CM | POA: Insufficient documentation

## 2014-03-04 DIAGNOSIS — B379 Candidiasis, unspecified: Secondary | ICD-10-CM

## 2014-03-04 DIAGNOSIS — B3731 Acute candidiasis of vulva and vagina: Secondary | ICD-10-CM | POA: Insufficient documentation

## 2014-03-04 LAB — WET PREP, GENITAL
Trich, Wet Prep: NONE SEEN
WBC WET PREP: NONE SEEN

## 2014-03-04 LAB — GC/CHLAMYDIA PROBE AMP
CT PROBE, AMP APTIMA: NEGATIVE
GC PROBE AMP APTIMA: NEGATIVE

## 2014-03-04 MED ORDER — FLUCONAZOLE 150 MG PO TABS: ORAL_TABLET | ORAL | Status: DC

## 2014-03-04 NOTE — Telephone Encounter (Signed)
Message copied by Geanie Logan on Thu Mar 04, 2014 11:13 AM ------      Message from: POE, [place]      Created: Thu Mar 04, 2014  9:32 AM       Please rx diflucan 150 mg po and repeat in 2 days prn Disp #2 NR ------

## 2014-03-04 NOTE — Telephone Encounter (Signed)
Called pt. Went straight to [facility]. Left message stating we are calling with results and of information of a prescription that has been sent to your pharmacy, please call clinic and leave message stating whether or not we can leave detailed information on voicemail.

## 2014-03-05 LAB — BILE ACIDS, TOTAL: BILE ACIDS TOTAL: 12 umol/L (ref 0–19)

## 2014-03-06 ENCOUNTER — Inpatient Hospital Stay (HOSPITAL_COMMUNITY)
Admission: AD | Admit: 2014-03-06 | Discharge: 2014-03-06 | Disposition: A | Discharge status: Home / Self Care | Payer: Medicaid Other | Source: Ambulatory Visit | Attending: Obstetrics & Gynecology | Admitting: Obstetrics & Gynecology

## 2014-03-06 DIAGNOSIS — M549 Dorsalgia, unspecified: Secondary | ICD-10-CM | POA: Insufficient documentation

## 2014-03-06 DIAGNOSIS — O98819 Other maternal infectious and parasitic diseases complicating pregnancy, unspecified trimester: Secondary | ICD-10-CM | POA: Insufficient documentation

## 2014-03-06 DIAGNOSIS — A599 Trichomoniasis, unspecified: Secondary | ICD-10-CM | POA: Diagnosis present

## 2014-03-06 DIAGNOSIS — O23599 Infection of other part of genital tract in pregnancy, unspecified trimester: Secondary | ICD-10-CM

## 2014-03-06 DIAGNOSIS — F411 Generalized anxiety disorder: Secondary | ICD-10-CM | POA: Insufficient documentation

## 2014-03-06 DIAGNOSIS — Z87891 Personal history of nicotine dependence: Secondary | ICD-10-CM | POA: Insufficient documentation

## 2014-03-06 DIAGNOSIS — K831 Obstruction of bile duct: Secondary | ICD-10-CM | POA: Diagnosis present

## 2014-03-06 DIAGNOSIS — O26613 Liver and biliary tract disorders in pregnancy, third trimester: Secondary | ICD-10-CM

## 2014-03-06 DIAGNOSIS — O26643 Intrahepatic cholestasis of pregnancy, third trimester: Secondary | ICD-10-CM | POA: Diagnosis present

## 2014-03-06 DIAGNOSIS — F3289 Other specified depressive episodes: Secondary | ICD-10-CM | POA: Insufficient documentation

## 2014-03-06 DIAGNOSIS — K838 Other specified diseases of biliary tract: Secondary | ICD-10-CM | POA: Insufficient documentation

## 2014-03-06 DIAGNOSIS — R079 Chest pain, unspecified: Secondary | ICD-10-CM | POA: Insufficient documentation

## 2014-03-06 DIAGNOSIS — O99891 Other specified diseases and conditions complicating pregnancy: Secondary | ICD-10-CM | POA: Insufficient documentation

## 2014-03-06 DIAGNOSIS — R55 Syncope and collapse: Secondary | ICD-10-CM | POA: Insufficient documentation

## 2014-03-06 DIAGNOSIS — F329 Major depressive disorder, single episode, unspecified: Secondary | ICD-10-CM | POA: Insufficient documentation

## 2014-03-06 DIAGNOSIS — A5901 Trichomonal vulvovaginitis: Secondary | ICD-10-CM | POA: Insufficient documentation

## 2014-03-06 DIAGNOSIS — O9989 Other specified diseases and conditions complicating pregnancy, childbirth and the puerperium: Secondary | ICD-10-CM

## 2014-03-06 DIAGNOSIS — R0602 Shortness of breath: Secondary | ICD-10-CM | POA: Insufficient documentation

## 2014-03-06 LAB — URINALYSIS, ROUTINE W REFLEX MICROSCOPIC
BILIRUBIN URINE: NEGATIVE
Glucose, UA: NEGATIVE mg/dL
Hgb urine dipstick: NEGATIVE
KETONES UR: 40 mg/dL — AB
NITRITE: NEGATIVE
PROTEIN: NEGATIVE mg/dL
Specific Gravity, Urine: 1.01 (ref 1.005–1.030)
Urobilinogen, UA: 0.2 mg/dL (ref 0.0–1.0)
pH: 7 (ref 5.0–8.0)

## 2014-03-06 LAB — URINE MICROSCOPIC-ADD ON

## 2014-03-06 LAB — RAPID URINE DRUG SCREEN, HOSP PERFORMED
AMPHETAMINES: NOT DETECTED
BARBITURATES: NOT DETECTED
BENZODIAZEPINES: NOT DETECTED
Cocaine: NOT DETECTED
Opiates: NOT DETECTED
Tetrahydrocannabinol: POSITIVE — AB

## 2014-03-06 LAB — CBC WITH DIFFERENTIAL/PLATELET
BASOS ABS: 0 10*3/uL (ref 0.0–0.1)
Basophils Relative: 0 % (ref 0–1)
Eosinophils Absolute: 0.1 10*3/uL (ref 0.0–0.7)
Eosinophils Relative: 1 % (ref 0–5)
HCT: 25.5 % — ABNORMAL LOW (ref 36.0–46.0)
Hemoglobin: 8.4 g/dL — ABNORMAL LOW (ref 12.0–15.0)
LYMPHS ABS: 0.8 10*3/uL (ref 0.7–4.0)
LYMPHS PCT: 8 % — AB (ref 12–46)
MCH: 26.8 pg (ref 26.0–34.0)
MCHC: 32.9 g/dL (ref 30.0–36.0)
MCV: 81.5 fL (ref 78.0–100.0)
Monocytes Absolute: 0.4 10*3/uL (ref 0.1–1.0)
Monocytes Relative: 5 % (ref 3–12)
NEUTROS PCT: 86 % — AB (ref 43–77)
Neutro Abs: 8.1 10*3/uL — ABNORMAL HIGH (ref 1.7–7.7)
PLATELETS: 178 10*3/uL (ref 150–400)
RBC: 3.13 MIL/uL — AB (ref 3.87–5.11)
RDW: 14 % (ref 11.5–15.5)
WBC: 9.4 10*3/uL (ref 4.0–10.5)

## 2014-03-06 LAB — COMPREHENSIVE METABOLIC PANEL
ALK PHOS: 134 U/L — AB (ref 39–117)
ALT: 10 U/L (ref 0–35)
AST: 22 U/L (ref 0–37)
Albumin: 2.7 g/dL — ABNORMAL LOW (ref 3.5–5.2)
BUN: 5 mg/dL — ABNORMAL LOW (ref 6–23)
CALCIUM: 8.6 mg/dL (ref 8.4–10.5)
CO2: 18 meq/L — AB (ref 19–32)
Chloride: 103 mEq/L (ref 96–112)
Creatinine, Ser: 0.44 mg/dL — ABNORMAL LOW (ref 0.50–1.10)
GLUCOSE: 107 mg/dL — AB (ref 70–99)
POTASSIUM: 3.6 meq/L — AB (ref 3.7–5.3)
SODIUM: 133 meq/L — AB (ref 137–147)
TOTAL PROTEIN: 5.9 g/dL — AB (ref 6.0–8.3)
Total Bilirubin: 0.3 mg/dL (ref 0.3–1.2)

## 2014-03-06 LAB — GLUCOSE, CAPILLARY: Glucose-Capillary: 83 mg/dL (ref 70–99)

## 2014-03-06 MED ORDER — METRONIDAZOLE 500 MG PO TABS: 500.0000 mg | ORAL_TABLET | Freq: Two times a day (BID) | ORAL | Status: DC

## 2014-03-06 MED ORDER — LACTATED RINGERS IV BOLUS (SEPSIS)
1000.0000 mL | Freq: Once | INTRAVENOUS | Status: AC
  Administered 2014-03-06: 1000 mL via INTRAVENOUS

## 2014-03-06 MED ORDER — GI COCKTAIL ~~LOC~~
30.0000 mL | Freq: Once | ORAL | Status: AC
Start: 2014-03-06 — End: 2014-03-06
  Administered 2014-03-06: 30 mL via ORAL
  Filled 2014-03-06: qty 30

## 2014-03-06 MED ORDER — FAMOTIDINE 40 MG PO TABS: 40.0000 mg | ORAL_TABLET | Freq: Every day | ORAL | Status: DC

## 2014-03-06 MED ORDER — URSODIOL 300 MG PO CAPS: 300.0000 mg | ORAL_CAPSULE | Freq: Two times a day (BID) | ORAL | Status: DC

## 2014-03-06 MED ORDER — ACETAMINOPHEN 325 MG PO TABS
650.0000 mg | ORAL_TABLET | Freq: Once | ORAL | Status: AC
  Administered 2014-03-06: 650 mg via ORAL
  Filled 2014-03-06: qty 2

## 2014-03-06 NOTE — Progress Notes (Signed)
Dr. Leslie Andrea notified of pt's bp's, RN assisted pt from lobby via wc, pt pale, IV being started, Dr. Leslie Andrea will come see pt.

## 2014-03-06 NOTE — MAU Note (Signed)
28 yo, G5P2 at [redacted]w[redacted]d, presents to MAU with c/o body aches and weakness since this morning; denies cough, congestion. Did not take flu shot.  Denies VB, LOF, contractions. Reports +FM.

## 2014-03-06 NOTE — MAU Note (Signed)
Odom notified at bedside of CBG 83

## 2014-03-06 NOTE — MAU Provider Note (Signed)
First Provider Initiated Contact with Patient 03/06/14 1923      Chief Complaint:  No chief complaint on file.   Krista Vaughn is  28 y.o. [id number] at [redacted]w[redacted]d presents complaining of No chief complaint on file. .  She states none contractions are associated with none vaginal bleeding, intact membranes, along with active fetal movement.   Pt woke this AM with complaints of back pain, myalgia and Chest pain. Pt felt weak and like she was going to pass out. Needed assistance to car for transport here. Pt also head ache and SOB, she felt warm all over and very light headed. No recent illness.   Obstetrical/Gynecological History: OB History   Grav Para Term Preterm Abortions TAB SAB Ect Mult Living   5 2 2  2 2  0   2     Past Medical History: Past Medical History  Diagnosis Date  . Chlamydia   . Bronchitis   . Depression   . Anxiety   . Migraine     Past Surgical History: Past Surgical History  Procedure Laterality Date  . Induced abortion    . Therapeutic abortion      Family History: Family History  Problem Relation Age of Onset  . Down syndrome Sister     Social History: History  Substance Use Topics  . Smoking status: Former Smoker    Types: Cigarettes  . Smokeless tobacco: Never Used  . Alcohol Use: No    Allergies:  Allergies  Allergen Reactions  . Decadron [Dexamethasone] Other (See Comments)    Makes her skin crawl    Meds:  Prescriptions prior to admission  Medication Sig Dispense Refill  . busPIRone (BUSPAR) 5 MG tablet Take 1-2 tablets (5-10 mg total) by mouth 2 (two) times daily.  90 tablet  0  . cyclobenzaprine (FLEXERIL) 10 MG tablet Take 0.5-1 tablets (5-10 mg total) by mouth 3 (three) times daily as needed for muscle spasms.  30 tablet  5  . diphenhydrAMINE (BENADRYL) 25 MG tablet Take 25 mg by mouth every 6 (six) hours as needed for allergies.      . fluconazole (DIFLUCAN) 150 MG tablet Take one tablet today and then repeat in 48 hours.  2  tablet  0  . glucose blood test strip Use as instructed  100 each  12  . hydrOXYzine (ATARAX/VISTARIL) 50 MG tablet Take 1 tablet (50 mg total) by mouth 3 (three) times daily as needed.  30 tablet  0  . Prenatal Vit-Min-FA-Fish Oil ([facility] PRENATAL GUMMY PO) Take 1 each by mouth daily.      . sertraline (ZOLOFT) 50 MG tablet Take 1 tablet (50 mg total) by mouth daily.  30 tablet  2  . zolpidem (AMBIEN) 5 MG tablet Take 1 tablet (5 mg total) by mouth at bedtime as needed for sleep.  15 tablet  0    Review of Systems -   Review of Systems  Constitutional: Negative for fever, chills, weight loss, + malaise/fatigue and diaphoresis.  HENT: Negative for  congestion, sore throat,  Eyes: Negative for vision change Respiratory: Negative for cough, sputum production, + shortness of breath, wheezing and stridor.   Cardiovascular: Negative for +Chest pain, neg for palpitations, orthopnea,  leg swelling  Gastrointestinal:+heartburn Negative for abdominal pain , nausea, vomiting, diarrhea, constipation, blood in stool Genitourinary: minimal dysuria, neg urgency, frequency, hematuria and flank pain.  Musculoskeletal: myalgias back pain, Negative for   joint pain and falls.  Skin:  itching Negative  forand rash.  Neurological:weakness and headaches Negative for dizziness, tingling, tremors, sensory change, speech change, focal weakness, seizures, loss of consciousness, .  Endo/Heme/Allergies: Negative for environmental allergies and polydipsia. Does not bruise/bleed easily.  Psychiatric/Behavioral: Negative for depression, suicidal ideas, hallucinations, memory loss and substance abuse. The patient is not nervous/anxious and does not have insomnia.     Physical Exam  Blood pressure 105/58, pulse 105, temperature 99.7 F (37.6 C), temperature source Oral, resp. rate 18, last menstrual period 06/19/2013, SpO2 100.00%. GENERAL: Well-developed, well-nourished female in no acute distress.  LUNGS: Clear to  auscultation bilaterally.  HEART: mild tachycardia, Regular rhythm. ABDOMEN: minimal Right lower quadrant pain, neg rebound, nontender, nondistended, gravid.  EXTREMITIES: Nontender, no edema, 2+ distal pulses. DTR's 2+   FHT:  Baseline rate 150s bpm   Variability moderate  Accelerations present   Decelerations none Contractions: none   Labs: Results for orders placed during the hospital encounter of 03/06/14 (from the past 24 hour(s))  URINALYSIS, ROUTINE W REFLEX MICROSCOPIC   Collection Time    03/06/14  5:20 PM      Result Value Ref Range   Color, Urine YELLOW  YELLOW   APPearance CLEAR  CLEAR   Specific Gravity, Urine 1.010  1.005 - 1.030   pH 7.0  5.0 - 8.0   Glucose, UA NEGATIVE  NEGATIVE mg/dL   Hgb urine dipstick NEGATIVE  NEGATIVE   Bilirubin Urine NEGATIVE  NEGATIVE   Ketones, ur 40 (*) NEGATIVE mg/dL   Protein, ur NEGATIVE  NEGATIVE mg/dL   Urobilinogen, UA 0.2  0.0 - 1.0 mg/dL   Nitrite NEGATIVE  NEGATIVE   Leukocytes, UA SMALL (*) NEGATIVE  URINE MICROSCOPIC-ADD ON   Collection Time    03/06/14  5:20 PM      Result Value Ref Range   Squamous Epithelial / LPF FEW (*) RARE   WBC, UA 11-20  <3 WBC/hpf   RBC / HPF 0-2  <3 RBC/hpf   Bacteria, UA FEW (*) RARE   Urine-Other TRICHOMONAS PRESENT    CBC WITH DIFFERENTIAL   Collection Time    03/06/14  5:40 PM      Result Value Ref Range   WBC 9.4  4.0 - 10.5 K/uL   RBC 3.13 (*) 3.87 - 5.11 MIL/uL   Hemoglobin 8.4 (*) 12.0 - 15.0 g/dL   HCT 25.5 (*) 36.0 - 46.0 %   MCV 81.5  78.0 - 100.0 fL   MCH 26.8  26.0 - 34.0 pg   MCHC 32.9  30.0 - 36.0 g/dL   RDW 14.0  11.5 - 15.5 %   Platelets 178  150 - 400 K/uL   Neutrophils Relative % 86 (*) 43 - 77 %   Neutro Abs 8.1 (*) 1.7 - 7.7 K/uL   Lymphocytes Relative 8 (*) 12 - 46 %   Lymphs Abs 0.8  0.7 - 4.0 K/uL   Monocytes Relative 5  3 - 12 %   Monocytes Absolute 0.4  0.1 - 1.0 K/uL   Eosinophils Relative 1  0 - 5 %   Eosinophils Absolute 0.1  0.0 - 0.7 K/uL    Basophils Relative 0  0 - 1 %   Basophils Absolute 0.0  0.0 - 0.1 K/uL  COMPREHENSIVE METABOLIC PANEL   Collection Time    03/06/14  5:40 PM      Result Value Ref Range   Sodium 133 (*) 137 - 147 mEq/L   Potassium 3.6 (*) 3.7 - 5.3 mEq/L  Chloride 103  96 - 112 mEq/L   CO2 18 (*) 19 - 32 mEq/L   Glucose, Bld 107 (*) 70 - 99 mg/dL   BUN 5 (*) 6 - 23 mg/dL   Creatinine, Ser 0.44 (*) 0.50 - 1.10 mg/dL   Calcium 8.6  8.4 - 10.5 mg/dL   Total Protein 5.9 (*) 6.0 - 8.3 g/dL   Albumin 2.7 (*) 3.5 - 5.2 g/dL   AST 22  0 - 37 U/L   ALT 10  0 - 35 U/L   Alkaline Phosphatase 134 (*) 39 - 117 U/L   Total Bilirubin 0.3  0.3 - 1.2 mg/dL   GFR calc non Af Amer >90  >90 mL/min   GFR calc Af Amer >90  >90 mL/min  GLUCOSE, CAPILLARY   Collection Time    03/06/14  5:53 PM      Result Value Ref Range   Glucose-Capillary 83  70 - 99 mg/dL   Imaging Studies:  No results found.  Assessment: Krista Vaughn is  28 y.o. [id number] at [redacted]w[redacted]d presents with vasovagal like symptoms. Pt improved significantly with fluids. BP improved. Possibly related to [place].  #Vasovagal symptoms: Possibly related to [place]. Side effects include flu like illness, lethargy, back ache, lightheadedness. No obvious source for infection. Observed with improvement of symtoms  #Chest Pain: likely related to heart burn. Will start pepcid. Improved with GI Cocktail  #Cholestasis of pregnancy: Start actigal 300mg  BID. Message sent to clinic to coordinate testing.  #trich: Flagyl 500mg  BID x7d  #SOB: satting 100% on RA and lungs CTAB. No evidence of DVT. Likely dyspnea of pregnancy.  F/u in clinic this week for testing and reevaluation. Return to MAU for PTL or worsening symptoms. Pt handed over to night provider to monitor prior to discharge.   Fredrik Rigger 3/7/20157:36 PM  Feels much better, looks good Filed Vitals:   03/06/14 2050 03/06/14 2057 03/06/14 2113 03/06/14 2128  BP:  100/59 97/49 103/59  Pulse:   112 108 106  Temp: 99.1 F (37.3 C)     TempSrc: Oral     Resp: 18     SpO2:       Will discharge home.  Note given for tomorrow off and may sit at work per request Advised good sleeping habits

## 2014-03-06 NOTE — Discharge Instructions (Signed)
Cholestasis of Pregnancy Cholestasis refers to any condition where the flow of a digestive fluid (bile) in the gallbladder slows or stops. Cholestasis can develop during pregnancy because it is thought that pregnancy hormones affect how the gallbladder functions. It usually develops during the third trimester of pregnancy, but it can occur during any trimester. Often, it goes away soon after the baby is born. For the mother, it is usually uncomfortable but harmless. However, it can increase the risk for fetal distress, preterm birth, or fetal death. Early delivery may be recommended.  CAUSES  It is thought that pregnancy hormones may be the cause since the hormones may affect how the gallbladder functions.  SYMPTOMS   Intense itching, especially on the palms of the hands and soles of the feet.Itching can spread to the trunk of the body.  Rash.   Yellowing of the skin and eyes (jaundice).   Dark urine.   Light-colored stools.  DIAGNOSIS  Your health care provider will take your history and perform a physical exam. Blood tests may be performed to check your liver function, check your level of bile salts, and check for the chemical bilirubin. An ultrasonography (commonly called ultrasound) of your liver may be done to check for abnormalities.  TREATMENT  A medicine may be given to control the itching and help the liver to function normally because bile is produced by the liver. If the problem is severe and the jaundice reaches dangerous levels, labor may be induced or a cesarean delivery may be performed to prevent serious medical problems for the baby.  HOME CARE INSTRUCTIONS   Only take medicines or use anti-itch creams as directed by your health care provider.   Take a cool bath and add 2 3 tbs (30 45 g) of cornstarch to the water to soothe the itching or rash.   Keep all follow-up appointments as directed by your health care provider.   Keep your fingernails short to prevent skin  irritations caused from scratching.  SEEK MEDICAL CARE IF:  Your symptoms get worse even with treatment. Document Released: 12/14/2000 Document Revised: 08/19/2013 Document Reviewed: 04/06/2013 [facility] Patient Information 2014 [place], [place]. Trichomoniasis Trichomoniasis is an infection, caused by the Trichomonas organism, that affects both women and men. In women, the outer female genitalia and the vagina are affected. In men, the penis is mainly affected, but the prostate and other reproductive organs can also be involved. Trichomoniasis is a sexually transmitted disease (STD) and is most often passed to another person through sexual contact. The majority of people who get trichomoniasis do so from a sexual encounter and are also at risk for other STDs. CAUSES   Sexual intercourse with an infected partner.  It can be present in swimming pools or hot tubs. SYMPTOMS   Abnormal gray-green frothy vaginal discharge in women.  Vaginal itching and irritation in women.  Itching and irritation of the area outside the vagina in women.  Penile discharge with or without pain in males.  Inflammation of the urethra (urethritis), causing painful urination.  Bleeding after sexual intercourse. RELATED COMPLICATIONS  Pelvic inflammatory disease.  Infection of the uterus (endometritis).  Infertility.  Tubal (ectopic) pregnancy.  It can be associated with other STDs, including gonorrhea and chlamydia, hepatitis B, and HIV. COMPLICATIONS DURING PREGNANCY  Early (premature) delivery.  Premature rupture of the membranes (PROM).  Low birth weight. DIAGNOSIS   Visualization of Trichomonas under the microscope from the vagina discharge.  Ph of the vagina greater than 4.5, tested with  a test tape.  Trich Rapid Test.  Culture of the organism, but this is not usually needed.  It may be found on a Pap test.  Having a "strawberry cervix,"which means the cervix looks very red like a  strawberry. TREATMENT   You may be given medication to fight the infection. Inform your caregiver if you could be or are pregnant. Some medications used to treat the infection should not be taken during pregnancy.  Over-the-counter medications or creams to decrease itching or irritation may be recommended.  Your sexual partner will need to be treated if infected. HOME CARE INSTRUCTIONS   Take all medication prescribed by your caregiver.  Take over-the-counter medication for itching or irritation as directed by your caregiver.  Do not have sexual intercourse while you have the infection.  Do not douche or wear tampons.  Discuss your infection with your partner, as your partner may have acquired the infection from you. Or, your partner may have been the person who transmitted the infection to you.  Have your sex partner examined and treated if necessary.  Practice safe, informed, and protected sex.  See your caregiver for other STD testing. SEEK MEDICAL CARE IF:   You still have symptoms after you finish the medication.  You have an oral temperature above 102 F (38.9 C).  You develop belly (abdominal) pain.  You have pain when you urinate.  You have bleeding after sexual intercourse.  You develop a rash.  The medication makes you sick or makes you throw up (vomit). Document Released: 06/12/2001 Document Revised: 03/10/2012 Document Reviewed: 07/08/2009 [facility] Patient Information 2014 [place], [place].

## 2014-03-08 ENCOUNTER — Encounter (HOSPITAL_COMMUNITY): Payer: Self-pay | Admitting: *Deleted

## 2014-03-08 ENCOUNTER — Inpatient Hospital Stay (HOSPITAL_COMMUNITY)
Admission: AD | Admit: 2014-03-08 | Discharge: 2014-03-08 | Disposition: A | Discharge status: Home / Self Care | Payer: Medicaid Other | Source: Ambulatory Visit | Attending: Obstetrics & Gynecology | Admitting: Obstetrics & Gynecology

## 2014-03-08 ENCOUNTER — Ambulatory Visit (HOSPITAL_COMMUNITY)
Admission: RE | Admit: 2014-03-08 | Discharge: 2014-03-08 | Disposition: A | Discharge status: Home / Self Care | Payer: Medicaid Other | Source: Ambulatory Visit | Attending: Obstetrics and Gynecology | Admitting: Obstetrics and Gynecology

## 2014-03-08 ENCOUNTER — Encounter: Payer: Self-pay | Admitting: *Deleted

## 2014-03-08 DIAGNOSIS — O99342 Other mental disorders complicating pregnancy, second trimester: Secondary | ICD-10-CM

## 2014-03-08 DIAGNOSIS — K920 Hematemesis: Secondary | ICD-10-CM | POA: Insufficient documentation

## 2014-03-08 DIAGNOSIS — A5901 Trichomonal vulvovaginitis: Secondary | ICD-10-CM | POA: Insufficient documentation

## 2014-03-08 DIAGNOSIS — O99019 Anemia complicating pregnancy, unspecified trimester: Secondary | ICD-10-CM | POA: Insufficient documentation

## 2014-03-08 DIAGNOSIS — K219 Gastro-esophageal reflux disease without esophagitis: Secondary | ICD-10-CM | POA: Insufficient documentation

## 2014-03-08 DIAGNOSIS — O26613 Liver and biliary tract disorders in pregnancy, third trimester: Secondary | ICD-10-CM

## 2014-03-08 DIAGNOSIS — O98819 Other maternal infectious and parasitic diseases complicating pregnancy, unspecified trimester: Secondary | ICD-10-CM | POA: Insufficient documentation

## 2014-03-08 DIAGNOSIS — O358XX0 Maternal care for other (suspected) fetal abnormality and damage, not applicable or unspecified: Secondary | ICD-10-CM | POA: Insufficient documentation

## 2014-03-08 DIAGNOSIS — F419 Anxiety disorder, unspecified: Secondary | ICD-10-CM

## 2014-03-08 DIAGNOSIS — Z87891 Personal history of nicotine dependence: Secondary | ICD-10-CM | POA: Insufficient documentation

## 2014-03-08 DIAGNOSIS — Z1389 Encounter for screening for other disorder: Secondary | ICD-10-CM | POA: Insufficient documentation

## 2014-03-08 DIAGNOSIS — Z363 Encounter for antenatal screening for malformations: Secondary | ICD-10-CM | POA: Insufficient documentation

## 2014-03-08 DIAGNOSIS — O35EXX0 Maternal care for other (suspected) fetal abnormality and damage, fetal genitourinary anomalies, not applicable or unspecified: Secondary | ICD-10-CM

## 2014-03-08 DIAGNOSIS — D649 Anemia, unspecified: Secondary | ICD-10-CM | POA: Insufficient documentation

## 2014-03-08 DIAGNOSIS — O0932 Supervision of pregnancy with insufficient antenatal care, second trimester: Secondary | ICD-10-CM

## 2014-03-08 DIAGNOSIS — O9989 Other specified diseases and conditions complicating pregnancy, childbirth and the puerperium: Principal | ICD-10-CM

## 2014-03-08 DIAGNOSIS — O99891 Other specified diseases and conditions complicating pregnancy: Secondary | ICD-10-CM | POA: Insufficient documentation

## 2014-03-08 DIAGNOSIS — R12 Heartburn: Secondary | ICD-10-CM | POA: Insufficient documentation

## 2014-03-08 DIAGNOSIS — O09299 Supervision of pregnancy with other poor reproductive or obstetric history, unspecified trimester: Secondary | ICD-10-CM | POA: Insufficient documentation

## 2014-03-08 DIAGNOSIS — K831 Obstruction of bile duct: Secondary | ICD-10-CM

## 2014-03-08 DIAGNOSIS — O99330 Smoking (tobacco) complicating pregnancy, unspecified trimester: Secondary | ICD-10-CM | POA: Insufficient documentation

## 2014-03-08 DIAGNOSIS — O212 Late vomiting of pregnancy: Secondary | ICD-10-CM | POA: Insufficient documentation

## 2014-03-08 DIAGNOSIS — A599 Trichomoniasis, unspecified: Secondary | ICD-10-CM

## 2014-03-08 LAB — CBC
HCT: 25.5 % — ABNORMAL LOW (ref 36.0–46.0)
Hemoglobin: 8.3 g/dL — ABNORMAL LOW (ref 12.0–15.0)
MCH: 26.6 pg (ref 26.0–34.0)
MCHC: 32.5 g/dL (ref 30.0–36.0)
MCV: 81.7 fL (ref 78.0–100.0)
PLATELETS: 147 10*3/uL — AB (ref 150–400)
RBC: 3.12 MIL/uL — ABNORMAL LOW (ref 3.87–5.11)
RDW: 14.2 % (ref 11.5–15.5)
WBC: 8.6 10*3/uL (ref 4.0–10.5)

## 2014-03-08 LAB — COMPREHENSIVE METABOLIC PANEL
ALBUMIN: 2.8 g/dL — AB (ref 3.5–5.2)
ALK PHOS: 144 U/L — AB (ref 39–117)
ALT: 11 U/L (ref 0–35)
AST: 22 U/L (ref 0–37)
BILIRUBIN TOTAL: 0.2 mg/dL — AB (ref 0.3–1.2)
BUN: 3 mg/dL — ABNORMAL LOW (ref 6–23)
CHLORIDE: 103 meq/L (ref 96–112)
CO2: 21 mEq/L (ref 19–32)
Calcium: 8.4 mg/dL (ref 8.4–10.5)
Creatinine, Ser: 0.57 mg/dL (ref 0.50–1.10)
GFR calc Af Amer: >90 mL/min (ref >90)
GFR calc non Af Amer: >90 mL/min (ref >90)
Glucose, Bld: 98 mg/dL (ref 70–99)
POTASSIUM: 3.3 meq/L — AB (ref 3.7–5.3)
SODIUM: 137 meq/L (ref 137–147)
TOTAL PROTEIN: 6 g/dL (ref 6.0–8.3)

## 2014-03-08 LAB — URINE CULTURE

## 2014-03-08 MED ORDER — GI COCKTAIL ~~LOC~~
30.0000 mL | Freq: Once | ORAL | Status: AC
Start: 2014-03-08 — End: 2014-03-08
  Administered 2014-03-08: 30 mL via ORAL
  Filled 2014-03-08: qty 30

## 2014-03-08 MED ORDER — INTEGRA PLUS PO CAPS: 1.0000 | ORAL_CAPSULE | Freq: Every day | ORAL | Status: DC

## 2014-03-08 MED ORDER — POTASSIUM CHLORIDE CRYS ER 20 MEQ PO TBCR
40.0000 meq | EXTENDED_RELEASE_TABLET | Freq: Once | ORAL | Status: AC
  Administered 2014-03-08: 40 meq via ORAL
  Filled 2014-03-08: qty 2

## 2014-03-08 MED ORDER — FLUCONAZOLE 150 MG PO TABS: 150.0000 mg | ORAL_TABLET | Freq: Once | ORAL | Status: DC

## 2014-03-08 MED ORDER — METRONIDAZOLE 500 MG PO TABS
2000.0000 mg | ORAL_TABLET | Freq: Once | ORAL | Status: AC
  Administered 2014-03-08: 2000 mg via ORAL
  Filled 2014-03-08: qty 4

## 2014-03-08 NOTE — Discharge Instructions (Signed)
Heartburn During Pregnancy   Heartburn is a burning sensation in the chest caused by stomach acid backing up into the esophagus. Heartburn is common in pregnancy because a certain hormone (progesterone) is released when a woman is pregnant. The progesterone hormone may relax the valve that separates the esophagus from the stomach. This allows acid to go up into the esophagus, causing heartburn. Heartburn may also happen in pregnancy because the enlarging uterus pushes up on the stomach, which pushes more acid into the esophagus. This is especially true in the later stages of pregnancy. Heartburn problems usually go away after giving birth.  CAUSES   Heartburn is caused by stomach acid backing up into the esophagus. During pregnancy, this may result from various things, including:   · The progesterone hormone.  · Changing hormone levels.  · The growing uterus pushing stomach acid upward.  · Large meals.  · Certain foods and drinks.  · Exercise.  · Increased acid production.  SIGNS AND SYMPTOMS   · Burning pain in the chest or lower throat.  · Bitter taste in the mouth.  · Coughing.  DIAGNOSIS   Your health care provider will typically diagnose heartburn by taking a careful history of your concern. Blood tests may be done to check for a certain type of bacteria that is associated with heartburn. Sometimes, heartburn is diagnosed by prescribing a heartburn medicine to see if the symptoms improve. In some cases, a procedure called an endoscopy may be done. In this procedure, a tube with a light and a camera on the end (endoscope) is used to examine the esophagus and the stomach.  TREATMENT   Treatment will vary depending on the severity of your symptoms. Your health care provider may recommend:  · Over-the-counter medicines (antacids, acid reducers) for mild heartburn.  · Prescription medicines to decrease stomach acid or to protect your stomach lining.  · Certain changes in your diet.  · Elevating the head of your bed  by putting blocks under the legs. This helps prevent stomach acid from backing up into the esophagus when you are lying down.  HOME CARE INSTRUCTIONS   · Only take over-the-counter or prescription medicines as directed by your health care provider.  · Raise the head of your bed by putting blocks under the legs if instructed to do so by your health care provider. Sleeping with more pillows is not effective because it only changes the position of your head.  · Do not exercise right after eating.  · Avoid eating 2 3 hours before bed. Do not lie down right after eating.  · Eat small meals throughout the day instead of three large meals.  · Identify foods and beverages that make your symptoms worse and avoid them. Foods you may want to avoid include:  · Peppers.  · Chocolate.  · High-fat foods, including fried foods.  · Spicy foods.  · Garlic and onions.  · Citrus fruits, including oranges, grapefruit, lemons, and limes.  · Food containing tomatoes or tomato products.  · Mint.  · Carbonated and caffeinated drinks.  · Vinegar.  SEEK MEDICAL CARE IF:  · You have abdominal pain of any kind.  · You feel burning in your upper abdomen or chest, especially after eating or lying down.  · You have nausea and vomiting.  · Your stomach feels upset after you eat.  SEEK IMMEDIATE MEDICAL CARE IF:   · You have severe chest pain that goes down your arm or into your   jaw or neck.  · You feel sweaty, dizzy, or lightheaded.  · You become short of breath.  · You vomit blood.  · You have difficulty or pain with swallowing.  · You have bloody or black, tarry stools.  · You have episodes of heartburn more than 3 times a week, for more than 2 weeks.  MAKE SURE YOU:  · Understand these instructions.  · Will watch your condition.  · Will get help right away if you are not doing well or get worse.  Document Released: 12/14/2000 Document Revised: 10/07/2013 Document Reviewed: 08/05/2013  Exit[employer]® Patient Information ©2014 [employer].

## 2014-03-08 NOTE — Progress Notes (Signed)
MFM ultrasound  Indication: 28 yr old [id number] at [redacted]w[redacted]d with cholestasis and previous finding of pyelectasis for follow up ultrasound. Remote read.  Findings: 1. Single intrauterine pregnancy. 2. Estimated fetal weight is in the 75th%. 3. Posterior placenta without evidence of previa. 4. Normal amniotic fluid index. 5. The limited anatomy survey is normal.  Recommendations: 1. Appropriate fetal growth. 2. Pyelectasis has resolved. 3. Cholestasis; - recommend continue antenatal testing - recommend delivery at 32 weeks  [place], MD

## 2014-03-08 NOTE — MAU Provider Note (Signed)
Chief Complaint:  Hematemesis and Heartburn  Krista Vaughn is a 28 y.o.  [phone] with IUP at [redacted]w[redacted]d presenting for Hematemesis and Heartburn She reports being told she had a "viral GI bug" when she was here for n/v and given Rx which she was unable to fill due to financial difficulties. Her nausea continued and she vomited twice today with the second emesis containing "1/2 cup of blood." She has not vomited since, but continues to have burning epigastric pain, and request the GI cocktail she was given last time. She also hasn't filled her Flagyl Rx for Trich.  She denies in lightheadedness, but reports presyncopal feelings yesterday.   She denies any VB or LOF and notes good fetal movement.   Menstrual History: OB History   Grav Para Term Preterm Abortions TAB SAB Ect Mult Living   6 2 2  3 3  0   2      Patient's last menstrual period was 06/19/2013.     Past Medical History  Diagnosis Date  . Chlamydia   . Bronchitis   . Depression   . Anxiety   . Migraine     Past Surgical History  Procedure Laterality Date  . Induced abortion    . Therapeutic abortion      Family History  Problem Relation Age of Onset  . Down syndrome Sister     History  Substance Use Topics  . Smoking status: Former Smoker    Types: Cigarettes    Quit date: 01/08/2014  . Smokeless tobacco: Never Used  . Alcohol Use: No    Allergies  Allergen Reactions  . Ambien [Zolpidem] Other (See Comments)    Pt reports intolerance due to dizziness; prefers not to take.   . Decadron [Dexamethasone] Other (See Comments)    Makes her skin crawl    Prescriptions prior to admission  Medication Sig Dispense Refill  . diphenhydrAMINE (BENADRYL) 25 MG tablet Take 25 mg by mouth every 6 (six) hours as needed for allergies.      . famotidine (PEPCID) 40 MG tablet Take 1 tablet (40 mg total) by mouth daily.  40 tablet  1  . Prenatal Vit-Min-FA-Fish Oil ([employer] PRENATAL GUMMY PO) Take 1 each by mouth daily.       . ursodiol (ACTIGALL) 300 MG capsule Take 1 capsule (300 mg total) by mouth 2 (two) times daily.  90 capsule  1  . [DISCONTINUED] metroNIDAZOLE (FLAGYL) 500 MG tablet Take 1 tablet (500 mg total) by mouth 2 (two) times daily.  14 tablet  0  . [DISCONTINUED] zolpidem (AMBIEN) 5 MG tablet Take 1 tablet (5 mg total) by mouth at bedtime as needed for sleep.  15 tablet  0    Review of Systems - Negative except for what is mentioned in HPI.  Physical Exam  Blood pressure 99/48, pulse 99, resp. rate 16, last menstrual period 06/19/2013. GENERAL: Well-developed, well-nourished female in no acute distress.  LUNGS: Clear to auscultation bilaterally.  HEART: Regular rate and rhythm. ABDOMEN: Soft, mild epigastric tenderness to palpation, nondistended, gravid.  EXTREMITIES: Nontender, no edema, 2+ distal pulses. FHT:  Baseline rate 150 bpm   Variability moderate  Accelerations present. Decelerations none Contractions: None   Labs: Results for orders placed during the hospital encounter of 03/08/14 (from the past 24 hour(s))  CBC   Collection Time    03/08/14  2:35 AM      Result Value Ref Range   WBC 8.6  4.0 - 10.5 K/uL  RBC 3.12 (*) 3.87 - 5.11 MIL/uL   Hemoglobin 8.3 (*) 12.0 - 15.0 g/dL   HCT 25.5 (*) 36.0 - 46.0 %   MCV 81.7  78.0 - 100.0 fL   MCH 26.6  26.0 - 34.0 pg   MCHC 32.5  30.0 - 36.0 g/dL   RDW 14.2  11.5 - 15.5 %   Platelets 147 (*) 150 - 400 K/uL  COMPREHENSIVE METABOLIC PANEL   Collection Time    03/08/14  2:35 AM      Result Value Ref Range   Sodium 137  137 - 147 mEq/L   Potassium 3.3 (*) 3.7 - 5.3 mEq/L   Chloride 103  96 - 112 mEq/L   CO2 21  19 - 32 mEq/L   Glucose, Bld 98  70 - 99 mg/dL   BUN 3 (*) 6 - 23 mg/dL   Creatinine, Ser 0.57  0.50 - 1.10 mg/dL   Calcium 8.4  8.4 - 10.5 mg/dL   Total Protein 6.0  6.0 - 8.3 g/dL   Albumin 2.8 (*) 3.5 - 5.2 g/dL   AST 22  0 - 37 U/L   ALT 11  0 - 35 U/L   Alkaline Phosphatase 144 (*) 39 - 117 U/L   Total Bilirubin  0.2 (*) 0.3 - 1.2 mg/dL   GFR calc non Af Amer >90  >90 mL/min   GFR calc Af Amer >90  >90 mL/min    Imaging Studies:  No results found.  Assessment: Krista Vaughn is  28 y.o. [id number] at [redacted]w[redacted]d presents with Hematemesis and Heartburn Vomiting had resolved on admission. Tachycardia resolved with IVFs. BP stable.   Plan: GERD - Hgb 8.3 stable from 8.5 (01/26/14) - GI cocktail improved pain - Advised filling Rx for Pepcid that she received yesterday - Advised stopping Marijuana as this could be contributing to her nausea/vomiting  Trichomoniasis  - Untreated from previous visit due to not obtaining Rx - Given Flagyl 2g once; and advised having partner get treated  Anemia - Stable over the past month - Advised continuing prenatal vitamins - Start Integra - Continue evaluation as outpatient  Phill Myron 3/9/20153:24 AM

## 2014-03-08 NOTE — Telephone Encounter (Signed)
Called patient and heard message that her mailbox is full. Will send letter with results.

## 2014-03-08 NOTE — MAU Note (Signed)
Pt came in [facility] with c/o heartburn.  Says she was here a couple days ago and we gave her "something to drink that helped'. Reports she received a prescription but did not have money to get it filled till Monday.  Reports good fetal movement, denies vag bleeding, dysuria or lower abd pain.

## 2014-03-09 ENCOUNTER — Telehealth: Payer: Self-pay | Admitting: *Deleted

## 2014-03-09 NOTE — Telephone Encounter (Addendum)
Message copied by Langston Reusing on Tue Mar 09, 2014  8:31 AM ------      Message from: Allen Norris      Created: Sat Mar 06, 2014  7:08 PM       Choestasis of pregnancy      Please schedule pt for [facility] and NST/AFI, will need IOL @ 37wks            Fredrik Rigger, MD      [facility] Fellow       ------ Called pt and advised her that she now needs twice weekly appts for fetal testing due to new diagnosis of Cholestasis.  Pt had [place] for growth yesterday. She agreed to NST appt on 3/13 @ 0800. I stated that we will discuss further appts at the time of that appt. Pt also had questions about timing of delivery. I stated that it will be at [redacted] weeks EGA and we will discuss further at her appt.  Pt voiced understanding of all information and instructions.

## 2014-03-11 ENCOUNTER — Inpatient Hospital Stay (HOSPITAL_COMMUNITY)
Admission: AD | Admit: 2014-03-11 | Discharge: 2014-03-11 | Disposition: A | Discharge status: Home / Self Care | Payer: Medicaid Other | Source: Ambulatory Visit | Attending: Obstetrics & Gynecology | Admitting: Obstetrics & Gynecology

## 2014-03-11 ENCOUNTER — Telehealth: Payer: Self-pay | Admitting: General Practice

## 2014-03-11 ENCOUNTER — Encounter (HOSPITAL_COMMUNITY): Payer: Self-pay | Admitting: *Deleted

## 2014-03-11 ENCOUNTER — Inpatient Hospital Stay (HOSPITAL_COMMUNITY): Payer: Medicaid Other

## 2014-03-11 DIAGNOSIS — O26643 Intrahepatic cholestasis of pregnancy, third trimester: Secondary | ICD-10-CM

## 2014-03-11 DIAGNOSIS — K831 Obstruction of bile duct: Secondary | ICD-10-CM

## 2014-03-11 DIAGNOSIS — K219 Gastro-esophageal reflux disease without esophagitis: Secondary | ICD-10-CM | POA: Insufficient documentation

## 2014-03-11 DIAGNOSIS — F411 Generalized anxiety disorder: Secondary | ICD-10-CM | POA: Insufficient documentation

## 2014-03-11 DIAGNOSIS — F329 Major depressive disorder, single episode, unspecified: Secondary | ICD-10-CM | POA: Insufficient documentation

## 2014-03-11 DIAGNOSIS — K838 Other specified diseases of biliary tract: Secondary | ICD-10-CM | POA: Insufficient documentation

## 2014-03-11 DIAGNOSIS — O0932 Supervision of pregnancy with insufficient antenatal care, second trimester: Secondary | ICD-10-CM

## 2014-03-11 DIAGNOSIS — R109 Unspecified abdominal pain: Secondary | ICD-10-CM

## 2014-03-11 DIAGNOSIS — Z87891 Personal history of nicotine dependence: Secondary | ICD-10-CM | POA: Insufficient documentation

## 2014-03-11 DIAGNOSIS — O35EXX0 Maternal care for other (suspected) fetal abnormality and damage, fetal genitourinary anomalies, not applicable or unspecified: Secondary | ICD-10-CM

## 2014-03-11 DIAGNOSIS — A599 Trichomoniasis, unspecified: Secondary | ICD-10-CM

## 2014-03-11 DIAGNOSIS — O26613 Liver and biliary tract disorders in pregnancy, third trimester: Secondary | ICD-10-CM

## 2014-03-11 DIAGNOSIS — O26619 Liver and biliary tract disorders in pregnancy, unspecified trimester: Secondary | ICD-10-CM | POA: Insufficient documentation

## 2014-03-11 DIAGNOSIS — F3289 Other specified depressive episodes: Secondary | ICD-10-CM | POA: Insufficient documentation

## 2014-03-11 DIAGNOSIS — O358XX0 Maternal care for other (suspected) fetal abnormality and damage, not applicable or unspecified: Secondary | ICD-10-CM

## 2014-03-11 HISTORY — DX: Cholecystitis, unspecified: K81.9

## 2014-03-11 LAB — URINE MICROSCOPIC-ADD ON

## 2014-03-11 LAB — FETAL FIBRONECTIN: Fetal Fibronectin: NEGATIVE

## 2014-03-11 LAB — COMPREHENSIVE METABOLIC PANEL
ALBUMIN: 2.8 g/dL — AB (ref 3.5–5.2)
ALK PHOS: 165 U/L — AB (ref 39–117)
ALT: 11 U/L (ref 0–35)
AST: 21 U/L (ref 0–37)
BUN: 3 mg/dL — ABNORMAL LOW (ref 6–23)
CO2: 21 mEq/L (ref 19–32)
Calcium: 8.3 mg/dL — ABNORMAL LOW (ref 8.4–10.5)
Chloride: 103 mEq/L (ref 96–112)
Creatinine, Ser: 0.62 mg/dL (ref 0.50–1.10)
GFR calc Af Amer: >90 mL/min (ref >90)
GFR calc non Af Amer: >90 mL/min (ref >90)
GLUCOSE: 86 mg/dL (ref 70–99)
POTASSIUM: 3.8 meq/L (ref 3.7–5.3)
SODIUM: 138 meq/L (ref 137–147)
TOTAL PROTEIN: 6.1 g/dL (ref 6.0–8.3)
Total Bilirubin: 0.3 mg/dL (ref 0.3–1.2)

## 2014-03-11 LAB — CBC
HCT: 27.2 % — ABNORMAL LOW (ref 36.0–46.0)
Hemoglobin: 8.7 g/dL — ABNORMAL LOW (ref 12.0–15.0)
MCH: 26.4 pg (ref 26.0–34.0)
MCHC: 32 g/dL (ref 30.0–36.0)
MCV: 82.7 fL (ref 78.0–100.0)
PLATELETS: 161 10*3/uL (ref 150–400)
RBC: 3.29 MIL/uL — ABNORMAL LOW (ref 3.87–5.11)
RDW: 14.5 % (ref 11.5–15.5)
WBC: 8.5 10*3/uL (ref 4.0–10.5)

## 2014-03-11 LAB — URINALYSIS, ROUTINE W REFLEX MICROSCOPIC
Bilirubin Urine: NEGATIVE
GLUCOSE, UA: NEGATIVE mg/dL
KETONES UR: 15 mg/dL — AB
Nitrite: NEGATIVE
Protein, ur: NEGATIVE mg/dL
Specific Gravity, Urine: 1.015 (ref 1.005–1.030)
Urobilinogen, UA: 0.2 mg/dL (ref 0.0–1.0)
pH: 6.5 (ref 5.0–8.0)

## 2014-03-11 LAB — WET PREP, GENITAL
CLUE CELLS WET PREP: NONE SEEN
TRICH WET PREP: NONE SEEN
Yeast Wet Prep HPF POC: NONE SEEN

## 2014-03-11 LAB — LIPASE, BLOOD: Lipase: 52 U/L (ref 11–59)

## 2014-03-11 MED ORDER — HYDROMORPHONE HCL PF 1 MG/ML IJ SOLN
1.0000 mg | Freq: Once | INTRAMUSCULAR | Status: AC
  Administered 2014-03-11: 1 mg via INTRAMUSCULAR
  Filled 2014-03-11: qty 1

## 2014-03-11 MED ORDER — OXYCODONE-ACETAMINOPHEN 5-325 MG PO TABS: 2.0000 | ORAL_TABLET | ORAL | Status: DC | PRN

## 2014-03-11 NOTE — MAU Provider Note (Signed)
Attestation of Attending Supervision of Obstetric Fellow: Evaluation and management procedures were performed by the Obstetric Fellow under my supervision and collaboration.  I have reviewed the Obstetric Fellow's note and chart, and I agree with the management and plan.  Gisel Vipond, MD, FACOG Attending Obstetrician & Gynecologist Faculty Practice, [facility]   

## 2014-03-11 NOTE — Telephone Encounter (Signed)
Patient called in to front office very emotional and crying stating that she was in severe pain in her abdomen and she doesn't want to go back to [facility]U because all they tell her is this is heartburn and this isn't heartburn because she is in excruciating pain and she woke up this morning spotting. Encouraged patient to go to MAU since her pain is so severe so they can evaluate her to see what is going on and to make sure she isn't in labor. Patient stated hesitancy going because what are they going to do besides send her back home. Discussed with patient that MAU is better equipped to manage her pain especially if it is severe and that if something were going on where she needed to be admitted to the hospital MAU could easily have her admitted and they would be better able to manage her if something was wrong. Patient verbalized understanding and calmed down a little bit and stated she would go. Patient had no further questions

## 2014-03-11 NOTE — MAU Provider Note (Signed)
I examined pt and agree with documentation above and resident plan of care. MUHAMMAD,WALIDAH  

## 2014-03-11 NOTE — MAU Note (Signed)
C/o progressive epigastric painfor past week; itching all over her body;

## 2014-03-11 NOTE — MAU Note (Signed)
Patient states she has been having constant pain on the fundus for a while. States she has been told it is heartburn but has no response to medication. Has had light spotting with wiping. Reports fetal movement. States she has had a fever off and on up to 100.5 last night.

## 2014-03-11 NOTE — Discharge Instructions (Signed)
Abdominal Pain During Pregnancy °Abdominal pain is common in pregnancy. Most of the time, it does not cause harm. There are many causes of abdominal pain. Some causes are more serious than others. Some of the causes of abdominal pain in pregnancy are easily diagnosed. Occasionally, the diagnosis takes time to understand. Other times, the cause is not determined. Abdominal pain can be a sign that something is very wrong with the pregnancy, or the pain may have nothing to do with the pregnancy at all. For this reason, always tell your health care provider if you have any abdominal discomfort. °HOME CARE INSTRUCTIONS  °Monitor your abdominal pain for any changes. The following actions may help to alleviate any discomfort you are experiencing: °· Do not have sexual intercourse or put anything in your vagina until your symptoms go away completely. °· Get plenty of rest until your pain improves. °· Drink clear fluids if you feel nauseous. Avoid solid food as long as you are uncomfortable or nauseous. °· Only take over-the-counter or prescription medicine as directed by your health care provider. °· Keep all follow-up appointments with your health care provider. °SEEK IMMEDIATE MEDICAL CARE IF: °· You are bleeding, leaking fluid, or passing tissue from the vagina. °· You have increasing pain or cramping. °· You have persistent vomiting. °· You have painful or bloody urination. °· You have a fever. °· You notice a decrease in your baby's movements. °· You have extreme weakness or feel faint. °· You have shortness of breath, with or without abdominal pain. °· You develop a severe headache with abdominal pain. °· You have abnormal vaginal discharge with abdominal pain. °· You have persistent diarrhea. °· You have abdominal pain that continues even after rest, or gets worse. °MAKE SURE YOU:  °· Understand these instructions. °· Will watch your condition. °· Will get help right away if you are not doing well or get  worse. °Document Released: 12/17/2005 Document Revised: 10/07/2013 Document Reviewed: 07/16/2013 °Exit[employer]® Patient Information ©2014 [employer]. ° °

## 2014-03-11 NOTE — MAU Provider Note (Signed)
History    CSN: [id number]  Arrival date and time: 03/11/14 0851   None     Chief Complaint  Patient presents with  . Abdominal Pain  . Fever   HPI  Pt is a 28 yo [phone] currently pregnant female with a PMH of Trichomoniasis infection, abortions, depression, cholestasis and GERD presenting with one week of upper abdominal cramping and body aches and one day of vaginal bleeding. Pt was seen last week twice in the MAU this past week for back pain, myalgias, chest pain, and nausea. Pt is no longer experiencing any chest pain or nausea, but continues to experience generalized myalgias and upper abdominal pain. She describes both symptoms as constant in nature and unrelieved by anything. Eating makes the abdominal pain worse. Pt reports an objective fever of 101 last night, and took tylenol for this in the AM. Pt states that she has been taking the ranitidine as prescribed. Pt was recently started on actigal in the past week for cholestasis. She has had no shortness of breath, diarrhea, headache, or nasal congestion.  Pt noticed streaks of blood on TP this AM, and states that she had one small clot in the toilet. She has felt no decrease in the baby's activity and no increase in contractions. (She has occasional braxton-hicks contractions) Pt had complaints of vaginal discharge at a routine prenatal check on 3/4 and was prescribed fluconazole and flagyl after a wet prep detected yeast and a UA was positive for trich. Pt just started taking the flagyl yesterday.   Past Medical History  Diagnosis Date  . Chlamydia   . Bronchitis   . Depression   . Anxiety   . Migraine     Past Surgical History  Procedure Laterality Date  . Induced abortion    . Therapeutic abortion      Family History  Problem Relation Age of Onset  . Down syndrome Sister     History  Substance Use Topics  . Smoking status: Former Smoker    Types: Cigarettes    Quit date: 01/08/2014  . Smokeless tobacco:  Never Used  . Alcohol Use: No    Allergies:  Allergies  Allergen Reactions  . Ambien [Zolpidem] Other (See Comments)    Pt reports intolerance due to dizziness; prefers not to take.   . Decadron [Dexamethasone] Other (See Comments)    Makes her skin crawl    Prescriptions prior to admission  Medication Sig Dispense Refill  . diphenhydrAMINE (BENADRYL) 25 MG tablet Take 25 mg by mouth every 6 (six) hours as needed for allergies.      . famotidine (PEPCID) 40 MG tablet Take 1 tablet (40 mg total) by mouth daily.  40 tablet  1  . FeFum-FePoly-FA-B Cmp-C-Biot (INTEGRA PLUS) CAPS Take 1 capsule by mouth daily.  30 capsule  3  . fluconazole (DIFLUCAN) 150 MG tablet Take 1 tablet (150 mg total) by mouth once.  2 tablet  0  . Prenatal Vit-Min-FA-Fish Oil ([employer] PRENATAL GUMMY PO) Take 1 each by mouth daily.      . ursodiol (ACTIGALL) 300 MG capsule Take 1 capsule (300 mg total) by mouth 2 (two) times daily.  90 capsule  1    Review of Systems  Constitutional: Positive for fever (last night) and malaise/fatigue. Negative for chills.  Respiratory: Negative for cough and shortness of breath.   Cardiovascular: Negative for chest pain.  Gastrointestinal: Positive for abdominal pain. Negative for nausea, vomiting, diarrhea and constipation.  Genitourinary: Negative  for dysuria, frequency and hematuria.  Musculoskeletal: Positive for back pain and myalgias.  Neurological: Positive for weakness. Negative for dizziness and headaches.   Physical Exam   Blood pressure 117/71, pulse 106, temperature 98.9 F (37.2 C), temperature source Oral, resp. rate 20, last menstrual period 06/19/2013, SpO2 100.00%.  Physical Exam  Constitutional: Vital signs are normal. She is cooperative. She appears distressed (uncomfortable).  Cardiovascular: Normal rate and regular rhythm.   GI: There is tenderness in the right upper quadrant and left upper quadrant. There is no rebound and no guarding.  Neurological: She  is alert.  No CVAT SSE: copious discharge white, no blood No c/c/e  Dilation: 1 Effacement (%): 40 Station: -3 Exam by:: L Barefoot NP  FHT:  150s mod var mult accels >15x15 no decels Toco: No ctx on monitor  Results for orders placed during the hospital encounter of 03/11/14 (from the past 72 hour(s))  URINALYSIS, ROUTINE W REFLEX MICROSCOPIC     Status: Abnormal   Collection Time    03/11/14  9:20 AM      Result Value Ref Range   Color, Urine YELLOW  YELLOW   APPearance CLEAR  CLEAR   Specific Gravity, Urine 1.015  1.005 - 1.030   pH 6.5  5.0 - 8.0   Glucose, UA NEGATIVE  NEGATIVE mg/dL   Hgb urine dipstick TRACE (*) NEGATIVE   Bilirubin Urine NEGATIVE  NEGATIVE   Ketones, ur 15 (*) NEGATIVE mg/dL   Protein, ur NEGATIVE  NEGATIVE mg/dL   Urobilinogen, UA 0.2  0.0 - 1.0 mg/dL   Nitrite NEGATIVE  NEGATIVE   Leukocytes, UA SMALL (*) NEGATIVE  URINE MICROSCOPIC-ADD ON     Status: None   Collection Time    03/11/14  9:20 AM      Result Value Ref Range   Squamous Epithelial / LPF RARE  RARE   WBC, UA 3-6  <3 WBC/hpf   Bacteria, UA RARE  RARE  CBC     Status: Abnormal   Collection Time    03/11/14 10:39 AM      Result Value Ref Range   WBC 8.5  4.0 - 10.5 K/uL   RBC 3.29 (*) 3.87 - 5.11 MIL/uL   Hemoglobin 8.7 (*) 12.0 - 15.0 g/dL   HCT 27.2 (*) 36.0 - 46.0 %   MCV 82.7  78.0 - 100.0 fL   MCH 26.4  26.0 - 34.0 pg   MCHC 32.0  30.0 - 36.0 g/dL   RDW 14.5  11.5 - 15.5 %   Platelets 161  150 - 400 K/uL  COMPREHENSIVE METABOLIC PANEL     Status: Abnormal   Collection Time    03/11/14 10:39 AM      Result Value Ref Range   Sodium 138  137 - 147 mEq/L   Potassium 3.8  3.7 - 5.3 mEq/L   Chloride 103  96 - 112 mEq/L   CO2 21  19 - 32 mEq/L   Glucose, Bld 86  70 - 99 mg/dL   BUN 3 (*) 6 - 23 mg/dL   Creatinine, Ser 0.62  0.50 - 1.10 mg/dL   Calcium 8.3 (*) 8.4 - 10.5 mg/dL   Total Protein 6.1  6.0 - 8.3 g/dL   Albumin 2.8 (*) 3.5 - 5.2 g/dL   AST 21  0 - 37 U/L    ALT 11  0 - 35 U/L   Alkaline Phosphatase 165 (*) 39 - 117 U/L   Total Bilirubin  0.3  0.3 - 1.2 mg/dL   GFR calc non Af Amer >90  >90 mL/min   GFR calc Af Amer >90  >90 mL/min   Comment: (NOTE)     The eGFR has been calculated using the CKD EPI equation.     This calculation has not been validated in all clinical situations.     eGFR's persistently <90 mL/min signify possible Chronic Kidney     Disease.  LIPASE, BLOOD     Status: None   Collection Time    03/11/14 10:39 AM      Result Value Ref Range   Lipase 52  11 - 59 U/L   Comment: Performed at [facility], [facility]     Status: Abnormal   Collection Time    03/11/14 11:55 AM      Result Value Ref Range   Yeast Wet Prep HPF POC NONE SEEN  NONE SEEN   Trich, Wet Prep NONE SEEN  NONE SEEN   Clue Cells Wet Prep HPF POC NONE SEEN  NONE SEEN   WBC, Wet Prep HPF POC MANY (*) NONE SEEN   Comment: MANY BACTERIA SEEN  FETAL FIBRONECTIN     Status: None   Collection Time    03/11/14 11:55 AM      Result Value Ref Range   Fetal Fibronectin NEGATIVE  NEGATIVE   CLINICAL DATA: Upper abdominal pain  EXAM:  US ABDOMEN LIMITED - RIGHT UPPER QUADRANT  COMPARISON: None.  FINDINGS:  Gallbladder:  Within the gallbladder, there are several small echogenic foci which  shadow but do not show appreciable movement. There is no gallbladder  wall thickening or pericholecystic fluid. No sonographic Murphy sign  noted.  Common bile duct:  Diameter: 5 mm. There is no intrahepatic or extrahepatic biliary  duct dilatation.  Liver:  No focal lesion identified. Within normal limits in parenchymal  echogenicity.  IMPRESSION:  Cholelithiasis. Study otherwise unremarkable.  Electronically Signed  By: Lowella Grip M.D.  On: 03/11/2014 11:38   MAU Course  Procedures  MDM Evaluated for gall stones  Assessment and Plan  DONIA YOKUM is a 28 y.o. [id number] at [redacted]w[redacted]d with cholestasis of pregnancy and abdominal pain over  last week. Possibly related to passing of gall stone vs mild viral illness vs side effect of actigal. No evidence of severe infection. Stable white count, no cholidocolithiasis. Will tx pain with percocet and follow up in clinic tomorrow for NST. Return for PTL precautions or worsening symptoms  Continue GERD medication  Reactive and reassuring fetus.  Jeanett Schlein T 03/11/2014, 9:52 AM

## 2014-03-12 ENCOUNTER — Observation Stay (HOSPITAL_COMMUNITY)
Admission: AD | Admit: 2014-03-12 | Discharge: 2014-03-14 | Disposition: A | Discharge status: Home / Self Care | Payer: Medicaid Other | Source: Ambulatory Visit | Attending: Obstetrics & Gynecology | Admitting: Obstetrics & Gynecology

## 2014-03-12 ENCOUNTER — Other Ambulatory Visit: Payer: Medicaid Other

## 2014-03-12 ENCOUNTER — Telehealth: Payer: Self-pay | Admitting: General Practice

## 2014-03-12 ENCOUNTER — Encounter (HOSPITAL_COMMUNITY): Payer: Self-pay | Admitting: *Deleted

## 2014-03-12 DIAGNOSIS — O26899 Other specified pregnancy related conditions, unspecified trimester: Secondary | ICD-10-CM

## 2014-03-12 DIAGNOSIS — O212 Late vomiting of pregnancy: Secondary | ICD-10-CM | POA: Insufficient documentation

## 2014-03-12 DIAGNOSIS — O4700 False labor before 37 completed weeks of gestation, unspecified trimester: Secondary | ICD-10-CM | POA: Insufficient documentation

## 2014-03-12 DIAGNOSIS — K209 Esophagitis, unspecified without bleeding: Secondary | ICD-10-CM

## 2014-03-12 DIAGNOSIS — O99891 Other specified diseases and conditions complicating pregnancy: Principal | ICD-10-CM | POA: Insufficient documentation

## 2014-03-12 DIAGNOSIS — F329 Major depressive disorder, single episode, unspecified: Secondary | ICD-10-CM

## 2014-03-12 DIAGNOSIS — K831 Obstruction of bile duct: Secondary | ICD-10-CM

## 2014-03-12 DIAGNOSIS — A599 Trichomoniasis, unspecified: Secondary | ICD-10-CM

## 2014-03-12 DIAGNOSIS — O0932 Supervision of pregnancy with insufficient antenatal care, second trimester: Secondary | ICD-10-CM

## 2014-03-12 DIAGNOSIS — O99342 Other mental disorders complicating pregnancy, second trimester: Secondary | ICD-10-CM

## 2014-03-12 DIAGNOSIS — O26619 Liver and biliary tract disorders in pregnancy, unspecified trimester: Secondary | ICD-10-CM | POA: Insufficient documentation

## 2014-03-12 DIAGNOSIS — O26839 Pregnancy related renal disease, unspecified trimester: Secondary | ICD-10-CM | POA: Insufficient documentation

## 2014-03-12 DIAGNOSIS — N1330 Unspecified hydronephrosis: Secondary | ICD-10-CM | POA: Insufficient documentation

## 2014-03-12 DIAGNOSIS — O9989 Other specified diseases and conditions complicating pregnancy, childbirth and the puerperium: Principal | ICD-10-CM

## 2014-03-12 DIAGNOSIS — K838 Other specified diseases of biliary tract: Secondary | ICD-10-CM | POA: Insufficient documentation

## 2014-03-12 DIAGNOSIS — O26613 Liver and biliary tract disorders in pregnancy, third trimester: Secondary | ICD-10-CM

## 2014-03-12 DIAGNOSIS — Z87891 Personal history of nicotine dependence: Secondary | ICD-10-CM | POA: Insufficient documentation

## 2014-03-12 DIAGNOSIS — R109 Unspecified abdominal pain: Secondary | ICD-10-CM | POA: Insufficient documentation

## 2014-03-12 DIAGNOSIS — R1310 Dysphagia, unspecified: Secondary | ICD-10-CM | POA: Insufficient documentation

## 2014-03-12 DIAGNOSIS — F419 Anxiety disorder, unspecified: Secondary | ICD-10-CM

## 2014-03-12 NOTE — Telephone Encounter (Signed)
Patient called in to front office very emotional and sobbing, stating she went to MAU yesterday and was given percocet and it isn't really helping at all with her pain and she can't keep going like this till she is 37 weeks and we take her baby. She cant eat or drink anything except water and is in such severe pain, patient states if she has to continue with this much pain she is going to lose her mind. Patient continued to be very upset and hysterical on the phone. Discussed with patient that we are trying to help her and keep her and her baby safe and that since her pain is so severe she needs to go to MAU because she may need further testing and scans since her pain is unrelieved by percocet and other measures. Patient verbalized understanding and had no further questions. Patient had calmed down before we ended phone call

## 2014-03-12 NOTE — MAU Note (Signed)
Pt reports she has had this abd pain for overa a week. Was here on 3/9 dx with gallstones. Pain is worse. Unable to take pain medication keeps throwing it up. Unable to eat.

## 2014-03-13 ENCOUNTER — Inpatient Hospital Stay (HOSPITAL_COMMUNITY): Payer: Medicaid Other

## 2014-03-13 DIAGNOSIS — O26899 Other specified pregnancy related conditions, unspecified trimester: Secondary | ICD-10-CM | POA: Diagnosis present

## 2014-03-13 DIAGNOSIS — K838 Other specified diseases of biliary tract: Secondary | ICD-10-CM

## 2014-03-13 DIAGNOSIS — O9989 Other specified diseases and conditions complicating pregnancy, childbirth and the puerperium: Secondary | ICD-10-CM

## 2014-03-13 DIAGNOSIS — O26619 Liver and biliary tract disorders in pregnancy, unspecified trimester: Secondary | ICD-10-CM

## 2014-03-13 DIAGNOSIS — R109 Unspecified abdominal pain: Secondary | ICD-10-CM

## 2014-03-13 LAB — COMPREHENSIVE METABOLIC PANEL
ALBUMIN: 2.7 g/dL — AB (ref 3.5–5.2)
ALT: 15 U/L (ref 0–35)
AST: 34 U/L (ref 0–37)
Alkaline Phosphatase: 171 U/L — ABNORMAL HIGH (ref 39–117)
BUN: 4 mg/dL — AB (ref 6–23)
CO2: 21 mEq/L (ref 19–32)
Calcium: 8.3 mg/dL — ABNORMAL LOW (ref 8.4–10.5)
Chloride: 101 mEq/L (ref 96–112)
Creatinine, Ser: 0.52 mg/dL (ref 0.50–1.10)
GFR calc non Af Amer: >90 mL/min (ref >90)
Glucose, Bld: 74 mg/dL (ref 70–99)
POTASSIUM: 4.2 meq/L (ref 3.7–5.3)
SODIUM: 135 meq/L — AB (ref 137–147)
TOTAL PROTEIN: 6.2 g/dL (ref 6.0–8.3)
Total Bilirubin: 0.3 mg/dL (ref 0.3–1.2)

## 2014-03-13 LAB — CBC WITH DIFFERENTIAL/PLATELET
Basophils Absolute: 0 10*3/uL (ref 0.0–0.1)
Basophils Relative: 0 % (ref 0–1)
Eosinophils Absolute: 0 10*3/uL (ref 0.0–0.7)
Eosinophils Relative: 0 % (ref 0–5)
HCT: 29.3 % — ABNORMAL LOW (ref 36.0–46.0)
HEMOGLOBIN: 9.4 g/dL — AB (ref 12.0–15.0)
LYMPHS ABS: 1.4 10*3/uL (ref 0.7–4.0)
Lymphocytes Relative: 16 % (ref 12–46)
MCH: 26.6 pg (ref 26.0–34.0)
MCHC: 32.1 g/dL (ref 30.0–36.0)
MCV: 82.8 fL (ref 78.0–100.0)
MONO ABS: 0.5 10*3/uL (ref 0.1–1.0)
MONOS PCT: 6 % (ref 3–12)
NEUTROS PCT: 78 % — AB (ref 43–77)
Neutro Abs: 6.7 10*3/uL (ref 1.7–7.7)
Platelets: 182 10*3/uL (ref 150–400)
RBC: 3.54 MIL/uL — AB (ref 3.87–5.11)
RDW: 14.5 % (ref 11.5–15.5)
WBC: 8.6 10*3/uL (ref 4.0–10.5)

## 2014-03-13 LAB — URINALYSIS, ROUTINE W REFLEX MICROSCOPIC
Bilirubin Urine: NEGATIVE
Glucose, UA: NEGATIVE mg/dL
Hgb urine dipstick: NEGATIVE
Ketones, ur: 40 mg/dL — AB
Leukocytes, UA: NEGATIVE
Nitrite: NEGATIVE
PH: 6.5 (ref 5.0–8.0)
Protein, ur: NEGATIVE mg/dL
Specific Gravity, Urine: 1.015 (ref 1.005–1.030)
Urobilinogen, UA: 0.2 mg/dL (ref 0.0–1.0)

## 2014-03-13 LAB — RAPID URINE DRUG SCREEN, HOSP PERFORMED
Amphetamines: NOT DETECTED
Barbiturates: POSITIVE — AB
Benzodiazepines: NOT DETECTED
COCAINE: NOT DETECTED
OPIATES: NOT DETECTED
TETRAHYDROCANNABINOL: POSITIVE — AB

## 2014-03-13 LAB — LIPASE, BLOOD: LIPASE: 40 U/L (ref 11–59)

## 2014-03-13 MED ORDER — PROMETHAZINE HCL 25 MG/ML IJ SOLN
12.5000 mg | Freq: Once | INTRAMUSCULAR | Status: AC
  Administered 2014-03-13: 12.5 mg via INTRAVENOUS
  Filled 2014-03-13: qty 1

## 2014-03-13 MED ORDER — PRENATAL MULTIVITAMIN CH
1.0000 | ORAL_TABLET | Freq: Every day | ORAL | Status: DC
  Administered 2014-03-13 – 2014-03-14 (×2): 1 via ORAL
  Filled 2014-03-13 (×3): qty 1

## 2014-03-13 MED ORDER — ACETAMINOPHEN 325 MG PO TABS
650.0000 mg | ORAL_TABLET | ORAL | Status: DC | PRN
  Administered 2014-03-13 (×2): 650 mg via ORAL
  Filled 2014-03-13 (×2): qty 2

## 2014-03-13 MED ORDER — LACTATED RINGERS IV BOLUS (SEPSIS)
1000.0000 mL | Freq: Once | INTRAVENOUS | Status: AC
  Administered 2014-03-13: 1000 mL via INTRAVENOUS

## 2014-03-13 MED ORDER — MORPHINE SULFATE 4 MG/ML IJ SOLN
6.0000 mg | INTRAMUSCULAR | Status: AC
  Administered 2014-03-13: 6 mg via INTRAVENOUS
  Filled 2014-03-13: qty 2

## 2014-03-13 MED ORDER — DOCUSATE SODIUM 100 MG PO CAPS
100.0000 mg | ORAL_CAPSULE | Freq: Every day | ORAL | Status: DC
  Administered 2014-03-13 – 2014-03-14 (×2): 100 mg via ORAL
  Filled 2014-03-13 (×3): qty 1

## 2014-03-13 MED ORDER — CALCIUM CARBONATE ANTACID 500 MG PO CHEW
2.0000 | CHEWABLE_TABLET | ORAL | Status: DC | PRN
  Administered 2014-03-13 – 2014-03-14 (×4): 400 mg via ORAL
  Filled 2014-03-13 (×5): qty 1
  Filled 2014-03-13: qty 2
  Filled 2014-03-13: qty 1

## 2014-03-13 MED ORDER — MORPHINE SULFATE 4 MG/ML IJ SOLN
6.0000 mg | INTRAMUSCULAR | Status: DC | PRN
  Administered 2014-03-13 – 2014-03-14 (×5): 6 mg via INTRAVENOUS
  Filled 2014-03-13 (×8): qty 2

## 2014-03-13 MED ORDER — LACTATED RINGERS IV SOLN
INTRAVENOUS | Status: DC
  Administered 2014-03-13 – 2014-03-14 (×3): via INTRAVENOUS

## 2014-03-13 MED ORDER — PANTOPRAZOLE SODIUM 40 MG IV SOLR
40.0000 mg | Freq: Two times a day (BID) | INTRAVENOUS | Status: DC
  Administered 2014-03-13 – 2014-03-14 (×3): 40 mg via INTRAVENOUS
  Filled 2014-03-13 (×6): qty 40

## 2014-03-13 MED ORDER — MORPHINE SULFATE 4 MG/ML IJ SOLN
4.0000 mg | Freq: Once | INTRAMUSCULAR | Status: AC
  Administered 2014-03-13: 4 mg via INTRAVENOUS
  Filled 2014-03-13: qty 1

## 2014-03-13 NOTE — H&P (Signed)
[facility]ACULTY PRACTICE ANTEPARTUM ADMISSION HISTORY AND PHYSICAL NOTE   History of Present Illness: Krista Vaughn is a 28 y.o. [id number] at [redacted]w[redacted]d admitted for abdominal pain.    Pt has had one week of upper abdominal pain that has been constant. States it is is in the upper abdomen and radiates to the left side some. Constant pain but ebs and flows some in severity. Made worse by laying in certain positions. She has been seen in the MAU now 4 times this week for similar pain and workup thus far has included labs (normal CMP, lipase, CBC, gc/chl) and an abdominal RUQ US showing gallstones without evidence of inflammation or obstruction. She was diagnosed with trich on 03/06/14 and treated with flagyl.  Today, having nausea and vomiting also and says she has only been able to keep water down all day.  Food does not make her pain worse.  This is new but the pain has remained unchanged.    No fevers, chills, HA, SOB, CP, cough, diarrhea, constipation, LEE, lower extremity weakness.   PNC at [facility] starting at 17 weeks. Neg hemoglobin electrophoresis.  Normal labs.        Patient Active Problem List   Diagnosis Date Noted  . Cholestasis of pregnancy in third trimester 03/06/2014  . Trichomonal infection 03/06/2014  . Yeast infection of the vagina 03/04/2014  . Anxiety in pregnancy in second trimester, antepartum 12/10/2013  . Depression complicating pregnancy in second trimester, antepartum 12/10/2013  . Ultrasound recheck of fetal pyelectasis, antepartum 12/07/2013  . Low grade squamous intraepithelial lesion (LGSIL) on Pap smear 11/29/2013  . Marijuana use 11/22/2013  . Late prenatal care complicating pregnancy in second trimester 11/18/2013     Past Medical History  Diagnosis Date  . Chlamydia   . Bronchitis   . Depression   . Anxiety   . Migraine   . Cholecystitis      Past Surgical History  Procedure Laterality Date  . Induced abortion    . Therapeutic abortion       OB  History   Grav Para Term Preterm Abortions TAB SAB Ect Mult Living   6 2 2  3 3  0   2      History   Social History  . Marital Status: Single    Spouse Name: N/A    Number of Children: N/A  . Years of Education: N/A   Social History Main Topics  . Smoking status: Former Smoker    Types: Cigarettes    Quit date: 01/08/2014  . Smokeless tobacco: Never Used  . Alcohol Use: No  . Drug Use: No  . Sexual Activity: Yes    Birth Control/ Protection: None   Other Topics Concern  . None   Social History Narrative  . None    Family History  Problem Relation Age of Onset  . Down syndrome Sister     Allergies  Allergen Reactions  . Ambien [Zolpidem] Other (See Comments)    Pt reports intolerance due to dizziness; prefers not to take.   . Decadron [Dexamethasone] Other (See Comments)    Makes her skin crawl    Prescriptions prior to admission  Medication Sig Dispense Refill  . famotidine (PEPCID) 40 MG tablet Take 1 tablet (40 mg total) by mouth daily.  40 tablet  1  . fluconazole (DIFLUCAN) 150 MG tablet Take 1 tablet (150 mg total) by mouth once.  2 tablet  0  . metroNIDAZOLE (FLAGYL) 500 MG tablet Take  500 mg by mouth 3 (three) times daily. 7 day supply started on 03-08-13      . oxyCODONE-acetaminophen (PERCOCET/ROXICET) 5-325 MG per tablet Take 2 tablets by mouth every 4 (four) hours as needed for severe pain.  30 tablet  0  . Prenatal Vit-Min-FA-Fish Oil ([employer]S PRENATAL GUMMY PO) Take 1 each by mouth daily.      . ursodiol (ACTIGALL) 300 MG capsule Take 1 capsule (300 mg total) by mouth 2 (two) times daily.  90 capsule  1    . lactated ringers  1,000 mL Intravenous Once  .  morphine injection  6 mg Intravenous STAT   I have reviewed the patient's current medications.  Review of Systems - neg except as above  Vitals:  BP 99/57  Pulse 108  Temp(Src) 99.3 F (37.4 C) (Oral)  Resp 20  LMP 06/19/2013  GENERAL: Well-developed, well-nourished female uncomfortable on  the bed.  LUNGS: Clear to auscultation bilaterally.  HEART: Regular rate and rhythm.  ABDOMEN: Soft, nontender, TTP in epigastrium andbilateral upper quadrants with most of the pain in the epigastrium. No rebound. No guarding.  gravid.  GU: deferred as just done yesterday.  EXTREMITIES: Nontender, no edema, 2+ distal pulses.  Cervical Exam: Dilatation 1cm Effacement 40% Station -3  FHT: Baseline rate 140 bpm Variability moderate Accelerations present Decelerations none  Contractions: rare      Labs:  Results for orders placed during the hospital encounter of 03/12/14 (from the past 24 hour(s))  COMPREHENSIVE METABOLIC PANEL   Collection Time    03/13/14 12:35 AM      Result Value Ref Range   Sodium 135 (*) 137 - 147 mEq/L   Potassium 4.2  3.7 - 5.3 mEq/L   Chloride 101  96 - 112 mEq/L   CO2 21  19 - 32 mEq/L   Glucose, Bld 74  70 - 99 mg/dL   BUN 4 (*) 6 - 23 mg/dL   Creatinine, Ser 0.52  0.50 - 1.10 mg/dL   Calcium 8.3 (*) 8.4 - 10.5 mg/dL   Total Protein 6.2  6.0 - 8.3 g/dL   Albumin 2.7 (*) 3.5 - 5.2 g/dL   AST 34  0 - 37 U/L   ALT 15  0 - 35 U/L   Alkaline Phosphatase 171 (*) 39 - 117 U/L   Total Bilirubin 0.3  0.3 - 1.2 mg/dL   GFR calc non Af Amer >90  >90 mL/min   GFR calc Af Amer >90  >90 mL/min  LIPASE, BLOOD   Collection Time    03/13/14 12:35 AM      Result Value Ref Range   Lipase 40  11 - 59 U/L  CBC WITH DIFFERENTIAL   Collection Time    03/13/14 12:35 AM      Result Value Ref Range   WBC 8.6  4.0 - 10.5 K/uL   RBC 3.54 (*) 3.87 - 5.11 MIL/uL   Hemoglobin 9.4 (*) 12.0 - 15.0 g/dL   HCT 29.3 (*) 36.0 - 46.0 %   MCV 82.8  78.0 - 100.0 fL   MCH 26.6  26.0 - 34.0 pg   MCHC 32.1  30.0 - 36.0 g/dL   RDW 14.5  11.5 - 15.5 %   Platelets 182  150 - 400 K/uL   Neutrophils Relative % 78 (*) 43 - 77 %   Neutro Abs 6.7  1.7 - 7.7 K/uL   Lymphocytes Relative 16  12 - 46 %   Lymphs Abs  1.4  0.7 - 4.0 K/uL   Monocytes Relative 6  3 - 12 %   Monocytes Absolute  0.5  0.1 - 1.0 K/uL   Eosinophils Relative 0  0 - 5 %   Eosinophils Absolute 0.0  0.0 - 0.7 K/uL   Basophils Relative 0  0 - 1 %   Basophils Absolute 0.0  0.0 - 0.1 K/uL  URINALYSIS, ROUTINE W REFLEX MICROSCOPIC   Collection Time    03/13/14  1:00 AM      Result Value Ref Range   Color, Urine YELLOW  YELLOW   APPearance CLEAR  CLEAR   Specific Gravity, Urine 1.015  1.005 - 1.030   pH 6.5  5.0 - 8.0   Glucose, UA NEGATIVE  NEGATIVE mg/dL   Hgb urine dipstick NEGATIVE  NEGATIVE   Bilirubin Urine NEGATIVE  NEGATIVE   Ketones, ur 40 (*) NEGATIVE mg/dL   Protein, ur NEGATIVE  NEGATIVE mg/dL   Urobilinogen, UA 0.2  0.0 - 1.0 mg/dL   Nitrite NEGATIVE  NEGATIVE   Leukocytes, UA NEGATIVE  NEGATIVE    Imaging Studies: US Ob Follow Up  03/08/2014   OBSTETRICAL ULTRASOUND: This exam was performed within a [facility]. The OB US report was generated in the [facility] system, and faxed to the ordering physician.   This report is also available in [employer] and in the [employer]. See [facility] Obstetric US report.  US Renal  03/13/2014   CLINICAL DATA:  Thirty-three weeks pregnant. Rule out hydronephrosis  EXAM: RENAL/URINARY TRACT ULTRASOUND COMPLETE  COMPARISON:  Renal ultrasound 11/20/2013  FINDINGS: Right Kidney:  Length: 11.9 cm. Moderate hydronephrosis, progressive since the prior ultrasound. Negative for renal mass.  Left Kidney:  Length: 11.2 cm. Echogenicity within normal limits. No mass or hydronephrosis visualized.  Bladder:  Appears normal for degree of bladder distention.  IMPRESSION: Moderate right hydronephrosis, progressive since the prior study. No obstruction of the left kidney.   Electronically Signed   By: Franchot Gallo M.D.   On: 03/13/2014 03:59   US Abdomen Limited Ruq  03/11/2014   CLINICAL DATA:  Upper abdominal pain  EXAM: US ABDOMEN LIMITED - RIGHT UPPER QUADRANT  COMPARISON:  None.  FINDINGS: Gallbladder:  Within the gallbladder, there are  several small echogenic foci which shadow but do not show appreciable movement. There is no gallbladder wall thickening or pericholecystic fluid. No sonographic Murphy sign noted.  Common bile duct:  Diameter: 5 mm. There is no intrahepatic or extrahepatic biliary duct dilatation.  Liver:  No focal lesion identified. Within normal limits in parenchymal echogenicity.  IMPRESSION: Cholelithiasis.  Study otherwise unremarkable.   Electronically Signed   By: Lowella Grip M.D.   On: 03/11/2014 11:38     Assessment and Plan: Patient Active Problem List   Diagnosis Date Noted  . Cholestasis of pregnancy in third trimester 03/06/2014  . Trichomonal infection 03/06/2014  . Yeast infection of the vagina 03/04/2014  . Anxiety in pregnancy in second trimester, antepartum 12/10/2013  . Depression complicating pregnancy in second trimester, antepartum 12/10/2013  . Ultrasound recheck of fetal pyelectasis, antepartum 12/07/2013  . Low grade squamous intraepithelial lesion (LGSIL) on Pap smear 11/29/2013  . Marijuana use 11/22/2013  . Late prenatal care complicating pregnancy in second trimester 11/18/2013    Unclear etiology of pain.  Pt was monitored in the MAU but pain was unable to be controlled despite multiple doses of IV pain medication.  An US of the renal  system was obtained that showed moderate hydronephrosis on right but with a clear ureteral jet making obstruction less likely.  In discussion with the radiologist, this could be related to pregnancy alone but would be somewhat more severe than expected.  However, her pain does not lateralize to the affected side so I am less suspicious of stone.  Pain perception is out of proportion to exam.  DDX includes peptic ulcer disease vs. Biliary colic vs. Ursodiol reaction vs. MSK etiology.    1) admit to ante for obs for uncontrolled abdominal pain - routine orders - qshift NST  2) abdominal pain - morphine 6mg  q4hr - cont serial abd exams -  discuss with day team further workup and evaluation. Can consider CT for r/o of nephrolithiasis.  - start IV protonix - hold ursodiol   3) FWB - cat I tracing.   4) anticipate discharge pending improvement in pain control.     Ebbie Latus, MD OB fellow [facility], [place]

## 2014-03-13 NOTE — Progress Notes (Signed)
Pt states pain is worse with FM

## 2014-03-13 NOTE — Progress Notes (Signed)
Report called to Ruthe RN in Antenatal. Pt to antenatal Rm 157 via w/c

## 2014-03-13 NOTE — Progress Notes (Signed)
OK to d/c efm per Dr Beck 

## 2014-03-13 NOTE — H&P (Signed)
Attestation of Attending Supervision of Advanced Practitioner (PA/CNM/NP): Evaluation and management procedures were performed by the Advanced Practitioner under my supervision and collaboration.  I have reviewed the Advanced Practitioner's note and chart, and I agree with the management and plan.  Donnamae Jude, MD [facility] Attending 03/13/2014 8:55 AM

## 2014-03-13 NOTE — MAU Note (Signed)
Pt ambulated to BR

## 2014-03-13 NOTE — MAU Note (Signed)
Pt states when drank ginger ale caused L side of stomach to cramp

## 2014-03-14 ENCOUNTER — Ambulatory Visit (HOSPITAL_COMMUNITY)
Admit: 2014-03-14 | Discharge: 2014-03-14 | Disposition: A | Discharge status: Home / Self Care | Payer: Medicaid Other | Attending: Obstetrics & Gynecology | Admitting: Obstetrics & Gynecology

## 2014-03-14 DIAGNOSIS — K209 Esophagitis, unspecified without bleeding: Secondary | ICD-10-CM

## 2014-03-14 DIAGNOSIS — F3289 Other specified depressive episodes: Secondary | ICD-10-CM

## 2014-03-14 DIAGNOSIS — O99340 Other mental disorders complicating pregnancy, unspecified trimester: Secondary | ICD-10-CM

## 2014-03-14 DIAGNOSIS — F329 Major depressive disorder, single episode, unspecified: Secondary | ICD-10-CM

## 2014-03-14 DIAGNOSIS — F411 Generalized anxiety disorder: Secondary | ICD-10-CM

## 2014-03-14 MED ORDER — PANTOPRAZOLE SODIUM 40 MG PO TBEC: 40.0000 mg | DELAYED_RELEASE_TABLET | Freq: Every day | ORAL | Status: DC

## 2014-03-14 MED ORDER — HYDROMORPHONE HCL 2 MG PO TABS: 2.0000 mg | ORAL_TABLET | Freq: Four times a day (QID) | ORAL | Status: DC | PRN

## 2014-03-14 NOTE — Discharge Summary (Signed)
Antenatal Physician Discharge Summary  Patient ID: Krista Vaughn MRN: [id number] DOB/AGE: 1986-02-25 28 y.o.  Admit date: 03/12/2014 Discharge date: 03/14/2014  Admission Diagnoses: abdominal pain  Discharge Diagnoses: abdominal pain  Prenatal Procedures: MRI  Intrapartum Procedures: Neonatology, Maternal Fetal Medicine   Significant Diagnostic Studies:  Results for orders placed during the hospital encounter of 03/12/14 (from the past 168 hour(s))  COMPREHENSIVE METABOLIC PANEL   Collection Time    03/13/14 12:35 AM      Result Value Ref Range   Sodium 135 (*) 137 - 147 mEq/L   Potassium 4.2  3.7 - 5.3 mEq/L   Chloride 101  96 - 112 mEq/L   CO2 21  19 - 32 mEq/L   Glucose, Bld 74  70 - 99 mg/dL   BUN 4 (*) 6 - 23 mg/dL   Creatinine, Ser 0.52  0.50 - 1.10 mg/dL   Calcium 8.3 (*) 8.4 - 10.5 mg/dL   Total Protein 6.2  6.0 - 8.3 g/dL   Albumin 2.7 (*) 3.5 - 5.2 g/dL   AST 34  0 - 37 U/L   ALT 15  0 - 35 U/L   Alkaline Phosphatase 171 (*) 39 - 117 U/L   Total Bilirubin 0.3  0.3 - 1.2 mg/dL   GFR calc non Af Amer >90  >90 mL/min   GFR calc Af Amer >90  >90 mL/min  LIPASE, BLOOD   Collection Time    03/13/14 12:35 AM      Result Value Ref Range   Lipase 40  11 - 59 U/L  CBC WITH DIFFERENTIAL   Collection Time    03/13/14 12:35 AM      Result Value Ref Range   WBC 8.6  4.0 - 10.5 K/uL   RBC 3.54 (*) 3.87 - 5.11 MIL/uL   Hemoglobin 9.4 (*) 12.0 - 15.0 g/dL   HCT 29.3 (*) 36.0 - 46.0 %   MCV 82.8  78.0 - 100.0 fL   MCH 26.6  26.0 - 34.0 pg   MCHC 32.1  30.0 - 36.0 g/dL   RDW 14.5  11.5 - 15.5 %   Platelets 182  150 - 400 K/uL   Neutrophils Relative % 78 (*) 43 - 77 %   Neutro Abs 6.7  1.7 - 7.7 K/uL   Lymphocytes Relative 16  12 - 46 %   Lymphs Abs 1.4  0.7 - 4.0 K/uL   Monocytes Relative 6  3 - 12 %   Monocytes Absolute 0.5  0.1 - 1.0 K/uL   Eosinophils Relative 0  0 - 5 %   Eosinophils Absolute 0.0  0.0 - 0.7 K/uL   Basophils Relative 0  0 - 1 %    Basophils Absolute 0.0  0.0 - 0.1 K/uL  URINALYSIS, ROUTINE W REFLEX MICROSCOPIC   Collection Time    03/13/14  1:00 AM      Result Value Ref Range   Color, Urine YELLOW  YELLOW   APPearance CLEAR  CLEAR   Specific Gravity, Urine 1.015  1.005 - 1.030   pH 6.5  5.0 - 8.0   Glucose, UA NEGATIVE  NEGATIVE mg/dL   Hgb urine dipstick NEGATIVE  NEGATIVE   Bilirubin Urine NEGATIVE  NEGATIVE   Ketones, ur 40 (*) NEGATIVE mg/dL   Protein, ur NEGATIVE  NEGATIVE mg/dL   Urobilinogen, UA 0.2  0.0 - 1.0 mg/dL   Nitrite NEGATIVE  NEGATIVE   Leukocytes, UA NEGATIVE  NEGATIVE  URINE RAPID DRUG  SCREEN (HOSP PERFORMED)   Collection Time    03/13/14  1:00 AM      Result Value Ref Range   Opiates NONE DETECTED  NONE DETECTED   Cocaine NONE DETECTED  NONE DETECTED   Benzodiazepines NONE DETECTED  NONE DETECTED   Amphetamines NONE DETECTED  NONE DETECTED   Tetrahydrocannabinol POSITIVE (*) NONE DETECTED   Barbiturates POSITIVE (*) NONE DETECTED  Results for orders placed during the hospital encounter of 03/11/14 (from the past 168 hour(s))  URINALYSIS, ROUTINE W REFLEX MICROSCOPIC   Collection Time    03/11/14  9:20 AM      Result Value Ref Range   Color, Urine YELLOW  YELLOW   APPearance CLEAR  CLEAR   Specific Gravity, Urine 1.015  1.005 - 1.030   pH 6.5  5.0 - 8.0   Glucose, UA NEGATIVE  NEGATIVE mg/dL   Hgb urine dipstick TRACE (*) NEGATIVE   Bilirubin Urine NEGATIVE  NEGATIVE   Ketones, ur 15 (*) NEGATIVE mg/dL   Protein, ur NEGATIVE  NEGATIVE mg/dL   Urobilinogen, UA 0.2  0.0 - 1.0 mg/dL   Nitrite NEGATIVE  NEGATIVE   Leukocytes, UA SMALL (*) NEGATIVE  URINE MICROSCOPIC-ADD ON   Collection Time    03/11/14  9:20 AM      Result Value Ref Range   Squamous Epithelial / LPF RARE  RARE   WBC, UA 3-6  <3 WBC/hpf   Bacteria, UA RARE  RARE  CBC   Collection Time    03/11/14 10:39 AM      Result Value Ref Range   WBC 8.5  4.0 - 10.5 K/uL   RBC 3.29 (*) 3.87 - 5.11 MIL/uL   Hemoglobin  8.7 (*) 12.0 - 15.0 g/dL   HCT 27.2 (*) 36.0 - 46.0 %   MCV 82.7  78.0 - 100.0 fL   MCH 26.4  26.0 - 34.0 pg   MCHC 32.0  30.0 - 36.0 g/dL   RDW 14.5  11.5 - 15.5 %   Platelets 161  150 - 400 K/uL  COMPREHENSIVE METABOLIC PANEL   Collection Time    03/11/14 10:39 AM      Result Value Ref Range   Sodium 138  137 - 147 mEq/L   Potassium 3.8  3.7 - 5.3 mEq/L   Chloride 103  96 - 112 mEq/L   CO2 21  19 - 32 mEq/L   Glucose, Bld 86  70 - 99 mg/dL   BUN 3 (*) 6 - 23 mg/dL   Creatinine, Ser 0.62  0.50 - 1.10 mg/dL   Calcium 8.3 (*) 8.4 - 10.5 mg/dL   Total Protein 6.1  6.0 - 8.3 g/dL   Albumin 2.8 (*) 3.5 - 5.2 g/dL   AST 21  0 - 37 U/L   ALT 11  0 - 35 U/L   Alkaline Phosphatase 165 (*) 39 - 117 U/L   Total Bilirubin 0.3  0.3 - 1.2 mg/dL   GFR calc non Af Amer >90  >90 mL/min   GFR calc Af Amer >90  >90 mL/min  LIPASE, BLOOD   Collection Time    03/11/14 10:39 AM      Result Value Ref Range   Lipase 52  11 - 59 U/L  WET PREP, GENITAL   Collection Time    03/11/14 11:55 AM      Result Value Ref Range   Yeast Wet Prep HPF POC NONE SEEN  NONE SEEN   Trich, Wet  Prep NONE SEEN  NONE SEEN   Clue Cells Wet Prep HPF POC NONE SEEN  NONE SEEN   WBC, Wet Prep HPF POC MANY (*) NONE SEEN  FETAL FIBRONECTIN   Collection Time    03/11/14 11:55 AM      Result Value Ref Range   Fetal Fibronectin NEGATIVE  NEGATIVE  Results for orders placed during the hospital encounter of 03/08/14 (from the past 168 hour(s))  CBC   Collection Time    03/08/14  2:35 AM      Result Value Ref Range   WBC 8.6  4.0 - 10.5 K/uL   RBC 3.12 (*) 3.87 - 5.11 MIL/uL   Hemoglobin 8.3 (*) 12.0 - 15.0 g/dL   HCT 25.5 (*) 36.0 - 46.0 %   MCV 81.7  78.0 - 100.0 fL   MCH 26.6  26.0 - 34.0 pg   MCHC 32.5  30.0 - 36.0 g/dL   RDW 14.2  11.5 - 15.5 %   Platelets 147 (*) 150 - 400 K/uL  COMPREHENSIVE METABOLIC PANEL   Collection Time    03/08/14  2:35 AM      Result Value Ref Range   Sodium 137  137 - 147 mEq/L    Potassium 3.3 (*) 3.7 - 5.3 mEq/L   Chloride 103  96 - 112 mEq/L   CO2 21  19 - 32 mEq/L   Glucose, Bld 98  70 - 99 mg/dL   BUN 3 (*) 6 - 23 mg/dL   Creatinine, Ser 0.57  0.50 - 1.10 mg/dL   Calcium 8.4  8.4 - 10.5 mg/dL   Total Protein 6.0  6.0 - 8.3 g/dL   Albumin 2.8 (*) 3.5 - 5.2 g/dL   AST 22  0 - 37 U/L   ALT 11  0 - 35 U/L   Alkaline Phosphatase 144 (*) 39 - 117 U/L   Total Bilirubin 0.2 (*) 0.3 - 1.2 mg/dL   GFR calc non Af Amer >90  >90 mL/min   GFR calc Af Amer >90  >90 mL/min    Treatments: IV hydration, analgesia: Morphine and Protonix  Hospital Course:  This is a 28 y.o. [id number] with IUP at [redacted]w[redacted]d admitted for abdominal pain.  Pt reports that she has difficulty swallowing at times. She was no famotadine at home but, reports minimal relief.  She reports improved sx since she has been in the hospital on IV Protonix.  She had gallstones with no evidence of infection.  Her MRI reveal possible lower esophagitis and lower gastritis. No leaking of fluid and no bleeding.  She was deemed stable for discharge to home with outpatient follow up.  Discharge Exam: BP 115/71  Pulse 82  Temp(Src) 98.1 F (36.7 C) (Oral)  Resp 18  Ht 5' (1.524 m)  Wt 134 lb (60.782 kg)  BMI 26.17 kg/m2  LMP 06/19/2013 General appearance: alert and no distress GI: soft, non-tender; bowel sounds normal; no masses,  no organomegaly  Discharge Condition: good  Disposition: 01-Home or Self Care  Discharge Orders   Future Appointments Provider Department Dept Phone   03/15/2014 2:00 PM [facility] [phone]   03/17/2014 9:00 AM Kassie Mends, MD [facility] [phone]   Future Orders Complete By Expires   Discharge activity:  No Restrictions  As directed    Discharge diet:  No restrictions  As directed    No sexual activity restrictions  As directed    Notify physician for a  general feeling that "something is not right"  As directed    Notify physician for  increase or change in vaginal discharge  As directed    Notify physician for intestinal cramps, with or without diarrhea, sometimes described as "gas pain"  As directed    Notify physician for leaking of fluid  As directed    Notify physician for low, dull backache, unrelieved by heat or Tylenol  As directed    Notify physician for menstrual like cramps  As directed    Notify physician for pelvic pressure  As directed    Notify physician for uterine contractions.  These may be painless and feel like the uterus is tightening or the baby is  "balling up"  As directed    Notify physician for vaginal bleeding  As directed    PRETERM LABOR:  Includes any of the follwing symptoms that occur between 20 - [redacted] weeks gestation.  If these symptoms are not stopped, preterm labor can result in preterm delivery, placing your baby at risk  As directed        Medication List    STOP taking these medications       famotidine 40 MG tablet  Commonly known as:  PEPCID     oxyCODONE-acetaminophen 5-325 MG per tablet  Commonly known as:  PERCOCET/ROXICET      TAKE these medications       [employer] PRENATAL GUMMY PO  Take 1 each by mouth daily.     fluconazole 150 MG tablet  Commonly known as:  DIFLUCAN  Take 1 tablet (150 mg total) by mouth once.     HYDROmorphone 2 MG tablet  Commonly known as:  DILAUDID  Take 1 tablet (2 mg total) by mouth every 6 (six) hours as needed for severe pain.     metroNIDAZOLE 500 MG tablet  Commonly known as:  FLAGYL  Take 500 mg by mouth 3 (three) times daily. 7 day supply started on 03-08-13     pantoprazole 40 MG tablet  Commonly known as:  PROTONIX  Take 1 tablet (40 mg total) by mouth daily.     ursodiol 300 MG capsule  Commonly known as:  ACTIGALL  Take 1 capsule (300 mg total) by mouth 2 (two) times daily.           Follow-up Information   Follow up with [facility] In 2 weeks.      SignedLavonia Drafts M.D. 03/14/2014, 6:06 PM

## 2014-03-14 NOTE — Discharge Instructions (Signed)
Esophagitis  Esophagitis is inflammation of the esophagus. It can involve swelling, soreness, and pain in the esophagus. This condition can make it difficult and painful to swallow.  CAUSES   Most causes of esophagitis are not serious. Many different factors can cause esophagitis, including:   Gastroesophageal reflux disease (GERD). This is when acid from your stomach flows up into the esophagus.   Recurrent vomiting.   An allergic-type reaction.   Certain medicines, especially those that come in large pills.   Ingestion of harmful chemicals, such as household cleaning products.   Heavy alcohol use.   An infection of the esophagus.   Radiation treatment for cancer.   Certain diseases such as sarcoidosis, Crohn's disease, and scleroderma. These diseases may cause recurrent esophagitis.  SYMPTOMS    Trouble swallowing.   Painful swallowing.   Chest pain.   Difficulty breathing.   Nausea.   Vomiting.   Abdominal pain.  DIAGNOSIS   Your caregiver will take your history and do a physical exam. Depending upon what your caregiver finds, certain tests may also be done, including:   Barium X-ray. You will drink a solution that coats the esophagus, and X-rays will be taken.   Endoscopy. A lighted tube is put down the esophagus so your caregiver can examine the area.   Allergy tests. These can sometimes be arranged through follow-up visits.  TREATMENT   Treatment will depend on the cause of your esophagitis. In some cases, steroids or other medicines may be given to help relieve your symptoms or to treat the underlying cause of your condition. Medicines that may be recommended include:   Viscous lidocaine, to soothe the esophagus.   Antacids.   Acid reducers.   Proton pump inhibitors.   Antiviral medicines for certain viral infections of the esophagus.   Antifungal medicines for certain fungal infections of the esophagus.   Antibiotic medicines, depending on the cause of the esophagitis.  HOME CARE  INSTRUCTIONS    Avoid foods and drinks that seem to make your symptoms worse.   Eat small, frequent meals instead of large meals.   Avoid eating for the 3 hours prior to your bedtime.   If you have trouble taking pills, use a pill splitter to decrease the size and likelihood of the pill getting stuck or injuring the esophagus on the way down. Drinking water after taking a pill also helps.   Stop smoking if you smoke.   Maintain a healthy weight.   Wear loose-fitting clothing. Do not wear anything tight around your waist that causes pressure on your stomach.   Raise the head of your bed 6 to 8 inches with wood blocks to help you sleep. Extra pillows will not help.   Only take over-the-counter or prescription medicines as directed by your caregiver.  SEEK IMMEDIATE MEDICAL CARE IF:   You have severe chest pain that radiates into your arm, neck, or jaw.   You feel sweaty, dizzy, or lightheaded.   You have shortness of breath.   You vomit blood.   You have difficulty or pain with swallowing.   You have bloody or black, tarry stools.   You have a fever.   You have a burning sensation in the chest more than 3 times a week for more than 2 weeks.   You cannot swallow, drink, or eat.   You drool because you cannot swallow your saliva.  MAKE SURE YOU:   Understand these instructions.   Will watch your condition.     Will get help right away if you are not doing well or get worse.  Document Released: 01/24/2005 Document Revised: 03/10/2012 Document Reviewed: 08/17/2011  [employer] Patient Information 2014 [employer]

## 2014-03-14 NOTE — Progress Notes (Signed)
Pt. C/o increased back pain.  She was placed on toco to measure any UC.  She declines pain medication at this time.

## 2014-03-14 NOTE — Progress Notes (Signed)
Pt. complaining of itching on the outer aspect of her right lower leg. Small pimple like bump noted. Encouraged pt. not to scratch. Pt. denies needing anything for itching.

## 2014-03-14 NOTE — Progress Notes (Signed)
Pt back from [facility]

## 2014-03-14 NOTE — Progress Notes (Signed)
Pt. Care assumed by [facility] for transfer to [facility] MRI.

## 2014-03-14 NOTE — Progress Notes (Signed)
Patient ID: Krista Vaughn, female   DOB: Sep 26, 1986, 28 y.o.   MRN: [id number] ACULT[facility] PRACTICE ANTEPARTUM COMPREHENSIVE PROGRESS NOTE  Krista Vaughn is a 28 y.o. [id number] at [redacted]w[redacted]d  who is admitted for right upper quadrant pain.   Fetal presentation is cephalic. Length of Stay:  2  Days  Subjective: Pt reports that she thinks that her pain is related to the gallstones. No N/V overnight.    Patient reports good fetal movement.  She reports no uterine contractions, no bleeding and no loss of fluid per vagina.  Vitals:  Blood pressure 98/59, pulse 84, temperature 97.9 F (36.6 C), temperature source Oral, resp. rate 18, height 5' (1.524 m), weight 134 lb (60.782 kg), last menstrual period 06/19/2013. Physical Examination: General appearance - alert, well appearing, and in no distress Abdomen - soft, nontender, nondistended, no masses or organomegaly Gravid- pt did not even grimmace when i was palpating her abd Extremities - no pedal edema noted Cervical Exam: Not evaluated. Membranes:intact  Fetal Monitoring:  Baseline: 120's bpm, Variability: Good {> 6 bpm), Accelerations: Reactive and Decelerations: Absent Toco: no contractions  Labs: CBC    Component Value Date/Time   WBC 8.6 03/13/2014 0035   RBC 3.54* 03/13/2014 0035   HGB 9.4* 03/13/2014 0035   HCT 29.3* 03/13/2014 0035   PLT 182 03/13/2014 0035   MCV 82.8 03/13/2014 0035   MCH 26.6 03/13/2014 0035   MCHC 32.1 03/13/2014 0035   RDW 14.5 03/13/2014 0035   LYMPHSABS 1.4 03/13/2014 0035   MONOABS 0.5 03/13/2014 0035   EOSABS 0.0 03/13/2014 0035   BASOSABS 0.0 03/13/2014 0035   CMP     Component Value Date/Time   NA 135* 03/13/2014 0035   K 4.2 03/13/2014 0035   CL 101 03/13/2014 0035   CO2 21 03/13/2014 0035   GLUCOSE 74 03/13/2014 0035   BUN 4* 03/13/2014 0035   CREATININE 0.52 03/13/2014 0035   CALCIUM 8.3* 03/13/2014 0035   PROT 6.2 03/13/2014 0035   ALBUMIN 2.7* 03/13/2014 0035   AST 34 03/13/2014 0035   ALT 15 03/13/2014 0035    ALKPHOS 171* 03/13/2014 0035   BILITOT 0.3 03/13/2014 0035   GFRNONAA >90 03/13/2014 0035   GFRAA >90 03/13/2014 0035      Imaging Studies:    Abd sono- hydronephrosis with no obstruction    Medications:  Scheduled . docusate sodium  100 mg Oral Daily  . pantoprazole (PROTONIX) IV  40 mg Intravenous Q12H  . prenatal multivitamin  1 tablet Oral Q1200   I have reviewed the patient's current medications.  ASSESSMENT: Patient Active Problem List   Diagnosis Date Noted  . Abdominal pain complicating pregnancy, antepartum 03/13/2014  . Cholestasis of pregnancy in third trimester 03/06/2014  . Trichomonal infection 03/06/2014  . Yeast infection of the vagina 03/04/2014  . Anxiety in pregnancy in second trimester, antepartum 12/10/2013  . Depression complicating pregnancy in second trimester, antepartum 12/10/2013  . Ultrasound recheck of fetal pyelectasis, antepartum 12/07/2013  . Low grade squamous intraepithelial lesion (LGSIL) on Pap smear 11/29/2013  . Marijuana use 11/22/2013  . Late prenatal care complicating pregnancy in second trimester 11/18/2013    PLAN: Delivery @ 37 weeks for cholestasis per MFM MRI today to r/o other abd pathology If MRI normal or no acute changes, will d/c to home later this afternoon on po pain meds  Continue routine antenatal care.   Krista Vaughn 03/14/2014,9:25 AM

## 2014-03-15 ENCOUNTER — Ambulatory Visit (INDEPENDENT_AMBULATORY_CARE_PROVIDER_SITE_OTHER): Payer: Medicaid Other | Admitting: *Deleted

## 2014-03-15 DIAGNOSIS — K831 Obstruction of bile duct: Secondary | ICD-10-CM

## 2014-03-15 DIAGNOSIS — O26613 Liver and biliary tract disorders in pregnancy, third trimester: Principal | ICD-10-CM

## 2014-03-15 DIAGNOSIS — K838 Other specified diseases of biliary tract: Secondary | ICD-10-CM

## 2014-03-15 DIAGNOSIS — O26619 Liver and biliary tract disorders in pregnancy, unspecified trimester: Secondary | ICD-10-CM

## 2014-03-15 LAB — US OB FOLLOW UP

## 2014-03-15 LAB — H. PYLORI ANTIBODY, IGG: H PYLORI IGG: 3.9 {ISR} — AB

## 2014-03-15 NOTE — Progress Notes (Signed)
Pt left hospital last night following admission for abdominal pain and difficulty swallowing/eating.  She had reactive FHR tracing most recently yesterday @ 1828, therefore NST not indicated for today's appt.   AFI completed as planned. Next NST and Ob fu on 3/18.

## 2014-03-17 ENCOUNTER — Encounter: Payer: Medicaid Other | Admitting: Family Medicine

## 2014-03-18 ENCOUNTER — Ambulatory Visit (INDEPENDENT_AMBULATORY_CARE_PROVIDER_SITE_OTHER): Payer: Medicaid Other | Admitting: Obstetrics & Gynecology

## 2014-03-18 ENCOUNTER — Encounter: Payer: Self-pay | Admitting: *Deleted

## 2014-03-18 ENCOUNTER — Encounter: Payer: Self-pay | Admitting: Obstetrics & Gynecology

## 2014-03-18 VITALS — BP 102/63 | Wt 134.7 lb

## 2014-03-18 DIAGNOSIS — O26619 Liver and biliary tract disorders in pregnancy, unspecified trimester: Secondary | ICD-10-CM

## 2014-03-18 DIAGNOSIS — O0930 Supervision of pregnancy with insufficient antenatal care, unspecified trimester: Secondary | ICD-10-CM

## 2014-03-18 DIAGNOSIS — K831 Obstruction of bile duct: Secondary | ICD-10-CM

## 2014-03-18 DIAGNOSIS — K838 Other specified diseases of biliary tract: Secondary | ICD-10-CM

## 2014-03-18 DIAGNOSIS — O26613 Liver and biliary tract disorders in pregnancy, third trimester: Principal | ICD-10-CM

## 2014-03-18 LAB — POCT URINALYSIS DIP (DEVICE)
GLUCOSE, UA: NEGATIVE mg/dL
Hgb urine dipstick: NEGATIVE
Ketones, ur: 40 mg/dL — AB
NITRITE: NEGATIVE
PH: 7 (ref 5.0–8.0)
Protein, ur: NEGATIVE mg/dL
SPECIFIC GRAVITY, URINE: 1.02 (ref 1.005–1.030)
Urobilinogen, UA: 1 mg/dL (ref 0.0–1.0)

## 2014-03-18 LAB — OB RESULTS CONSOLE GBS: GBS: POSITIVE

## 2014-03-18 MED ORDER — PANTOPRAZOLE SODIUM 40 MG PO TBEC: 40.0000 mg | DELAYED_RELEASE_TABLET | Freq: Two times a day (BID) | ORAL | Status: DC

## 2014-03-18 NOTE — Progress Notes (Signed)
P = 79   Pt states she is still having difficulty swallowing and eating.  States that it burns and gives her chest pain when she eats.  She is frustrated because she is "always hungry". Has had 5lb weight loss in last 2 weeks. Will schedule IOL at 37 wks during next visit.

## 2014-03-18 NOTE — Progress Notes (Signed)
Pt still c/o gatritis and esophagitis pain.  Pt taking protonix, actigal and dialudid.  Pt only taking protonix daily--will increase to bid.  Pt tearful and wants to be induced.  Pt understands we must wait until 37 weeks unless there is a maternal or fetal indication (pt has cholestasis of pregnancy based on bile salts). Continue 2x week testing.  BTL papers signed.  Pt to see nutrition at next visit.  She is having weight loss from the pain.  Pt to start Ensure 2x a day.

## 2014-03-19 LAB — CULTURE, BETA STREP (GROUP B ONLY)

## 2014-03-22 ENCOUNTER — Ambulatory Visit (INDEPENDENT_AMBULATORY_CARE_PROVIDER_SITE_OTHER): Payer: Medicaid Other | Admitting: *Deleted

## 2014-03-22 VITALS — BP 110/62

## 2014-03-22 DIAGNOSIS — K831 Obstruction of bile duct: Secondary | ICD-10-CM

## 2014-03-22 DIAGNOSIS — O26613 Liver and biliary tract disorders in pregnancy, third trimester: Principal | ICD-10-CM

## 2014-03-22 DIAGNOSIS — O26619 Liver and biliary tract disorders in pregnancy, unspecified trimester: Secondary | ICD-10-CM

## 2014-03-22 DIAGNOSIS — K838 Other specified diseases of biliary tract: Secondary | ICD-10-CM

## 2014-03-22 LAB — US OB FOLLOW UP

## 2014-03-22 NOTE — Progress Notes (Signed)
P = 78   Pt reports that she has been eating and feeling much better since increasing the protonix.

## 2014-03-23 ENCOUNTER — Encounter: Payer: Self-pay | Admitting: Obstetrics & Gynecology

## 2014-03-25 ENCOUNTER — Other Ambulatory Visit: Payer: Self-pay | Admitting: Obstetrics & Gynecology

## 2014-03-25 ENCOUNTER — Telehealth (HOSPITAL_COMMUNITY): Payer: Self-pay | Admitting: *Deleted

## 2014-03-25 ENCOUNTER — Encounter (HOSPITAL_COMMUNITY): Payer: Self-pay | Admitting: *Deleted

## 2014-03-25 ENCOUNTER — Ambulatory Visit (HOSPITAL_COMMUNITY)
Admission: RE | Admit: 2014-03-25 | Discharge: 2014-03-25 | Disposition: A | Discharge status: Home / Self Care | Payer: Medicaid Other | Source: Ambulatory Visit | Attending: Obstetrics & Gynecology | Admitting: Obstetrics & Gynecology

## 2014-03-25 ENCOUNTER — Ambulatory Visit (INDEPENDENT_AMBULATORY_CARE_PROVIDER_SITE_OTHER): Payer: Medicaid Other | Admitting: Obstetrics & Gynecology

## 2014-03-25 VITALS — BP 108/71 | Temp 98.8°F | Wt 137.6 lb

## 2014-03-25 DIAGNOSIS — K838 Other specified diseases of biliary tract: Secondary | ICD-10-CM

## 2014-03-25 DIAGNOSIS — O4690 Antepartum hemorrhage, unspecified, unspecified trimester: Secondary | ICD-10-CM | POA: Insufficient documentation

## 2014-03-25 DIAGNOSIS — O26619 Liver and biliary tract disorders in pregnancy, unspecified trimester: Secondary | ICD-10-CM

## 2014-03-25 DIAGNOSIS — O26613 Liver and biliary tract disorders in pregnancy, third trimester: Principal | ICD-10-CM

## 2014-03-25 DIAGNOSIS — O0930 Supervision of pregnancy with insufficient antenatal care, unspecified trimester: Secondary | ICD-10-CM

## 2014-03-25 DIAGNOSIS — O0932 Supervision of pregnancy with insufficient antenatal care, second trimester: Secondary | ICD-10-CM

## 2014-03-25 DIAGNOSIS — O99330 Smoking (tobacco) complicating pregnancy, unspecified trimester: Secondary | ICD-10-CM | POA: Insufficient documentation

## 2014-03-25 DIAGNOSIS — Z23 Encounter for immunization: Secondary | ICD-10-CM

## 2014-03-25 DIAGNOSIS — K831 Obstruction of bile duct: Secondary | ICD-10-CM

## 2014-03-25 LAB — POCT URINALYSIS DIP (DEVICE)
BILIRUBIN URINE: NEGATIVE
Glucose, UA: NEGATIVE mg/dL
Ketones, ur: NEGATIVE mg/dL
Nitrite: NEGATIVE
Protein, ur: NEGATIVE mg/dL
Specific Gravity, Urine: 1.015 (ref 1.005–1.030)
Urobilinogen, UA: 1 mg/dL (ref 0.0–1.0)
pH: 7 (ref 5.0–8.0)

## 2014-03-25 MED ORDER — TETANUS-DIPHTH-ACELL PERTUSSIS 5-2.5-18.5 LF-MCG/0.5 IM SUSP: 0.5000 mL | Freq: Once | INTRAMUSCULAR | Status: DC

## 2014-03-25 NOTE — Progress Notes (Signed)
U/S scheduled with MFM today at 3 pm. Patient will come a few minutes before 1 pm to try and get worked in sooner.

## 2014-03-25 NOTE — Progress Notes (Signed)
GBS--positive; will treat in labor; RNST (testing for cholestasis in pregnancy). Induction at 37 weeks.  Pt c/o vaginal bleeding.  Speculum exam negative for blood.  Will get [place] to look at placenta.  Pt reports good fetal mvmt.   No trauma. Abdominal pain much better on Protonix bid.

## 2014-03-25 NOTE — Telephone Encounter (Signed)
Preadmission screen  

## 2014-03-25 NOTE — Progress Notes (Signed)
Pulse- 87 Patient reports pelvic pressure and irregular contractions; stated she started to bleed last night, little more than spotting

## 2014-03-27 ENCOUNTER — Encounter (HOSPITAL_COMMUNITY): Payer: Self-pay | Admitting: *Deleted

## 2014-03-27 ENCOUNTER — Inpatient Hospital Stay (HOSPITAL_COMMUNITY)
Admission: AD | Admit: 2014-03-27 | Discharge: 2014-03-27 | Disposition: A | Discharge status: Home / Self Care | Payer: Medicaid Other | Source: Ambulatory Visit | Attending: Obstetrics & Gynecology | Admitting: Obstetrics & Gynecology

## 2014-03-27 DIAGNOSIS — O2650 Maternal hypotension syndrome, unspecified trimester: Secondary | ICD-10-CM | POA: Insufficient documentation

## 2014-03-27 DIAGNOSIS — O35EXX0 Maternal care for other (suspected) fetal abnormality and damage, fetal genitourinary anomalies, not applicable or unspecified: Secondary | ICD-10-CM

## 2014-03-27 DIAGNOSIS — R42 Dizziness and giddiness: Secondary | ICD-10-CM | POA: Insufficient documentation

## 2014-03-27 DIAGNOSIS — K209 Esophagitis, unspecified without bleeding: Secondary | ICD-10-CM

## 2014-03-27 DIAGNOSIS — A599 Trichomoniasis, unspecified: Secondary | ICD-10-CM

## 2014-03-27 DIAGNOSIS — Z87891 Personal history of nicotine dependence: Secondary | ICD-10-CM | POA: Insufficient documentation

## 2014-03-27 DIAGNOSIS — K831 Obstruction of bile duct: Secondary | ICD-10-CM

## 2014-03-27 DIAGNOSIS — O358XX0 Maternal care for other (suspected) fetal abnormality and damage, not applicable or unspecified: Secondary | ICD-10-CM

## 2014-03-27 DIAGNOSIS — O26613 Liver and biliary tract disorders in pregnancy, third trimester: Secondary | ICD-10-CM

## 2014-03-27 DIAGNOSIS — O26643 Intrahepatic cholestasis of pregnancy, third trimester: Secondary | ICD-10-CM

## 2014-03-27 DIAGNOSIS — O0932 Supervision of pregnancy with insufficient antenatal care, second trimester: Secondary | ICD-10-CM

## 2014-03-27 DIAGNOSIS — R1032 Left lower quadrant pain: Secondary | ICD-10-CM | POA: Insufficient documentation

## 2014-03-27 LAB — URINALYSIS, ROUTINE W REFLEX MICROSCOPIC
Bilirubin Urine: NEGATIVE
Glucose, UA: NEGATIVE mg/dL
Hgb urine dipstick: NEGATIVE
KETONES UR: NEGATIVE mg/dL
LEUKOCYTES UA: NEGATIVE
Nitrite: NEGATIVE
Protein, ur: NEGATIVE mg/dL
Specific Gravity, Urine: 1.01 (ref 1.005–1.030)
Urobilinogen, UA: 0.2 mg/dL (ref 0.0–1.0)
pH: 7 (ref 5.0–8.0)

## 2014-03-27 LAB — RAPID URINE DRUG SCREEN, HOSP PERFORMED
AMPHETAMINES: NOT DETECTED
Barbiturates: NOT DETECTED
Benzodiazepines: NOT DETECTED
Cocaine: NOT DETECTED
Opiates: NOT DETECTED
Tetrahydrocannabinol: POSITIVE — AB

## 2014-03-27 NOTE — Discharge Instructions (Signed)
Vasovagal Syncope, Adult  Syncope, commonly known as fainting, is a temporary loss of consciousness. It occurs when the blood flow to the brain is reduced. Vasovagal syncope (also called neurocardiogenic syncope) is a fainting spell in which the blood flow to the brain is reduced because of a sudden drop in heart rate and blood pressure. Vasovagal syncope occurs when the brain and the cardiovascular system (blood vessels) do not adequately communicate and respond to each other. This is the most common cause of fainting. It often occurs in response to fear or some other type of emotional or physical stress. The body has a reaction in which the heart starts beating too slowly or the blood vessels expand, reducing blood pressure. This type of fainting spell is generally considered harmless. However, injuries can occur if a person takes a sudden fall during a fainting spell.   CAUSES   Vasovagal syncope occurs when a person's blood pressure and heart rate decrease suddenly, usually in response to a trigger. Many things and situations can trigger an episode. Some of these include:   · Pain.    · Fear.    · The sight of blood or medical procedures, such as blood being drawn from a vein.    · Common activities, such as coughing, swallowing, stretching, or going to the bathroom.    · Emotional stress.    · Prolonged standing, especially in a warm environment.    · Lack of sleep or rest.    · Prolonged lack of food.    · Prolonged lack of fluids.    · Recent illness.  · The use of certain drugs that affect blood pressure, such as cocaine, alcohol, marijuana, inhalants, and opiates.    SYMPTOMS   Before the fainting episode, you may:   · Feel dizzy or light headed.    · Become pale.  · Sense that you are going to faint.    · Feel like the room is spinning.    · Have tunnel vision, only seeing directly in front of you.    · Feel sick to your stomach (nauseous).    · See spots or slowly lose vision.    · Hear ringing in your  ears.    · Have a headache.    · Feel warm and sweaty.    · Feel a sensation of pins and needles.  During the fainting spell, you will generally be unconscious for no longer than a couple minutes before waking up and returning to normal. If you get up too quickly before your body can recover, you may faint again. Some twitching or jerky movements may occur during the fainting spell.   DIAGNOSIS   Your caregiver will ask about your symptoms, take a medical history, and perform a physical exam. Various tests may be done to rule out other causes of fainting. These may include blood tests and tests to check the heart, such as electrocardiography, echocardiography, and possibly an electrophysiology study. When other causes have been ruled out, a test may be done to check the body's response to changes in position (tilt table test).  TREATMENT   Most cases of vasovagal syncope do not require treatment. Your caregiver may recommend ways to avoid fainting triggers and may provide home strategies for preventing fainting. If you must be exposed to a possible trigger, you can drink additional fluids to help reduce your chances of having an episode of vasovagal syncope. If you have warning signs of an oncoming episode, you can respond by positioning yourself favorably (lying down).  If your fainting spells continue, you may be   given medicines to prevent fainting. Some medicines may help make you more resistant to repeated episodes of vasovagal syncope. Special exercises or compression stockings may be recommended. In rare cases, the surgical placement of a pacemaker is considered.  HOME CARE INSTRUCTIONS   · Learn to identify the warning signs of vasovagal syncope.    · Sit or lie down at the first warning sign of a fainting spell. If sitting, put your head down between your legs. If you lie down, swing your legs up in the air to increase blood flow to the brain.    · Avoid hot tubs and saunas.  · Avoid prolonged  standing.  · Drink enough fluids to keep your urine clear or pale yellow. Avoid caffeine.  · Increase salt in your diet as directed by your caregiver.    · If you have to stand for a long time, perform movements such as:    · Crossing your legs.    · Flexing and stretching your leg muscles.    · Squatting.    · Moving your legs.    · Bending over.    · Only take over-the-counter or prescription medicines as directed by your caregiver. Do not suddenly stop any medicines without asking your caregiver first.   SEEK MEDICAL CARE IF:   · Your fainting spells continue or happen more frequently in spite of treatment.    · You lose consciousness for more than a couple minutes.  · You have fainting spells during or after exercising or after being startled.    · You have new symptoms that occur with the fainting spells, such as:    · Shortness of breath.  · Chest pain.    · Irregular heartbeat.    · You have episodes of twitching or jerky movements that last longer than a few seconds.  · You have episodes of twitching or jerky movements without obvious fainting.  SEEK IMMEDIATE MEDICAL CARE IF:   · You have injuries or bleeding after a fainting spell.    · You have episodes of twitching or jerky movements that last longer than 5 minutes.    · You have more than one spell of twitching or jerky movements before returning to consciousness after fainting.  MAKE SURE YOU:   · Understand these instructions.  · Will watch your condition.  · Will get help right away if you are not doing well or get worse.  Document Released: 12/03/2012 Document Reviewed: 12/03/2012  [employer]® Patient Information ©2014 [employer].

## 2014-03-27 NOTE — MAU Provider Note (Signed)
Attestation of Attending Supervision of Obstetric Fellow: Evaluation and management procedures were performed by the Obstetric Fellow under my supervision and collaboration.  I have reviewed the Obstetric Fellow's note and chart, and I agree with the management and plan.  Telina Kleckley, MD, FACOG Attending Obstetrician & Gynecologist Faculty Practice, [facility]   

## 2014-03-27 NOTE — MAU Provider Note (Signed)
Chief Complaint:  Dizziness  Krista Vaughn is a 28 y.o.  [phone] with IUP at [redacted]w[redacted]d presenting for Dizziness She reports feeling "dizzy, like I'm about to pass out" this morning, and said her BP was low when her aunt check it. She is currently asymptomatic after "drinking lots of water in case I was dehydrated." She denies any n/v/d, fevers, cough, or dysuria. She denies any new medications, but has been compliant with Actigall.     Patient states she has been having  none contractions, none vaginal bleeding, intact membranes, with active fetal movement.    Menstrual History: OB History   Grav Para Term Preterm Abortions TAB SAB Ect Mult Living   6 2 2  3 3  0   2     Patient's last menstrual period was 06/19/2013.      Past Medical History  Diagnosis Date  . Chlamydia   . Bronchitis   . Depression   . Anxiety   . Migraine   . Cholecystitis     Past Surgical History  Procedure Laterality Date  . Induced abortion    . Therapeutic abortion      Family History  Problem Relation Age of Onset  . Down syndrome Sister     History  Substance Use Topics  . Smoking status: Former Smoker    Types: Cigarettes    Quit date: 01/08/2014  . Smokeless tobacco: Never Used  . Alcohol Use: No      Allergies  Allergen Reactions  . Ambien [Zolpidem] Other (See Comments)    Pt reports intolerance due to dizziness; prefers not to take.   . Decadron [Dexamethasone] Other (See Comments)    Makes her skin crawl    Facility-administered medications prior to admission  Medication Dose Route Frequency Provider Last Rate Last Dose  . Tdap (BOOSTRIX) injection 0.5 mL  0.5 mL Intramuscular Once Guss Bunde, MD       Prescriptions prior to admission  Medication Sig Dispense Refill  . diphenhydrAMINE (BENADRYL) 25 MG tablet Take 25 mg by mouth as needed for sleep.      Marland Kitchen HYDROmorphone (DILAUDID) 2 MG tablet Take 1 tablet (2 mg total) by mouth every 6 (six) hours as needed for  severe pain.  30 tablet  0  . pantoprazole (PROTONIX) 40 MG tablet Take 1 tablet (40 mg total) by mouth 2 (two) times daily.  60 tablet  3  . Prenatal Vit-Min-FA-Fish Oil ([employer] PRENATAL GUMMY PO) Take 1 each by mouth daily.      . ursodiol (ACTIGALL) 300 MG capsule Take 1 capsule (300 mg total) by mouth 2 (two) times daily.  90 capsule  1    Review of Systems - Negative except for what is mentioned in HPI.  Physical Exam  Blood pressure 107/60, pulse 121, temperature 98 F (36.7 C), temperature source Oral, resp. rate 16, height 5' (1.524 m), weight 61.598 kg (135 lb 12.8 oz), last menstrual period 06/19/2013, SpO2 99.00%. GENERAL: Well-developed, well-nourished female in no acute distress.  LUNGS: Clear to auscultation bilaterally.  HEART: Regular rate and rhythm. ABDOMEN: Soft, nontender, nondistended, gravid.  EXTREMITIES: Nontender, no edema, 2+ distal pulses. FHT:  Baseline rate 130 bpm   Variability moderate  Accelerations present   Decelerations none Contractions: none   Labs: Results for orders placed during the hospital encounter of 03/27/14 (from the past 24 hour(s))  URINALYSIS, ROUTINE W REFLEX MICROSCOPIC   Collection Time    03/27/14  4:45 PM  Result Value Ref Range   Color, Urine YELLOW  YELLOW   APPearance CLEAR  CLEAR   Specific Gravity, Urine 1.010  1.005 - 1.030   pH 7.0  5.0 - 8.0   Glucose, UA NEGATIVE  NEGATIVE mg/dL   Hgb urine dipstick NEGATIVE  NEGATIVE   Bilirubin Urine NEGATIVE  NEGATIVE   Ketones, ur NEGATIVE  NEGATIVE mg/dL   Protein, ur NEGATIVE  NEGATIVE mg/dL   Urobilinogen, UA 0.2  0.0 - 1.0 mg/dL   Nitrite NEGATIVE  NEGATIVE   Leukocytes, UA NEGATIVE  NEGATIVE   Imaging Studies:  Mr Abdomen Wo Contrast  03/14/2014   CLINICAL DATA:  Left upper quadrant abdominal pain. 34.[redacted] weeks pregnant. History of cholecystitis.  EXAM: MRI ABDOMEN WITHOUT CONTRAST  TECHNIQUE: Multiplanar multisequence MR imaging was performed without the administration of  intravenous contrast.  COMPARISON:  US RENAL dated 03/13/2014; US OB FOLLOW UP dated 03/08/2014  FINDINGS: Edema throughout the breasts with small bilateral pleural effusions. This suggests a component of fluid overload. Normal heart size. A moderate hiatal hernia. Question of edema within the distal esophagus and herniated stomach on image 5/series 4.  Portions of exam are mildly motion degraded. Normal liver, spleen, distal stomach, pancreas. No intrahepatic biliary ductal dilatation. Normal gallbladder, without common duct dilatation.  Normal adrenal glands and left kidney. There is moderate right-sided hydroureteronephrosis. This is likely physiologic and secondary to the gravid uterus. Mass effect upon the right ureter most apparent on images 18 and 17.  Normal caliber of abdominal bowel loops. No gross ascites or adenopathy.  Fetus noted and cephalic in position. Exam not performed to optimally evaluate the fetus.  IMPRESSION: 1. Moderate right-sided hydroureteronephrosis, without obstructive mass. This is likely physiologic and related to the gravid uterus. 2. No explanation for left-sided pain. 3. Small bilateral pleural effusions and edema throughout the breasts. This could relate to a component of fluid overload or be physiologic. 4. Moderate hiatal hernia. Cannot exclude distal esophagitis and proximal gastritis.   Electronically Signed   By: Abigail Miyamoto M.D.   On: 03/14/2014 17:20   US Ob Limited  03/25/2014   OBSTETRICAL ULTRASOUND: This exam was performed within a [facility]. The OB US report was generated in the AS system, and faxed to the ordering physician.   This report is also available in [employer] and in the [employer]. See AS Obstetric US report.  US Ob Follow Up  03/08/2014   OBSTETRICAL ULTRASOUND: This exam was performed within a [facility]. The OB US report was generated in the AS system, and faxed to the ordering  physician.   This report is also available in [employer] and in the [employer]. See AS Obstetric US report.  US Renal  03/13/2014   CLINICAL DATA:  Thirty-three weeks pregnant. Rule out hydronephrosis  EXAM: RENAL/URINARY TRACT ULTRASOUND COMPLETE  COMPARISON:  Renal ultrasound 11/20/2013  FINDINGS: Right Kidney:  Length: 11.9 cm. Moderate hydronephrosis, progressive since the prior ultrasound. Negative for renal mass.  Left Kidney:  Length: 11.2 cm. Echogenicity within normal limits. No mass or hydronephrosis visualized.  Bladder:  Appears normal for degree of bladder distention.  IMPRESSION: Moderate right hydronephrosis, progressive since the prior study. No obstruction of the left kidney.   Electronically Signed   By: Franchot Gallo M.D.   On: 03/13/2014 03:59   US Abdomen Limited Ruq  03/11/2014   CLINICAL DATA:  Upper abdominal pain  EXAM: US ABDOMEN  LIMITED - RIGHT UPPER QUADRANT  COMPARISON:  None.  FINDINGS: Gallbladder:  Within the gallbladder, there are several small echogenic foci which shadow but do not show appreciable movement. There is no gallbladder wall thickening or pericholecystic fluid. No sonographic Murphy sign noted.  Common bile duct:  Diameter: 5 mm. There is no intrahepatic or extrahepatic biliary duct dilatation.  Liver:  No focal lesion identified. Within normal limits in parenchymal echogenicity.  IMPRESSION: Cholelithiasis.  Study otherwise unremarkable.   Electronically Signed   By: Lowella Grip M.D.   On: 03/11/2014 11:38    Assessment: Krista Vaughn is  28 y.o. [id number] at [redacted]w[redacted]d presents with Dizziness Resolved in MAU with oral hydration. UA negative for UTI, and denies symptoms of infection. UDS obtained and pending on discharge due to history of marijuana use and benzo use.   Plan: Vasovagal near syncope: Resolved. Standing/cooking clasical hx and resolved. - continue prenatal care as instructed - encouraged oral  hydration  Phill Myron 3/28/20155:37 PM  I spoke with and examined patient and agree with resident's note and plan of care.  Fredrik Rigger, MD OB Fellow 03/27/2014 5:51 PM

## 2014-03-27 NOTE — MAU Note (Signed)
Patient states she started feeling dizzy about one hour ago. States she had her aunt take her blood pressure and it was low. Denies pain, bleeding, leaking, nausea or vomiting. Reports good fetal movement.

## 2014-03-29 ENCOUNTER — Ambulatory Visit (INDEPENDENT_AMBULATORY_CARE_PROVIDER_SITE_OTHER): Payer: Medicaid Other | Admitting: *Deleted

## 2014-03-29 VITALS — BP 110/64

## 2014-03-29 DIAGNOSIS — O26643 Intrahepatic cholestasis of pregnancy, third trimester: Secondary | ICD-10-CM

## 2014-03-29 DIAGNOSIS — O26613 Liver and biliary tract disorders in pregnancy, third trimester: Principal | ICD-10-CM

## 2014-03-29 DIAGNOSIS — K838 Other specified diseases of biliary tract: Secondary | ICD-10-CM

## 2014-03-29 DIAGNOSIS — O26619 Liver and biliary tract disorders in pregnancy, unspecified trimester: Secondary | ICD-10-CM

## 2014-03-29 DIAGNOSIS — K831 Obstruction of bile duct: Secondary | ICD-10-CM

## 2014-03-29 LAB — US OB FOLLOW UP

## 2014-03-29 NOTE — Progress Notes (Signed)
P=88 

## 2014-03-31 ENCOUNTER — Other Ambulatory Visit: Payer: Self-pay | Admitting: Obstetrics & Gynecology

## 2014-03-31 ENCOUNTER — Encounter: Payer: Self-pay | Admitting: Obstetrics & Gynecology

## 2014-03-31 DIAGNOSIS — A048 Other specified bacterial intestinal infections: Secondary | ICD-10-CM

## 2014-03-31 NOTE — Progress Notes (Addendum)
Needs stool tet for H Pylori (IgG positive) 4/1  1220  Called pt and left message to return my call this afternoon so that I may discuss test results with her prior to her appt in clinic tomorrow.  Diane Day RNC

## 2014-04-01 ENCOUNTER — Ambulatory Visit (INDEPENDENT_AMBULATORY_CARE_PROVIDER_SITE_OTHER): Payer: Medicaid Other | Admitting: Family Medicine

## 2014-04-01 VITALS — BP 111/57 | Wt 138.2 lb

## 2014-04-01 DIAGNOSIS — O26619 Liver and biliary tract disorders in pregnancy, unspecified trimester: Secondary | ICD-10-CM

## 2014-04-01 LAB — POCT URINALYSIS DIP (DEVICE)
Bilirubin Urine: NEGATIVE
GLUCOSE, UA: NEGATIVE mg/dL
HGB URINE DIPSTICK: NEGATIVE
Ketones, ur: NEGATIVE mg/dL
NITRITE: NEGATIVE
PROTEIN: NEGATIVE mg/dL
Specific Gravity, Urine: 1.015 (ref 1.005–1.030)
UROBILINOGEN UA: 0.2 mg/dL (ref 0.0–1.0)
pH: 7 (ref 5.0–8.0)

## 2014-04-01 NOTE — Progress Notes (Signed)
NST reviewed and reactive. Stool collection specimen container given. For IOL on Saturday

## 2014-04-01 NOTE — Progress Notes (Signed)
P=68,

## 2014-04-03 ENCOUNTER — Inpatient Hospital Stay (HOSPITAL_COMMUNITY): Payer: Medicaid Other | Admitting: Anesthesiology

## 2014-04-03 ENCOUNTER — Encounter (HOSPITAL_COMMUNITY): Payer: Self-pay

## 2014-04-03 ENCOUNTER — Encounter (HOSPITAL_COMMUNITY): Payer: Medicaid Other | Admitting: Anesthesiology

## 2014-04-03 ENCOUNTER — Inpatient Hospital Stay (HOSPITAL_COMMUNITY)
Admission: RE | Admit: 2014-04-03 | Discharge: 2014-04-06 | DRG: 775 | Disposition: A | Discharge status: Home / Self Care | Payer: Medicaid Other | Source: Ambulatory Visit | Attending: Family Medicine | Admitting: Family Medicine

## 2014-04-03 DIAGNOSIS — O99344 Other mental disorders complicating childbirth: Secondary | ICD-10-CM | POA: Diagnosis present

## 2014-04-03 DIAGNOSIS — K209 Esophagitis, unspecified without bleeding: Secondary | ICD-10-CM

## 2014-04-03 DIAGNOSIS — K219 Gastro-esophageal reflux disease without esophagitis: Secondary | ICD-10-CM | POA: Diagnosis present

## 2014-04-03 DIAGNOSIS — O26613 Liver and biliary tract disorders in pregnancy, third trimester: Secondary | ICD-10-CM

## 2014-04-03 DIAGNOSIS — Z22330 Carrier of Group B streptococcus: Secondary | ICD-10-CM

## 2014-04-03 DIAGNOSIS — A599 Trichomoniasis, unspecified: Secondary | ICD-10-CM

## 2014-04-03 DIAGNOSIS — D649 Anemia, unspecified: Secondary | ICD-10-CM | POA: Diagnosis present

## 2014-04-03 DIAGNOSIS — O35EXX0 Maternal care for other (suspected) fetal abnormality and damage, fetal genitourinary anomalies, not applicable or unspecified: Secondary | ICD-10-CM

## 2014-04-03 DIAGNOSIS — O9989 Other specified diseases and conditions complicating pregnancy, childbirth and the puerperium: Secondary | ICD-10-CM

## 2014-04-03 DIAGNOSIS — O358XX0 Maternal care for other (suspected) fetal abnormality and damage, not applicable or unspecified: Secondary | ICD-10-CM

## 2014-04-03 DIAGNOSIS — F3289 Other specified depressive episodes: Secondary | ICD-10-CM | POA: Diagnosis present

## 2014-04-03 DIAGNOSIS — O9902 Anemia complicating childbirth: Secondary | ICD-10-CM | POA: Diagnosis present

## 2014-04-03 DIAGNOSIS — O0932 Supervision of pregnancy with insufficient antenatal care, second trimester: Secondary | ICD-10-CM

## 2014-04-03 DIAGNOSIS — K838 Other specified diseases of biliary tract: Secondary | ICD-10-CM | POA: Diagnosis present

## 2014-04-03 DIAGNOSIS — F329 Major depressive disorder, single episode, unspecified: Secondary | ICD-10-CM | POA: Diagnosis present

## 2014-04-03 DIAGNOSIS — Z87891 Personal history of nicotine dependence: Secondary | ICD-10-CM

## 2014-04-03 DIAGNOSIS — O26619 Liver and biliary tract disorders in pregnancy, unspecified trimester: Principal | ICD-10-CM | POA: Diagnosis present

## 2014-04-03 DIAGNOSIS — F1210 Cannabis abuse, uncomplicated: Secondary | ICD-10-CM | POA: Diagnosis present

## 2014-04-03 DIAGNOSIS — O26643 Intrahepatic cholestasis of pregnancy, third trimester: Secondary | ICD-10-CM

## 2014-04-03 DIAGNOSIS — K831 Obstruction of bile duct: Secondary | ICD-10-CM

## 2014-04-03 DIAGNOSIS — O99892 Other specified diseases and conditions complicating childbirth: Secondary | ICD-10-CM | POA: Diagnosis present

## 2014-04-03 LAB — CBC
HCT: 27.8 % — ABNORMAL LOW (ref 36.0–46.0)
Hemoglobin: 8.6 g/dL — ABNORMAL LOW (ref 12.0–15.0)
MCH: 25.4 pg — AB (ref 26.0–34.0)
MCHC: 30.9 g/dL (ref 30.0–36.0)
MCV: 82 fL (ref 78.0–100.0)
Platelets: 284 10*3/uL (ref 150–400)
RBC: 3.39 MIL/uL — AB (ref 3.87–5.11)
RDW: 14.8 % (ref 11.5–15.5)
WBC: 7.7 10*3/uL (ref 4.0–10.5)

## 2014-04-03 LAB — TYPE AND SCREEN
ABO/RH(D): O POS
ANTIBODY SCREEN: NEGATIVE

## 2014-04-03 LAB — RPR: RPR: REACTIVE — AB

## 2014-04-03 LAB — RPR TITER

## 2014-04-03 MED ORDER — PHENYLEPHRINE 40 MCG/ML (10ML) SYRINGE FOR IV PUSH (FOR BLOOD PRESSURE SUPPORT)
80.0000 ug | PREFILLED_SYRINGE | INTRAVENOUS | Status: DC | PRN
  Filled 2014-04-03: qty 2

## 2014-04-03 MED ORDER — OXYTOCIN 40 UNITS IN LACTATED RINGERS INFUSION - SIMPLE MED
0.0000 m[IU]/min | INTRAVENOUS | Status: DC
  Administered 2014-04-03: 2 m[IU]/min via INTRAVENOUS
  Filled 2014-04-03: qty 1000

## 2014-04-03 MED ORDER — DIPHENHYDRAMINE HCL 50 MG/ML IJ SOLN: 12.5000 mg | INTRAMUSCULAR | Status: DC | PRN

## 2014-04-03 MED ORDER — OXYTOCIN 40 UNITS IN LACTATED RINGERS INFUSION - SIMPLE MED: 62.5000 mL/h | INTRAVENOUS | Status: DC

## 2014-04-03 MED ORDER — LACTATED RINGERS IV SOLN: 500.0000 mL | Freq: Once | INTRAVENOUS | Status: DC

## 2014-04-03 MED ORDER — FAMOTIDINE IN NACL 20-0.9 MG/50ML-% IV SOLN
20.0000 mg | Freq: Two times a day (BID) | INTRAVENOUS | Status: DC
  Administered 2014-04-03: 20 mg via INTRAVENOUS
  Filled 2014-04-03 (×5): qty 50

## 2014-04-03 MED ORDER — EPHEDRINE 5 MG/ML INJ
10.0000 mg | INTRAVENOUS | Status: DC | PRN
  Filled 2014-04-03: qty 4
  Filled 2014-04-03: qty 2

## 2014-04-03 MED ORDER — FENTANYL 2.5 MCG/ML BUPIVACAINE 1/10 % EPIDURAL INFUSION (WH - ANES)
14.0000 mL/h | INTRAMUSCULAR | Status: DC | PRN
  Administered 2014-04-03: 14 mL/h via EPIDURAL
  Filled 2014-04-03: qty 125

## 2014-04-03 MED ORDER — CITRIC ACID-SODIUM CITRATE 334-500 MG/5ML PO SOLN
30.0000 mL | ORAL | Status: DC | PRN
  Administered 2014-04-03: 30 mL via ORAL
  Filled 2014-04-03 (×2): qty 15

## 2014-04-03 MED ORDER — LACTATED RINGERS IV SOLN: 500.0000 mL | INTRAVENOUS | Status: DC | PRN

## 2014-04-03 MED ORDER — PROMETHAZINE HCL 25 MG PO TABS: 25.0000 mg | ORAL_TABLET | Freq: Four times a day (QID) | ORAL | Status: DC | PRN

## 2014-04-03 MED ORDER — ACETAMINOPHEN 325 MG PO TABS
650.0000 mg | ORAL_TABLET | ORAL | Status: DC | PRN
  Filled 2014-04-03: qty 2

## 2014-04-03 MED ORDER — PENICILLIN G POTASSIUM 5000000 UNITS IJ SOLR
2.5000 10*6.[IU] | INTRAVENOUS | Status: DC
  Administered 2014-04-03 – 2014-04-04 (×3): 2.5 10*6.[IU] via INTRAVENOUS
  Filled 2014-04-03 (×11): qty 2.5

## 2014-04-03 MED ORDER — OXYCODONE-ACETAMINOPHEN 5-325 MG PO TABS
1.0000 | ORAL_TABLET | ORAL | Status: DC | PRN
  Administered 2014-04-04 (×2): 1 via ORAL
  Filled 2014-04-03 (×2): qty 1

## 2014-04-03 MED ORDER — PROMETHAZINE HCL 25 MG/ML IJ SOLN
12.5000 mg | Freq: Four times a day (QID) | INTRAMUSCULAR | Status: DC | PRN
  Administered 2014-04-03: 22:00:00 via INTRAVENOUS
  Filled 2014-04-03: qty 1

## 2014-04-03 MED ORDER — LIDOCAINE HCL (PF) 1 % IJ SOLN
INTRAMUSCULAR | Status: DC | PRN
  Administered 2014-04-03 (×4): 4 mL

## 2014-04-03 MED ORDER — LACTATED RINGERS IV SOLN
INTRAVENOUS | Status: DC
  Administered 2014-04-03 – 2014-04-04 (×2): via INTRAVENOUS

## 2014-04-03 MED ORDER — EPHEDRINE 5 MG/ML INJ
10.0000 mg | INTRAVENOUS | Status: DC | PRN
  Filled 2014-04-03: qty 2

## 2014-04-03 MED ORDER — TERBUTALINE SULFATE 1 MG/ML IJ SOLN: 0.2500 mg | Freq: Once | INTRAMUSCULAR | Status: AC | PRN

## 2014-04-03 MED ORDER — FENTANYL CITRATE 0.05 MG/ML IJ SOLN
100.0000 ug | INTRAMUSCULAR | Status: DC | PRN
  Administered 2014-04-03 (×5): 100 ug via INTRAVENOUS
  Filled 2014-04-03 (×5): qty 2

## 2014-04-03 MED ORDER — PHENYLEPHRINE 40 MCG/ML (10ML) SYRINGE FOR IV PUSH (FOR BLOOD PRESSURE SUPPORT)
80.0000 ug | PREFILLED_SYRINGE | INTRAVENOUS | Status: DC | PRN
  Filled 2014-04-03: qty 10
  Filled 2014-04-03: qty 2

## 2014-04-03 MED ORDER — IBUPROFEN 600 MG PO TABS
600.0000 mg | ORAL_TABLET | Freq: Four times a day (QID) | ORAL | Status: DC | PRN
  Administered 2014-04-04 (×2): 600 mg via ORAL

## 2014-04-03 MED ORDER — MISOPROSTOL 200 MCG PO TABS
50.0000 ug | ORAL_TABLET | ORAL | Status: DC
  Administered 2014-04-03 (×2): 50 ug via ORAL
  Filled 2014-04-03: qty 1
  Filled 2014-04-03 (×3): qty 0.5

## 2014-04-03 MED ORDER — URSODIOL 300 MG PO CAPS
300.0000 mg | ORAL_CAPSULE | Freq: Two times a day (BID) | ORAL | Status: DC
  Administered 2014-04-03 – 2014-04-06 (×6): 300 mg via ORAL
  Filled 2014-04-03 (×8): qty 1

## 2014-04-03 MED ORDER — OXYTOCIN BOLUS FROM INFUSION
500.0000 mL | INTRAVENOUS | Status: DC
  Administered 2014-04-04: 500 mL via INTRAVENOUS

## 2014-04-03 MED ORDER — LIDOCAINE HCL (PF) 1 % IJ SOLN
30.0000 mL | INTRAMUSCULAR | Status: DC | PRN
  Administered 2014-04-04: 30 mL via SUBCUTANEOUS
  Filled 2014-04-03: qty 30

## 2014-04-03 MED ORDER — ONDANSETRON HCL 4 MG/2ML IJ SOLN
4.0000 mg | Freq: Four times a day (QID) | INTRAMUSCULAR | Status: DC | PRN
  Administered 2014-04-03 (×2): 4 mg via INTRAVENOUS
  Filled 2014-04-03 (×2): qty 2

## 2014-04-03 MED ORDER — PENICILLIN G POTASSIUM 5000000 UNITS IJ SOLR
5.0000 10*6.[IU] | Freq: Once | INTRAVENOUS | Status: AC
  Administered 2014-04-03: 5 10*6.[IU] via INTRAVENOUS
  Filled 2014-04-03: qty 5

## 2014-04-03 NOTE — Progress Notes (Signed)
Patient ID: Krista Vaughn, female   DOB: 02-24-1986, 28 y.o.   MRN: [id number] Krista Vaughn is a 28 y.o. [id number] at [redacted]w[redacted]d  admitted for induction of labor due to cholestasis of pregnancy.  Subjective: Nauseated, no pressure  Objective: BP 103/68  Pulse 68  Temp(Src) 97.5 F (36.4 C) (Axillary)  Resp 18  Ht 5' (1.524 m)  Wt 62.596 kg (138 lb)  BMI 26.95 kg/m2  SpO2 100%  LMP 06/19/2013    FHT:  FHR: 115 bpm, variability: moderate,  accelerations:  Present,  decelerations:  Present variables UC:   regular, every 2-4 minutes  SVE:   Dilation: 6.5 Effacement (%): 60 Station: -2 Exam by:: Doree Fudge, CNM AROM bulging bag, small amount clear fluid Scant vb  Labs: Lab Results  Component Value Date   WBC 7.7 04/03/2014   HGB 8.6* 04/03/2014   HCT 27.8* 04/03/2014   MCV 82.0 04/03/2014   PLT 284 04/03/2014    Assessment / Plan: IOL d/t cholestasis of pregnancy, contracting on own s/p foley bulb w/ presumed partial marginal abruption but w/o cervical change. Now AROM'd.  Labor: no change, now AROM'd, will assess for need for pitocin Fetal Wellbeing:  Category II Pain Control:  Epidural Pre-eclampsia: n/a I/D:  PCN for gbs+ Anticipated MOD:  NSVD  Tawnya Crook CNM, WHNP-BC 04/03/2014, 10:03 PM

## 2014-04-03 NOTE — Anesthesia Procedure Notes (Signed)
Epidural Patient location during procedure: OB Start time: 04/03/2014 7:15 PM  Staffing Performed by: anesthesiologist   Preanesthetic Checklist Completed: patient identified, site marked, surgical consent, pre-op evaluation, timeout performed, IV checked, risks and benefits discussed and monitors and equipment checked  Epidural Patient position: sitting Prep: site prepped and draped and DuraPrep Patient monitoring: continuous pulse ox and blood pressure Approach: midline Injection technique: LOR air  Needle:  Needle type: Tuohy  Needle gauge: 17 G Needle length: 9 cm and 9 Needle insertion depth: 5 cm cm Catheter type: closed end flexible Catheter size: 19 Gauge Catheter at skin depth: 10 cm Test dose: negative  Assessment Events: blood not aspirated, injection not painful, no injection resistance, negative IV test and no paresthesia  Additional Notes Discussed risk of headache, infection, bleeding, nerve injury and failed or incomplete block.  Patient voices understanding and wishes to proceed.  Epidural placed easily on first attempt.  No paresthesia.  Patient tolerated procedure well with no apparent complications.  Charlton Haws, MDReason for block:procedure for pain

## 2014-04-03 NOTE — Progress Notes (Signed)
Krista Vaughn is a 28 y.o. [id number] at [redacted]w[redacted]d by admitted for induction of labor due to cholestasis.  Subjective: Patient presented for IOL for cholestasis.    Objective: BP 104/64  Pulse 94  Temp(Src) 96.5 F (35.8 C) (Axillary)  Resp 18  Ht 5' (1.524 m)  Wt 62.596 kg (138 lb)  BMI 26.95 kg/m2  LMP 06/19/2013      FHT:  FHR: 140 bpm, variability: moderate,  accelerations:  Present,  decelerations:  Absent UC:   none SVE:   Dilation: 1.5 Effacement (%): Thick Station: -2 Exam by:: Dherr rn  Labs: Lab Results  Component Value Date   WBC 7.7 04/03/2014   HGB 8.6* 04/03/2014   HCT 27.8* 04/03/2014   MCV 82.0 04/03/2014   PLT 284 04/03/2014    Assessment / Plan: Induction for labor for cholestasis. Start induction with cytotec. Patient is suspected of having H Pylor.  Was supposed to bring stool sample, but states she has not had a bowel movement.  Labor: Induction with cytotec x 1 Preeclampsia:  no signs or symptoms of toxicity Fetal Wellbeing:  Category I Pain Control:  Labor support without medications I/D:  GBS +, PCN Anticipated MOD:  NSVD  Ivin Booty 04/03/2014, 9:31 AM

## 2014-04-03 NOTE — Progress Notes (Signed)
I spoke with and examined patient and agree with resident/PA/SNM's note and plan of care, with the exception of- pt not on pitocin as stated in note.  Roma Schanz, CNM, [facility] 04/03/2014 10:33 PM

## 2014-04-03 NOTE — Progress Notes (Signed)
Lab called + RPR, 1 to 1 titer

## 2014-04-03 NOTE — Progress Notes (Signed)
Knute Neu cnm in room discussing positive RPR results

## 2014-04-03 NOTE — Anesthesia Preprocedure Evaluation (Signed)
Anesthesia Evaluation  Patient identified by MRN, date of birth, ID band Patient awake    Reviewed: Allergy & Precautions, H&P , NPO status , Patient's Chart, lab work & pertinent test results, reviewed documented beta blocker date and time   History of Anesthesia Complications Negative for: history of anesthetic complications  Airway Mallampati: III TM Distance: >3 FB Neck ROM: full    Dental  (+) Teeth Intact   Pulmonary neg pulmonary ROS, former smoker,  breath sounds clear to auscultation        Cardiovascular negative cardio ROS  Rhythm:regular Rate:Normal     Neuro/Psych  Headaches, PSYCHIATRIC DISORDERS (depression, anxiety)    GI/Hepatic GERD-  Medicated,(+)       marijuana use, Cholestasis Esophagitis - taking dilaudid   Endo/Other  negative endocrine ROS  Renal/GU negative Renal ROS     Musculoskeletal   Abdominal   Peds  Hematology  (+) anemia , hgb 8.6   Anesthesia Other Findings   Reproductive/Obstetrics (+) Pregnancy                           Anesthesia Physical Anesthesia Plan  ASA: II  Anesthesia Plan: Epidural   Post-op Pain Management:    Induction:   Airway Management Planned:   Additional Equipment:   Intra-op Plan:   Post-operative Plan:   Informed Consent: I have reviewed the patients History and Physical, chart, labs and discussed the procedure including the risks, benefits and alternatives for the proposed anesthesia with the patient or authorized representative who has indicated his/her understanding and acceptance.     Plan Discussed with:   Anesthesia Plan Comments:         Anesthesia Quick Evaluation

## 2014-04-03 NOTE — Progress Notes (Signed)
I spoke with and examined patient and agree with resident/PA/SNM's note and plan of care.  Roma Schanz, CNM, [facility] 04/03/2014 12:32 PM

## 2014-04-03 NOTE — Progress Notes (Signed)
I spoke with and examined patient and agree with resident/PA/SNM's note and plan of care.  Notified pt of +RPR w/ low RPR titer 1:1. Pt denies h/o or tx for syphilis. Awaiting Treponemal test for definitive dx, likely will not be back until tomorrow. Possible false + d/t low titer. Discussed w/ Dr. Kennon Rounds, no change in current management.   Roma Schanz, CNM, [facility] 04/03/2014 5:43 PM

## 2014-04-03 NOTE — Progress Notes (Signed)
Krista Vaughn is a 28 y.o. [id number] at [redacted]w[redacted]d by admitted for induction of labor due to cholestasis.  Subjective: Patient feels contractions but better with fentanyl.  Objective: BP 110/66  Pulse 72  Temp(Src) 98.1 F (36.7 C) (Oral)  Resp 18  Ht 5' (1.524 m)  Wt 62.596 kg (138 lb)  BMI 26.95 kg/m2  SpO2 99%  LMP 06/19/2013      FHT:  FHR: 120 bpm, variability: moderate,  accelerations:  Present,  decelerations:  Absent UC:   sporadic SVE:   Dilation: 2 Effacement (%): Thick Station: -2 Exam by:: Dherr rn  Labs: Lab Results  Component Value Date   WBC 7.7 04/03/2014   HGB 8.6* 04/03/2014   HCT 27.8* 04/03/2014   MCV 82.0 04/03/2014   PLT 284 04/03/2014    Assessment / Plan: Induction for labor for cholestasis. Continue induction with foley bulb.   Labor: Cervix has softened.  Insert foley bulb.  Pt tolerated procedure well. Preeclampsia:  N/A Fetal Wellbeing:  Category I Pain Control:  Labor support without medications I/D:  GBS +, PCN Anticipated MOD:  NSVD  Krista Vaughn 04/03/2014, 5:14 PM

## 2014-04-03 NOTE — Progress Notes (Signed)
Krista Vaughn is a 28 y.o. [id number] at [redacted]w[redacted]d by admitted for induction of labor due to cholestasis.  Subjective: Patient is feeling contractions but wants to wait for epidural.  She is tolerating contractions well.  Objective: BP 110/66  Pulse 80  Temp(Src) 98.1 F (36.7 C) (Oral)  Resp 18  Ht 5' (1.524 m)  Wt 62.596 kg (138 lb)  BMI 26.95 kg/m2  LMP 06/19/2013      FHT:  FHR: 115 bpm, variability: moderate,  accelerations:  Present,  decelerations:  Absent UC:   none SVE:   Dilation: 1.5 Effacement (%): Thick Station: -3 Exam by:: TBraimah, SNM  Labs: Lab Results  Component Value Date   WBC 7.7 04/03/2014   HGB 8.6* 04/03/2014   HCT 27.8* 04/03/2014   MCV 82.0 04/03/2014   PLT 284 04/03/2014    Assessment / Plan: Induction for labor for cholestasis. Continue induction with cytotec.  Labor: Induction with cytotec x 2 Preeclampsia:  N/A Fetal Wellbeing:  Category I Pain Control:  Labor support without medications I/D:  GBS +, PCN Anticipated MOD:  NSVD  Ivin Booty 04/03/2014, 12:56 PM

## 2014-04-03 NOTE — Progress Notes (Signed)
Krista Vaughn is a 28 y.o. [id number] at [redacted]w[redacted]d by admitted for induction of labor due to cholestasis.  Subjective: Pt in severe pain.  Requests epidural.  Objective: BP 110/66  Pulse 72  Temp(Src) 98.1 F (36.7 C) (Oral)  Resp 20  Ht 5' (1.524 m)  Wt 62.596 kg (138 lb)  BMI 26.95 kg/m2  SpO2 99%  LMP 06/19/2013      FHT:  FHR: 120 bpm, variability: moderate,  accelerations:  Present,  decelerations:  Absent UC:   sporadic SVE:   Dilation: 6.5 Effacement (%): 60 Station: -2 Exam by:: kim booker cnm  Labs: Lab Results  Component Value Date   WBC 7.7 04/03/2014   HGB 8.6* 04/03/2014   HCT 27.8* 04/03/2014   MCV 82.0 04/03/2014   PLT 284 04/03/2014    Assessment / Plan: Induction for labor for cholestasis. Foley bulb is out.  Patient expelled 200 of blood and clots.  Presumed partial abruption.  Baby and mom stable.  Continue to monitor and increase pitocin.  Epidural ordered. Dr. Kennon Rounds notified.   Labor: Foley bulb out.  Patient progressing. Preeclampsia:  N/A Fetal Wellbeing:  Category I Pain Control:  Epidural I/D:  GBS +, PCN Anticipated MOD:  NSVD  Ivin Booty 04/03/2014, 7:02 PM

## 2014-04-03 NOTE — Progress Notes (Signed)
Foley bulb inserted into cervix by Otila Kluver SNM

## 2014-04-03 NOTE — H&P (Signed)
Krista Vaughn is a 28 y.o. female [phone] with IUP at [redacted]w[redacted]d presenting for induction of labor for cholestasis. Pt states she has been having occassional contractions, associated with none vaginal bleeding.  Membranes are intact, with active fetal movement.  [facility]ks  Prenatal History/Complications:  Past Medical History: Past Medical History  Diagnosis Date  . Chlamydia   . Bronchitis   . Depression   . Anxiety   . Migraine   . Cholecystitis     Past Surgical History: Past Surgical History  Procedure Laterality Date  . Induced abortion    . Therapeutic abortion      Obstetrical History: OB History   Grav Para Term Preterm Abortions TAB SAB Ect Mult Living   6 2 2  3 3  0   2      Gynecological History: OB History   Grav Para Term Preterm Abortions TAB SAB Ect Mult Living   6 2 2  3 3  0   2      Social History: History   Social History  . Marital Status: Single    Spouse Name: N/A    Number of Children: N/A  . Years of Education: N/A   Social History Main Topics  . Smoking status: Former Smoker    Types: Cigarettes    Quit date: 01/08/2014  . Smokeless tobacco: Never Used  . Alcohol Use: No  . Drug Use: No  . Sexual Activity: Yes    Birth Control/ Protection: None   Other Topics Concern  . None   Social History Narrative  . None    Family History: Family History  Problem Relation Age of Onset  . Down syndrome Sister     Allergies: Allergies  Allergen Reactions  . Ambien [Zolpidem] Other (See Comments)    Pt reports intolerance due to dizziness; prefers not to take.   . Decadron [Dexamethasone] Other (See Comments)    Makes her skin crawl    Facility-administered medications prior to admission  Medication Dose Route Frequency Provider Last Rate Last Dose  . Tdap (BOOSTRIX) injection 0.5 mL  0.5 mL Intramuscular Once Guss Bunde, MD       Prescriptions prior to admission  Medication Sig Dispense  Refill  . diphenhydrAMINE (BENADRYL) 25 MG tablet Take 25 mg by mouth as needed for sleep.      Marland Kitchen HYDROmorphone (DILAUDID) 2 MG tablet Take 1 tablet (2 mg total) by mouth every 6 (six) hours as needed for severe pain.  30 tablet  0  . pantoprazole (PROTONIX) 40 MG tablet Take 1 tablet (40 mg total) by mouth 2 (two) times daily.  60 tablet  3  . Prenatal Vit-Min-FA-Fish Oil ([employer] PRENATAL GUMMY PO) Take 1 each by mouth daily.      . ursodiol (ACTIGALL) 300 MG capsule Take 1 capsule (300 mg total) by mouth 2 (two) times daily.  90 capsule  1     Prenatal Transfer Tool  Maternal Diabetes: No Genetic Screening: Declined, too late Maternal Ultrasounds/Referrals: Normal Fetal Ultrasounds or other Referrals:  None Maternal Substance Abuse:  Yes:  Type: Marijuana Significant Maternal Medications:  None Significant Maternal Lab Results: Lab values include: Group B Strep positive     Review of Systems   Constitutional: Negative for fever, chills, weight loss, malaise/fatigue and diaphoresis.  HENT: Negative for hearing loss, ear pain, nosebleeds, congestion, sore throat, neck pain, tinnitus and ear discharge.   Eyes: Negative for blurred  vision, double vision, photophobia, pain, discharge and redness.  Respiratory: Negative for cough, hemoptysis, sputum production, shortness of breath, wheezing and stridor.   Cardiovascular: Negative for chest pain, palpitations, orthopnea,  leg swelling  Gastrointestinal: Positive for abdominal pain. Negative for heartburn, nausea, vomiting, diarrhea, constipation, blood in stool Genitourinary: Negative for dysuria, urgency, frequency, hematuria and flank pain.  Musculoskeletal: Negative for myalgias, back pain, joint pain and falls.  Skin: Negative for itching and rash.  Neurological: Negative for dizziness, tingling, tremors, sensory change, speech change, focal weakness, seizures, loss of consciousness, weakness and headaches.  Endo/Heme/Allergies: Negative  for environmental allergies and polydipsia. Does not bruise/bleed easily.  Psychiatric/Behavioral: Negative for depression, suicidal ideas, hallucinations, memory loss and substance abuse. The patient is not nervous/anxious and does not have insomnia.       Blood pressure 104/64, pulse 94, temperature 96.5 F (35.8 C), temperature source Axillary, resp. rate 18, height 5' (1.524 m), weight 62.596 kg (138 lb), last menstrual period 06/19/2013. General appearance: alert, cooperative and no distress Lungs: clear to auscultation bilaterally Heart: regular rate and rhythm Abdomen: soft, non-tender; bowel sounds normal Pelvic: SVE below Extremities: Homans sign is negative, no sign of DVT Presentation: cephalic Fetal monitoring: Baseline 135, Moderate Variability, Accels present, No decels Uterine activity: Occassional contractions Dilation: 1.5 Effacement (%): Thick Station: -2 Exam by:: Dherr rn   Prenatal labs: ABO, Rh: O/POS/-- (11/19 1028) Antibody: NEG (11/19 1028) Rubella:   RPR: NON REAC (01/27 1048)  HBsAg: NEGATIVE (11/19 1028)  HIV: NON REACTIVE (01/27 1048)  GBS: Positive (03/19 0000)  1 hr Glucola 100 Genetic screening  Too late Anatomy US normal   Results for orders placed during the hospital encounter of 04/03/14 (from the past 24 hour(s))  CBC   Collection Time    04/03/14  7:35 AM      Result Value Ref Range   WBC 7.7  4.0 - 10.5 K/uL   RBC 3.39 (*) 3.87 - 5.11 MIL/uL   Hemoglobin 8.6 (*) 12.0 - 15.0 g/dL   HCT 27.8 (*) 36.0 - 46.0 %   MCV 82.0  78.0 - 100.0 fL   MCH 25.4 (*) 26.0 - 34.0 pg   MCHC 30.9  30.0 - 36.0 g/dL   RDW 14.8  11.5 - 15.5 %   Platelets 284  150 - 400 K/uL    Assessment: Krista Vaughn is a 28 y.o. [phone] with an IUP at [redacted]w[redacted]d presenting for induction of labor for cholestasis  Plan: Admit to birthing suites Induction of labor  GBS Prophylaxis Anticipate NSVD   Ivin Booty 04/03/2014, 9:56 AM   I spoke with and  examined patient and agree with resident/PA/SNM's note and plan of care.  Roma Schanz, CNM, [facility] 04/03/2014 12:33 PM

## 2014-04-03 NOTE — Progress Notes (Signed)
I spoke with and examined patient and agree with resident/PA/SNM's note and plan of care.  Cat I FHR. No cervical change s/p po cytotec x 1, will repeat w/ hopes of placing cervical foley bulb at next check. Roma Schanz, CNM, WHNP-BC 04/03/2014 1:17 PM

## 2014-04-03 NOTE — H&P (Signed)
Attestation of Attending Supervision of Advanced Practitioner (PA/CNM/NP): Evaluation and management procedures were performed by the Advanced Practitioner under my supervision and collaboration.  I have reviewed the Advanced Practitioner's note and chart, and I agree with the management and plan.  Donnamae Jude, MD [facility] Attending 04/03/2014 1:27 PM

## 2014-04-03 NOTE — Progress Notes (Signed)
DR Dorna Leitz aware of + RPR results

## 2014-04-04 ENCOUNTER — Encounter (HOSPITAL_COMMUNITY): Payer: Self-pay

## 2014-04-04 DIAGNOSIS — O26619 Liver and biliary tract disorders in pregnancy, unspecified trimester: Secondary | ICD-10-CM

## 2014-04-04 DIAGNOSIS — K838 Other specified diseases of biliary tract: Secondary | ICD-10-CM

## 2014-04-04 DIAGNOSIS — O99344 Other mental disorders complicating childbirth: Secondary | ICD-10-CM

## 2014-04-04 LAB — CBC
HEMATOCRIT: 26.1 % — AB (ref 36.0–46.0)
HEMOGLOBIN: 8.1 g/dL — AB (ref 12.0–15.0)
MCH: 25.5 pg — AB (ref 26.0–34.0)
MCHC: 31 g/dL (ref 30.0–36.0)
MCV: 82.1 fL (ref 78.0–100.0)
Platelets: 228 10*3/uL (ref 150–400)
RBC: 3.18 MIL/uL — AB (ref 3.87–5.11)
RDW: 14.6 % (ref 11.5–15.5)
WBC: 12.2 10*3/uL — ABNORMAL HIGH (ref 4.0–10.5)

## 2014-04-04 MED ORDER — BENZOCAINE-MENTHOL 20-0.5 % EX AERO
1.0000 | INHALATION_SPRAY | CUTANEOUS | Status: DC | PRN
Start: 2014-04-04 — End: 2014-04-06
  Administered 2014-04-04: 1 via TOPICAL
  Filled 2014-04-04: qty 56

## 2014-04-04 MED ORDER — MENTHOL 3 MG MT LOZG
1.0000 | LOZENGE | OROMUCOSAL | Status: DC | PRN
  Filled 2014-04-04: qty 9

## 2014-04-04 MED ORDER — ONDANSETRON HCL 4 MG PO TABS: 4.0000 mg | ORAL_TABLET | ORAL | Status: DC | PRN

## 2014-04-04 MED ORDER — PRENATAL MULTIVITAMIN CH
1.0000 | ORAL_TABLET | Freq: Every day | ORAL | Status: DC
  Administered 2014-04-04 – 2014-04-05 (×2): 1 via ORAL
  Filled 2014-04-04 (×2): qty 1

## 2014-04-04 MED ORDER — OXYCODONE-ACETAMINOPHEN 5-325 MG PO TABS
1.0000 | ORAL_TABLET | ORAL | Status: DC | PRN
  Administered 2014-04-04 – 2014-04-05 (×4): 1 via ORAL
  Administered 2014-04-05: 2 via ORAL
  Administered 2014-04-05: 1 via ORAL
  Administered 2014-04-06 (×2): 2 via ORAL
  Filled 2014-04-04 (×4): qty 1
  Filled 2014-04-04: qty 2
  Filled 2014-04-04: qty 1
  Filled 2014-04-04 (×3): qty 2
  Filled 2014-04-04: qty 1

## 2014-04-04 MED ORDER — PNEUMOCOCCAL VAC POLYVALENT 25 MCG/0.5ML IJ INJ
0.5000 mL | INJECTION | INTRAMUSCULAR | Status: AC
  Administered 2014-04-05: 0.5 mL via INTRAMUSCULAR
  Filled 2014-04-04: qty 0.5

## 2014-04-04 MED ORDER — ONDANSETRON HCL 4 MG/2ML IJ SOLN: 4.0000 mg | INTRAMUSCULAR | Status: DC | PRN

## 2014-04-04 MED ORDER — ZOLPIDEM TARTRATE 5 MG PO TABS: 5.0000 mg | ORAL_TABLET | Freq: Every evening | ORAL | Status: DC | PRN

## 2014-04-04 MED ORDER — IBUPROFEN 600 MG PO TABS
600.0000 mg | ORAL_TABLET | Freq: Four times a day (QID) | ORAL | Status: DC
  Administered 2014-04-04 – 2014-04-06 (×7): 600 mg via ORAL
  Filled 2014-04-04 (×9): qty 1

## 2014-04-04 MED ORDER — DIBUCAINE 1 % RE OINT: 1.0000 "application " | TOPICAL_OINTMENT | RECTAL | Status: DC | PRN

## 2014-04-04 MED ORDER — FERROUS SULFATE 325 (65 FE) MG PO TABS
325.0000 mg | ORAL_TABLET | Freq: Two times a day (BID) | ORAL | Status: DC
  Administered 2014-04-04 – 2014-04-06 (×4): 325 mg via ORAL
  Filled 2014-04-04 (×4): qty 1

## 2014-04-04 MED ORDER — LANOLIN HYDROUS EX OINT: TOPICAL_OINTMENT | CUTANEOUS | Status: DC | PRN

## 2014-04-04 MED ORDER — DIPHENHYDRAMINE HCL 25 MG PO CAPS: 25.0000 mg | ORAL_CAPSULE | Freq: Four times a day (QID) | ORAL | Status: DC | PRN

## 2014-04-04 MED ORDER — WITCH HAZEL-GLYCERIN EX PADS
1.0000 | MEDICATED_PAD | CUTANEOUS | Status: DC | PRN
Start: 2014-04-04 — End: 2014-04-06

## 2014-04-04 MED ORDER — SIMETHICONE 80 MG PO CHEW: 80.0000 mg | CHEWABLE_TABLET | ORAL | Status: DC | PRN

## 2014-04-04 MED ORDER — SENNOSIDES-DOCUSATE SODIUM 8.6-50 MG PO TABS
2.0000 | ORAL_TABLET | ORAL | Status: DC
  Administered 2014-04-04 – 2014-04-05 (×2): 2 via ORAL
  Filled 2014-04-04 (×2): qty 2

## 2014-04-04 MED ORDER — TETANUS-DIPHTH-ACELL PERTUSSIS 5-2.5-18.5 LF-MCG/0.5 IM SUSP: 0.5000 mL | Freq: Once | INTRAMUSCULAR | Status: DC

## 2014-04-04 NOTE — Progress Notes (Signed)
Phone call to lab:  FTA-ABS testing will be done Monday, results anticipated by 3 pm . Pt made aware of likely result time;she is comfortable with this timeline. Pt has been told by Peds that baby will need to stay til Tuesday(+ GBS)

## 2014-04-04 NOTE — Progress Notes (Signed)
Placenta to path d/t feeling hot and w/ +foul odor at delivery. Mom was afebrile, no s/s chorioamnionitis.  Roma Schanz, CNM, [facility] 04/04/2014 2:13 AM

## 2014-04-04 NOTE — Progress Notes (Signed)
Clinical Social Work Department PSYCHOSOCIAL ASSESSMENT - MATERNAL/CHILD 04/04/2014  Patient:  Krista Vaughn, Krista Vaughn  Account Number:  [id number]  [place] Date:  04/03/2014  Ardine Eng Name:   Krista Vaughn    Clinical Social Worker:  Karia Ehresman, LCSW   Date/Time:  04/04/2014 03:00 PM  Date Referred:  04/03/2014   Referral source  Central Nursery     Referred reason  Depression/Anxiety  Substance Abuse   Other referral source:    I:  FAMILY / [place] legal guardian:  PARENT  Guardian - Name Guardian - Age [place] [address], [place] [phone]   Other household support members/support persons Other support:    II  PSYCHOSOCIAL DATA Information Source:    Occupational hygienist Employment:   Mother is employed   Museum/gallery curator resources:  [employer] [place]:   Other  [place] / Grade:   Maternity Care Coordinator / Child Services Coordination / Early Interventions:  Cultural issues impacting care:    III  STRENGTHS Strengths  Supportive family/friends  Home prepared for Child (including basic supplies)  Adequate Resources   Strength comment:    IV  RISK FACTORS AND CURRENT PROBLEMS Current Problem:       V  SOCIAL WORK ASSESSMENT Acknowledged MD order for social work consult to assess mother's hx of substance abuse and mental illness. Mother's UDS was positive for marijuana on 03/27/14.  She is a single parent with 2 other dependents ages 13 and 61. Mother states that she was stressed during pregnancy because of her financial situation, and constantly sick. Informed that she was prescribed medication which she took only for a short period of time.  She stated that "the medication didn't help me like I thought it would". Informed that she used different strategies to lower her anxiety.  She admits to use of marijuana, but stated that she is not addicted to the drug".  She denies any  need for treatment.    Mother informed of the hospital's drug screen policy.  UDS on newborn was ordered.   She reports adequate support at home now that she has moved out of her mother's home and into her own place.   Spoke with her regarding signs/symptoms of PP Depression.   Mother was informed of CSW availability.      VI SOCIAL WORK PLAN  Type of pt/family education:   If child protective services report - county:   If child protective services report - date:   Information/referral to community resources comment:   Other social work plan:   Will monitor drug screen

## 2014-04-05 LAB — T.PALLIDUM AB, IGG: T pallidum Antibodies (TP-PA): 0.14 S/CO (ref <0.90)

## 2014-04-05 MED ORDER — FAMOTIDINE 20 MG PO TABS
20.0000 mg | ORAL_TABLET | Freq: Two times a day (BID) | ORAL | Status: DC
  Administered 2014-04-05 – 2014-04-06 (×3): 20 mg via ORAL
  Filled 2014-04-05 (×3): qty 1

## 2014-04-05 MED ORDER — POLYETHYLENE GLYCOL 3350 17 G PO PACK
17.0000 g | PACK | Freq: Every day | ORAL | Status: DC
  Administered 2014-04-05: 17 g via ORAL
  Filled 2014-04-05 (×3): qty 1

## 2014-04-05 NOTE — Progress Notes (Signed)
NSVD Post Partum Day 1 Subjective: no complaints, up ad lib, voiding, tolerating PO and + flatus, without BM, vaginal bleeding slowing down. No concerns at this time other than waiting on specific FTA-ABS results, requests that these are discussed privately only and not in front of family members.  Objective: Blood pressure 93/56, pulse 74, temperature 98.3 F (36.8 C), temperature source Oral, resp. rate 18, height 5' (1.524 m), weight 62.596 kg (138 lb), last menstrual period 06/19/2013, SpO2 100.00%  Physical Exam:  General: alert and cooperative Lochia: appropriate Uterine Fundus: firm at U DVT Evaluation: No evidence of DVT seen on physical exam. Negative Homan's sign. No cords or calf tenderness. No significant calf/ankle edema.   Recent Labs  04/03/14 0735 04/04/14 0552  HGB 8.6* 8.1*  HCT 27.8* 26.1*    Assessment/Plan: Krista Vaughn is a 28 y.o. [phone] s/p NSVD after IOL at [redacted]w[redacted]d due to cholestasis  - Pending FTA-ABS results (expected Monday 3:00 PM), RPR (positive, 04/03/14), low titer 1:1 - Evaluated by CSW, no further barriers, pending newborn meconium ds (UDS negative) - Expect to be discharged today vs tomorrow - Continue bottle feeding - Plans for BTL after discharge (since signed papers < 30 days) - To schedule [facility] OB f/u in 2 weeks to schedule BTL   LOS: 2 days   Krista Vaughn 04/05/2014, 9:13 AM   I have seen and examined this patient and agree with above documentation in the resident's note. Pt still unable to stool after 5 days. rx of miralax today to be given to attempt also to get the h. Pylori antigen specimen.  Plan for d/c tomorrow.    Krista Vaughn, M.D. [facility] Fellow 04/05/2014 9:21 AM

## 2014-04-05 NOTE — Progress Notes (Signed)
Ur chart review completed.  

## 2014-04-05 NOTE — Progress Notes (Signed)
UR chart review completed.  

## 2014-04-06 MED ORDER — POLYETHYLENE GLYCOL 3350 17 G PO PACK: 17.0000 g | PACK | Freq: Every day | ORAL | Status: DC | PRN

## 2014-04-06 MED ORDER — IBUPROFEN 600 MG PO TABS: 600.0000 mg | ORAL_TABLET | Freq: Four times a day (QID) | ORAL | Status: DC

## 2014-04-06 NOTE — Discharge Summary (Signed)
Obstetric Discharge Summary Reason for Admission: induction of labor due to cholestasis Prenatal Procedures: NST Intrapartum Procedures: spontaneous vaginal delivery Postpartum Procedures: none Complications-Operative and Postpartum: 1st degree perineal laceration Hemoglobin  Date Value Ref Range Status  04/04/2014 8.1* 12.0 - 15.0 g/dL Final     HCT  Date Value Ref Range Status  04/04/2014 26.1* 36.0 - 46.0 % Final    Discharge Diagnoses: Term Pregnancy-delivered, Cholestasis in pregnancy  Hospital Course:  Krista Vaughn is a 28 y.o. [id number] who presented for induction of labor for cholestasis.  She had a uncomplicated SVD within 24 hours of admission. She was able to ambulate, tolerate PO, reduced vaginal bleeding, and void normally. BM prior to discharge on Miralax therapy, collected stool sample for H Pylori ab (pending). Additionally, RPR on admission was reactive (previously negative, and suspected false positive due to pregnancy), titer 1:1 (low), and confirmatory FTA-Ab test (0.14, negative), also baby RPR (non-reactive). Previously on Ursodial for symptoms of cholestasis in pregnancy, discontinued on discharge. Discussed remote history of temporary Zoloft use (< 1 week), was not continued in hospital and not planning to resume on discharge. She was discharged home with instructions for postpartum care.    Delivery Note  At 12:47 AM a viable female was delivered via Vaginal, Spontaneous Delivery (Presentation: Right Occiput Anterior). APGAR: 9, 9; weight - pending.  Placenta status: Intact, Spontaneous. Cord: 3 vessels with the following complications: None. Cord pH: N/A.  Anesthesia: Epidural  Episiotomy: None  Lacerations: 1st degree;Perineal  Suture Repair: 3.0 vicryl rapide  Est. Blood Loss (mL): 200  Mom to postpartum. Baby to Couplet care / Skin to Skin.  Krista Vaughn  04/04/2014, 1:16 AM  Delivery by Dr. Lacinda Vaughn under direct supervision by me.  Plans to bottlefeed. Desires  BTL, papers <30d since signed, OK to go ahead and eat light meal, will discuss w/ attending.  Krista Vaughn, CNM, [facility]  04/04/2014  1:49 AM   Physical Exam:  General: alert and cooperative, well-appearing Heart: RRR Lungs: CTAB Lochia: appropriate Uterine Fundus: firm, U -2 DVT Evaluation: No evidence of DVT seen on physical exam. Negative Homan's sign. No cords or calf tenderness. No significant calf/ankle edema.  Discharge Information: Date: 04/06/2014 Activity: pelvic rest Diet: routine Medications: PNV, Ibuprofen, Iron and Miralax (until regular BM) Baby feeding: plans to bottle feed Contraception: bilateral tubal ligation (interval, return after discharge)  Condition: stable Instructions: refer to practice specific booklet Discharge to: home  Follow-up Information   Follow up with [facility]. Schedule an appointment as soon as possible for a visit in 2 weeks. (2 week appointment for routine follow-up and schedule Bilateral Tubal Ligation (BTL))    Specialty:  Obstetrics and Gynecology   Contact information:   [place] [address] [phone]      Newborn Data: Live born female  Birth Weight: 6 lb 5.9 oz (2890 g) APGAR: 9, 9  Home with mother.  Krista Vaughn, [place], PGY-1  04/06/2014, 7:25 AM  I have seen and examined this patient and agree with above documentation in the resident's note.   Krista Vaughn, M.D. [facility] Fellow 04/06/2014 7:52 AM

## 2014-04-06 NOTE — Discharge Instructions (Signed)

## 2014-04-06 NOTE — Discharge Summary (Signed)
Attestation of Attending Supervision of Fellow: Evaluation and management procedures were performed by the Fellow under my supervision and collaboration.  I have reviewed the Fellow's note and chart, and I agree with the management and plan.    

## 2014-04-07 ENCOUNTER — Telehealth: Payer: Self-pay | Admitting: *Deleted

## 2014-04-07 LAB — H.PYLORI ANTIGEN, STOOL: H. pylori ag, stool: POSITIVE

## 2014-04-07 NOTE — Telephone Encounter (Addendum)
Called patient, no answer, left message for patient to return call to the clinic. 4/9  1645  Spoke w/pt and informed her of stool culture results requiring additional medication to treat H Pylori bacteria.  Prescriptions e-prescribed to her pharmacy.  She was also informed of need to repeat stool culture after her treatment is completed.  Pt voiced understanding and will obtain medications.  Diane Day RNC

## 2014-04-07 NOTE — Telephone Encounter (Signed)
Message copied by Sue Lush on Wed Apr 07, 2014  9:51 AM ------      Message from: Donnamae Jude      Created: Wed Apr 07, 2014  8:23 AM       Her stool antigen was positive--needs treatment. Amoxicillin 1 gm po bid and Clarithromycin 500 mg bid x 14 days each and continue her Protonix twice daily for 2 wks--we should recollect another stool sample after treatment ------

## 2014-04-08 ENCOUNTER — Telehealth: Payer: Self-pay | Admitting: *Deleted

## 2014-04-08 MED ORDER — CLARITHROMYCIN 500 MG PO TABS: 500.0000 mg | ORAL_TABLET | Freq: Two times a day (BID) | ORAL | Status: DC

## 2014-04-08 MED ORDER — AMOXICILLIN 500 MG PO CAPS: ORAL_CAPSULE | ORAL | Status: DC

## 2014-04-08 NOTE — Telephone Encounter (Signed)
Pt called nurse line requesting different pain medication than motrin for breast pain, also wants to know if we can give her something to dry her milk up.  Called patient and discussed with interventions to cope with milk production, pt verbalizes understanding.

## 2014-04-09 ENCOUNTER — Telehealth: Payer: Self-pay | Admitting: General Practice

## 2014-04-09 NOTE — Telephone Encounter (Signed)
By chart review patient has pp visit scheduled for April 23, which is only two weeks out from delivery. Moved pp visit to 5/6 @ 245. Called patient and informed her of change in appts and discussed with her that the appt on the 23rd would have been too soon after delivery. Also discussed with patient that the doctors will not write for her to return to work that soon after delivery and that she needs a pp visit before she can return to work. Patient verbalized understanding and had no further questions.

## 2014-04-09 NOTE — Telephone Encounter (Signed)
Patient called and left message stating she wants to know if it is possible to return back to work before the 23rd.

## 2014-04-12 NOTE — Progress Notes (Signed)
NST reviewed and reactive.  

## 2014-04-12 NOTE — Anesthesia Postprocedure Evaluation (Signed)
Patient stable following vaginal delivery.  

## 2014-04-17 NOTE — Progress Notes (Signed)
NST reactive on 3/23

## 2014-04-21 ENCOUNTER — Encounter: Payer: Self-pay | Admitting: *Deleted

## 2014-04-22 ENCOUNTER — Ambulatory Visit: Payer: Medicaid Other | Admitting: Family Medicine

## 2014-04-22 ENCOUNTER — Telehealth: Payer: Self-pay

## 2014-04-22 MED ORDER — FLUCONAZOLE 150 MG PO TABS: 150.0000 mg | ORAL_TABLET | Freq: Once | ORAL | Status: DC

## 2014-04-22 NOTE — Telephone Encounter (Addendum)
Pt called and asked if she be prescribed something for a yeast infection I usually need two of them.  Called pt and informed pt that she can pick up Rx from her [facility]

## 2014-04-28 ENCOUNTER — Encounter: Payer: Self-pay | Admitting: Obstetrics & Gynecology

## 2014-04-30 ENCOUNTER — Encounter (HOSPITAL_COMMUNITY): Payer: Self-pay

## 2014-05-05 ENCOUNTER — Ambulatory Visit (INDEPENDENT_AMBULATORY_CARE_PROVIDER_SITE_OTHER): Payer: Medicaid Other | Admitting: Obstetrics & Gynecology

## 2014-05-05 ENCOUNTER — Encounter: Payer: Self-pay | Admitting: Obstetrics & Gynecology

## 2014-05-05 VITALS — BP 121/80 | HR 86 | Temp 98.7°F | Ht 60.0 in | Wt 121.4 lb

## 2014-05-05 DIAGNOSIS — A048 Other specified bacterial intestinal infections: Secondary | ICD-10-CM

## 2014-05-05 DIAGNOSIS — N898 Other specified noninflammatory disorders of vagina: Secondary | ICD-10-CM

## 2014-05-05 NOTE — Progress Notes (Signed)
   Subjective:    Patient ID: Krista Vaughn, female    DOB: 08/17/1986, 28 y.o.   MRN: [id number]  HPI She is doing well. Her interval tubal is scheduled for next week. She has not had sex yet. Her only complaint today is that one of her stitches on her perineum is annoying. She also complains of a smelly vaginal discharge (She has a h/o both BV and trich). She reports that the baby is growing well, sleeping for 3 hour intervals. She reports normal bowel and bladder function. 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 Speculum exam reveals a frothy discharge c/w BV and trich. I sent a wet prep.      Assessment & Plan:  Needs referral to FP for + H pylori Discharge- treat based on wet prep Contraception- BTL next week. Abstinence until then. LGSIL pap 11/14- rec pap in 2 months

## 2014-05-06 ENCOUNTER — Encounter (HOSPITAL_COMMUNITY): Payer: Self-pay | Admitting: *Deleted

## 2014-05-06 ENCOUNTER — Telehealth: Payer: Self-pay

## 2014-05-06 LAB — WET PREP, GENITAL
Clue Cells Wet Prep HPF POC: NONE SEEN
WBC, Wet Prep HPF POC: NONE SEEN
YEAST WET PREP: NONE SEEN

## 2014-05-06 NOTE — Telephone Encounter (Signed)
Pt. Called and states she has questions in regards to her FMLA papers. States the date needs to be changed for March 5th and re-faxed to her employer.   Attempted to call pt. No answer. Left message stating we are returning her call, please call clinic.

## 2014-05-06 NOTE — Telephone Encounter (Signed)
Pt. Returned call and stated she just needs her date changed from 04/04/14 to 03/04/14, states she spoke to Dr. Gala Romney about this, and states the end date can be today as she was told yesterday at her PP visit she could return today. Informed pt. We would refax this information to [phone]. Pt. Verbalized understanding and gratitude. No further questions or concerns.

## 2014-05-07 ENCOUNTER — Telehealth: Payer: Self-pay

## 2014-05-07 NOTE — Telephone Encounter (Signed)
Called pt to inform her that her FMLA paperwork has been corrected and faxed to [facility].  Unable to leave message due to VM box full.

## 2014-05-10 ENCOUNTER — Telehealth: Payer: Self-pay | Admitting: *Deleted

## 2014-05-10 DIAGNOSIS — B379 Candidiasis, unspecified: Secondary | ICD-10-CM

## 2014-05-10 DIAGNOSIS — T3695XA Adverse effect of unspecified systemic antibiotic, initial encounter: Secondary | ICD-10-CM

## 2014-05-10 DIAGNOSIS — A599 Trichomoniasis, unspecified: Secondary | ICD-10-CM

## 2014-05-10 MED ORDER — METRONIDAZOLE 500 MG PO TABS: 2000.0000 mg | ORAL_TABLET | Freq: Once | ORAL | Status: AC

## 2014-05-10 NOTE — Telephone Encounter (Signed)
Patient is positive for trichomonas on her wet prep. Rx sent to pharmacy. Called patient and left message to return our call.

## 2014-05-10 NOTE — Telephone Encounter (Signed)
Message copied by Mitchell Heir on Mon May 10, 2014  9:50 AM ------      Message from: Clovia Cuff C      Created: Mon May 10, 2014  8:20 AM       She has trich again. She will need a prescription for flagyl 2 grams once po. Her partner can also get this prescription if he is not allergic. They should not have sex until both are treated.      Thanks ------

## 2014-05-10 NOTE — Telephone Encounter (Signed)
Opened in error

## 2014-05-11 MED ORDER — FLUCONAZOLE 150 MG PO TABS: 150.0000 mg | ORAL_TABLET | Freq: Once | ORAL | Status: DC

## 2014-05-11 NOTE — Telephone Encounter (Signed)
Called patient and informed her of results and need to pickup medication at her [facility]. Patient verbalized understanding and stated she has not had sex since having the baby and wonders how she has this. Told patient the last time we tested her was mid March so sometime between now and then she has got it. Patient verbalized understanding and stated that every time she takes an antibiotic she gets a yeast infection, every time and would like something called in for yeast infection. Told patient I would send that in as well and should she decide to become sexually active she needs to wait a week following treatment. Patient verbalized understanding and had no further questions

## 2014-05-14 ENCOUNTER — Encounter (HOSPITAL_COMMUNITY): Admission: RE | Payer: Self-pay | Source: Ambulatory Visit

## 2014-05-14 ENCOUNTER — Encounter (HOSPITAL_COMMUNITY): Payer: Self-pay | Admitting: Anesthesiology

## 2014-05-14 ENCOUNTER — Ambulatory Visit (HOSPITAL_COMMUNITY)
Admission: RE | Admit: 2014-05-14 | Payer: Medicaid Other | Source: Ambulatory Visit | Admitting: Obstetrics & Gynecology

## 2014-05-14 SURGERY — LIGATION, FALLOPIAN TUBE, LAPAROSCOPIC
Anesthesia: General

## 2014-05-17 ENCOUNTER — Encounter: Payer: Self-pay | Admitting: *Deleted

## 2014-05-26 ENCOUNTER — Encounter: Payer: Self-pay | Admitting: General Practice

## 2014-07-27 ENCOUNTER — Telehealth: Payer: Self-pay | Admitting: *Deleted

## 2014-07-27 DIAGNOSIS — B379 Candidiasis, unspecified: Secondary | ICD-10-CM

## 2014-07-27 MED ORDER — FLUCONAZOLE 150 MG PO TABS: 150.0000 mg | ORAL_TABLET | Freq: Once | ORAL | Status: DC

## 2014-07-27 NOTE — Telephone Encounter (Signed)
Patient called stating the medication she needs a refill on is the Diflucan for a yeast infection. Called patient who states she is experiencing thick white discharge causing vaginal itching-- states she knows its a yeast infection and also states it usually takes 2 pills to clear. Informed patient I would send 1 dose with 1 refill to her pharmacy but advised she call to make an appointment or go to urgent care if symptoms do not resolve in 1 week. Patient verbalized understanding and gratitude. Medication e-prescribed. No further questions or concerns.

## 2014-07-27 NOTE — Telephone Encounter (Signed)
Pt called nurse line requesting medication refill, no other information given.  Will contact patient.  Attempted to contact patient, no answer, left message for patient to call clinic with specific information regarding prescription she needs refilled.

## 2014-10-12 ENCOUNTER — Encounter (HOSPITAL_COMMUNITY): Payer: Self-pay | Admitting: Emergency Medicine

## 2014-10-12 ENCOUNTER — Emergency Department (HOSPITAL_COMMUNITY)
Admission: EM | Admit: 2014-10-12 | Discharge: 2014-10-13 | Disposition: A | Discharge status: Home / Self Care | Payer: Medicaid Other | Attending: Emergency Medicine | Admitting: Emergency Medicine

## 2014-10-12 DIAGNOSIS — Z79899 Other long term (current) drug therapy: Secondary | ICD-10-CM | POA: Insufficient documentation

## 2014-10-12 DIAGNOSIS — Z8679 Personal history of other diseases of the circulatory system: Secondary | ICD-10-CM | POA: Insufficient documentation

## 2014-10-12 DIAGNOSIS — Z8709 Personal history of other diseases of the respiratory system: Secondary | ICD-10-CM | POA: Insufficient documentation

## 2014-10-12 DIAGNOSIS — Z87891 Personal history of nicotine dependence: Secondary | ICD-10-CM | POA: Insufficient documentation

## 2014-10-12 DIAGNOSIS — R1030 Lower abdominal pain, unspecified: Secondary | ICD-10-CM | POA: Insufficient documentation

## 2014-10-12 DIAGNOSIS — Z8719 Personal history of other diseases of the digestive system: Secondary | ICD-10-CM | POA: Insufficient documentation

## 2014-10-12 DIAGNOSIS — B349 Viral infection, unspecified: Secondary | ICD-10-CM | POA: Insufficient documentation

## 2014-10-12 HISTORY — DX: Other specified bacterial intestinal infections: A04.8

## 2014-10-12 LAB — URINALYSIS, ROUTINE W REFLEX MICROSCOPIC
Bilirubin Urine: NEGATIVE
Glucose, UA: NEGATIVE mg/dL
Hgb urine dipstick: NEGATIVE
Ketones, ur: NEGATIVE mg/dL
LEUKOCYTES UA: NEGATIVE
Nitrite: NEGATIVE
PROTEIN: NEGATIVE mg/dL
Specific Gravity, Urine: 1.011 (ref 1.005–1.030)
Urobilinogen, UA: 0.2 mg/dL (ref 0.0–1.0)
pH: 6 (ref 5.0–8.0)

## 2014-10-12 LAB — COMPREHENSIVE METABOLIC PANEL
ALK PHOS: 61 U/L (ref 39–117)
ALT: 15 U/L (ref 0–35)
AST: 18 U/L (ref 0–37)
Albumin: 4.1 g/dL (ref 3.5–5.2)
Anion gap: 16 — ABNORMAL HIGH (ref 5–15)
BUN: 6 mg/dL (ref 6–23)
CALCIUM: 9 mg/dL (ref 8.4–10.5)
CO2: 21 meq/L (ref 19–32)
Chloride: 102 mEq/L (ref 96–112)
Creatinine, Ser: 0.65 mg/dL (ref 0.50–1.10)
GLUCOSE: 117 mg/dL — AB (ref 70–99)
POTASSIUM: 3.2 meq/L — AB (ref 3.7–5.3)
Sodium: 139 mEq/L (ref 137–147)
Total Bilirubin: 0.3 mg/dL (ref 0.3–1.2)
Total Protein: 8 g/dL (ref 6.0–8.3)

## 2014-10-12 LAB — CBC WITH DIFFERENTIAL/PLATELET
BASOS PCT: 0 % (ref 0–1)
Basophils Absolute: 0 10*3/uL (ref 0.0–0.1)
EOS ABS: 0 10*3/uL (ref 0.0–0.7)
Eosinophils Relative: 0 % (ref 0–5)
HCT: 39.2 % (ref 36.0–46.0)
Hemoglobin: 12.4 g/dL (ref 12.0–15.0)
Lymphocytes Relative: 26 % (ref 12–46)
Lymphs Abs: 1.4 10*3/uL (ref 0.7–4.0)
MCH: 26.4 pg (ref 26.0–34.0)
MCHC: 31.6 g/dL (ref 30.0–36.0)
MCV: 83.4 fL (ref 78.0–100.0)
Monocytes Absolute: 0.3 10*3/uL (ref 0.1–1.0)
Monocytes Relative: 5 % (ref 3–12)
NEUTROS PCT: 69 % (ref 43–77)
Neutro Abs: 3.7 10*3/uL (ref 1.7–7.7)
PLATELETS: 201 10*3/uL (ref 150–400)
RBC: 4.7 MIL/uL (ref 3.87–5.11)
RDW: 16.1 % — ABNORMAL HIGH (ref 11.5–15.5)
WBC: 5.4 10*3/uL (ref 4.0–10.5)

## 2014-10-12 MED ORDER — SODIUM CHLORIDE 0.9 % IV BOLUS (SEPSIS)
1000.0000 mL | Freq: Once | INTRAVENOUS | Status: AC
  Administered 2014-10-12: 1000 mL via INTRAVENOUS

## 2014-10-12 MED ORDER — ONDANSETRON 4 MG PO TBDP
8.0000 mg | ORAL_TABLET | Freq: Once | ORAL | Status: AC
  Administered 2014-10-12: 8 mg via ORAL
  Filled 2014-10-12: qty 2

## 2014-10-12 MED ORDER — OXYCODONE-ACETAMINOPHEN 5-325 MG PO TABS
1.0000 | ORAL_TABLET | Freq: Once | ORAL | Status: AC
  Administered 2014-10-12: 1 via ORAL
  Filled 2014-10-12: qty 1

## 2014-10-12 NOTE — ED Notes (Signed)
Pt was at work last night and had some loose stool and vomited with abd pain. Pt states she hurts all over and has been having chills. Has still be vomiting this morning.

## 2014-10-12 NOTE — ED Provider Notes (Signed)
CSN: [id number]     Arrival date & time 10/12/14  1837 History   First MD Initiated Contact with Patient 10/12/14 2052     Chief Complaint  Patient presents with  . Generalized Body Aches  . Emesis  . Diarrhea     (Consider location/radiation/quality/duration/timing/severity/associated sxs/prior Treatment) HPI Comments: Sunday night developed fever, headache sudden low abdominal pain followed by a loose BM and vomiting.  Since that time has had myalgia, fever, chills States has taken tylenol with minimal relief   Patient is a 28 y.o. female presenting with vomiting and diarrhea. The history is provided by the patient.  Emesis Severity:  Moderate Duration:  2 days Timing:  Sporadic Quality:  Bilious material Able to tolerate:  Liquids Progression:  Improving Chronicity:  New Recent urination:  Normal Relieved by:  Nothing Associated symptoms: chills, cough, diarrhea, fever, headaches, myalgias and sore throat   Associated symptoms: no URI   Cough:    Cough characteristics:  Non-productive   Severity:  Mild   Onset quality:  Unable to specify   Duration:  2 days   Progression:  Unchanged   Chronicity:  New Diarrhea:    Quality:  Watery   Number of occurrences:  1   Severity:  Mild   Timing:  Sporadic   Progression:  Unchanged Fever:    Timing:  Unable to specify   Temp source:  Unable to specify Headaches:    Severity:  Mild   Onset quality:  Gradual   Timing:  Constant   Progression:  Unchanged Myalgias:    Location:  Generalized   Quality:  Aching   Severity:  Moderate   Onset quality:  Sudden   Duration:  2 days   Timing:  Constant   Progression:  Unchanged Sore throat:    Severity:  Mild   Onset quality:  Sudden   Timing:  Constant   Progression:  Unchanged Diarrhea Quality:  Watery Severity:  Mild Onset quality:  Sudden Number of episodes:  1 Duration:  2 days Timing:  Sporadic Progression:  Unchanged Relieved by:  Nothing Associated symptoms:  chills, cough, fever, headaches, myalgias and vomiting   Associated symptoms: no URI     Past Medical History  Diagnosis Date  . Chlamydia   . Bronchitis   . Depression   . Anxiety   . Migraine   . Cholecystitis   . H. pylori infection    Past Surgical History  Procedure Laterality Date  . Induced abortion    . Therapeutic abortion    . No past surgeries     Family History  Problem Relation Age of Onset  . Down syndrome Sister    History  Substance Use Topics  . Smoking status: Former Smoker    Types: Cigarettes    Quit date: 01/08/2014  . Smokeless tobacco: Never Used  . Alcohol Use: No   OB History   Grav Para Term Preterm Abortions TAB SAB Ect Mult Living   6 3 3  3 3  0   3     Review of Systems  Constitutional: Positive for fever and chills.  HENT: Positive for sore throat.   Respiratory: Positive for cough. Negative for shortness of breath.   Gastrointestinal: Positive for vomiting and diarrhea.  Genitourinary: Negative for dysuria.  Musculoskeletal: Positive for myalgias.  Skin: Negative for rash and wound.  Neurological: Positive for headaches. Negative for weakness.      Allergies  Ambien and Decadron  Home Medications  Prior to Admission medications   Medication Sig Start Date End Date Taking? Authorizing Provider  acetaminophen (TYLENOL) 500 MG tablet Take 500-1,000 mg by mouth every 6 (six) hours as needed for mild pain, fever or headache.   Yes Historical Provider, MD  guaiFENesin (MUCINEX) 600 MG 12 hr tablet Take 600 mg by mouth 2 (two) times daily.   Yes Historical Provider, MD  ibuprofen (ADVIL,MOTRIN) 200 MG tablet Take 200-800 mg by mouth every 6 (six) hours as needed for fever, headache, mild pain or moderate pain.   Yes Historical Provider, MD   BP 103/56  Pulse 64  Temp(Src) 98.5 F (36.9 C) (Oral)  Resp 14  Ht 5' (1.524 m)  Wt 125 lb (56.7 kg)  BMI 24.41 kg/m2  SpO2 100%  LMP 10/02/2014 Physical Exam  Nursing note and  vitals reviewed. Constitutional: She is oriented to person, place, and time. She appears well-developed and well-nourished.  HENT:  Head: Normocephalic.  Mouth/Throat: Oropharynx is clear and moist.  Eyes: Pupils are equal, round, and reactive to light.  Neck: Normal range of motion.  Pulmonary/Chest: Effort normal. She has no wheezes. She has no rales. She exhibits no tenderness.  Abdominal: Soft. Bowel sounds are normal.  Musculoskeletal: Normal range of motion. She exhibits no edema and no tenderness.  Lymphadenopathy:    She has no cervical adenopathy.  Neurological: She is alert and oriented to person, place, and time.  Skin: Skin is warm. No rash noted.    ED Course  Procedures (including critical care time) Labs Review Labs Reviewed  COMPREHENSIVE METABOLIC PANEL - Abnormal; Notable for the following:    Potassium 3.2 (*)    Glucose, Bld 117 (*)    Anion gap 16 (*)    All other components within normal limits  CBC WITH DIFFERENTIAL - Abnormal; Notable for the following:    RDW 16.1 (*)    All other components within normal limits  URINALYSIS, ROUTINE W REFLEX MICROSCOPIC - Abnormal; Notable for the following:    APPearance CLOUDY (*)    All other components within normal limits    Imaging Review No results found.   EKG Interpretation None      MDM   Final diagnoses:  Viral illness        Garald Balding, NP 10/15/14 1118  Garald Balding, NP 10/15/14 1118

## 2014-10-20 NOTE — ED Provider Notes (Signed)
Medical screening examination/treatment/procedure(s) were performed by non-physician practitioner and as supervising physician I was immediately available for consultation/collaboration.   EKG Interpretation None        Tanna Furry, MD 10/20/14 2327

## 2014-11-01 ENCOUNTER — Encounter (HOSPITAL_COMMUNITY): Payer: Self-pay | Admitting: Emergency Medicine

## 2014-11-14 IMAGING — US US RENAL
1 series · 14 of 25 positions shown · non-contrast
Comparison: Renal ultrasound 11/20/2013

CLINICAL DATA: Thirty-three weeks pregnant. Rule out hydronephrosis

EXAM:
RENAL/URINARY TRACT ULTRASOUND COMPLETE

[Series 1: us renal · 14 of 29 slices shown]
[im 1/29]
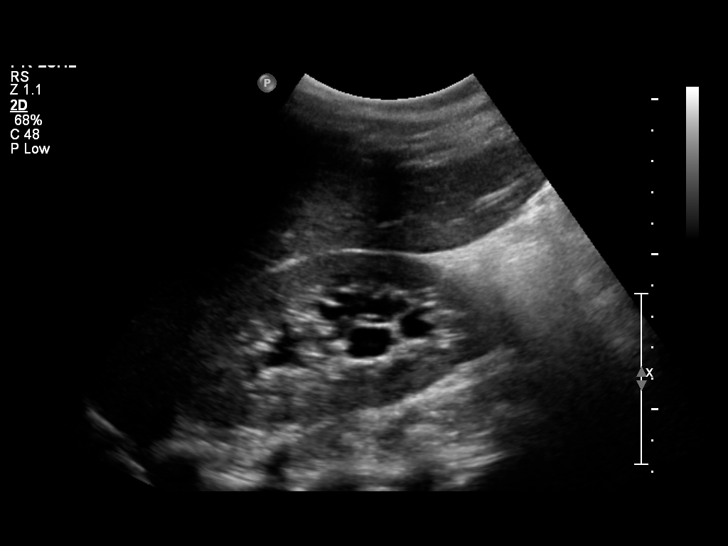
[im 3/29]
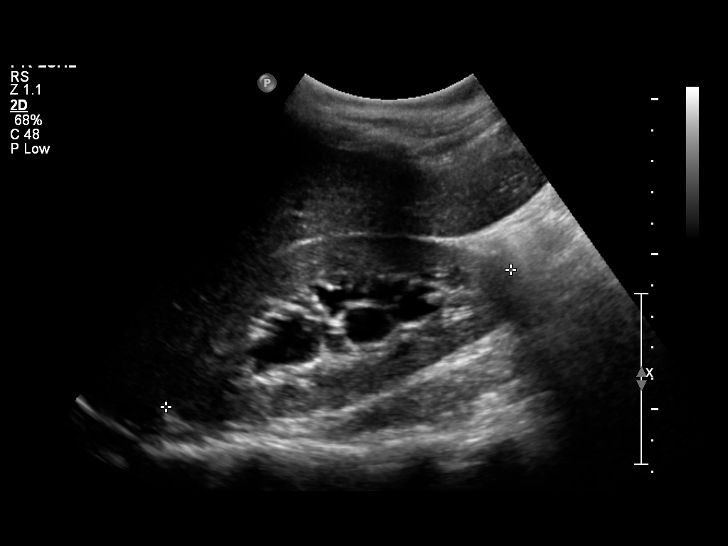
[im 5/29]
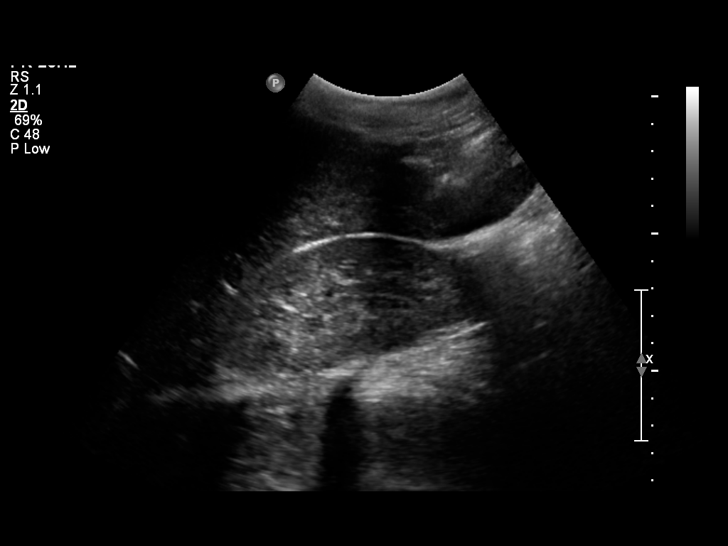
[im 8/29]
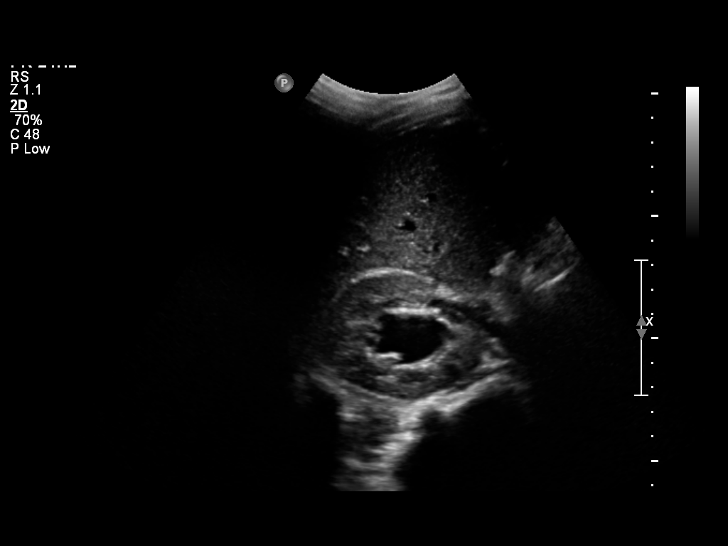
[im 10/29]
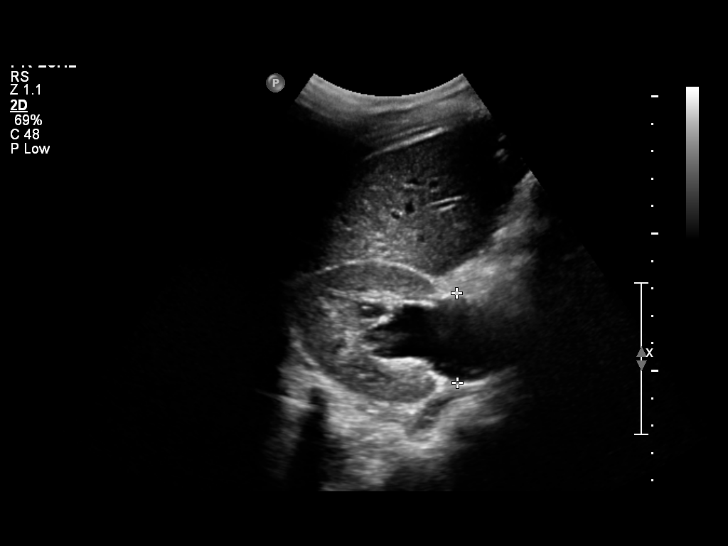
[im 11/29]
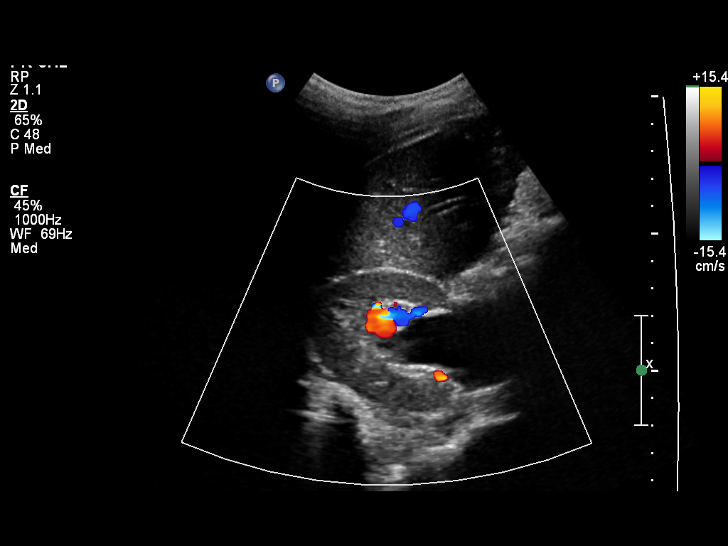
[im 13/29]
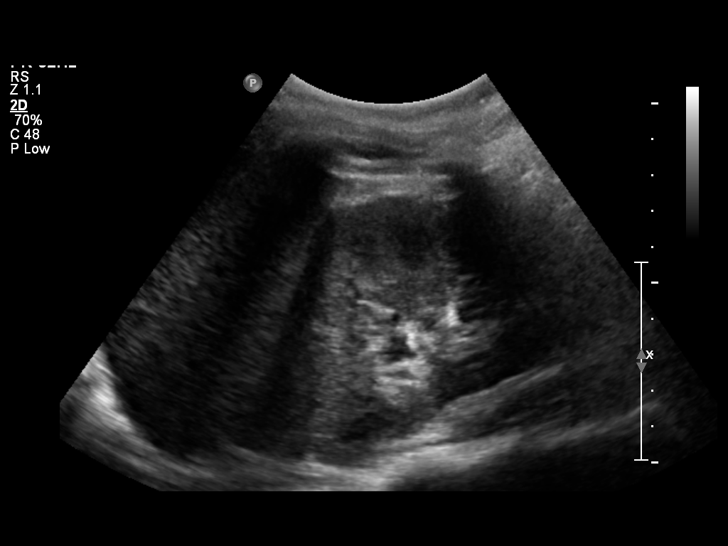
[im 16/29]
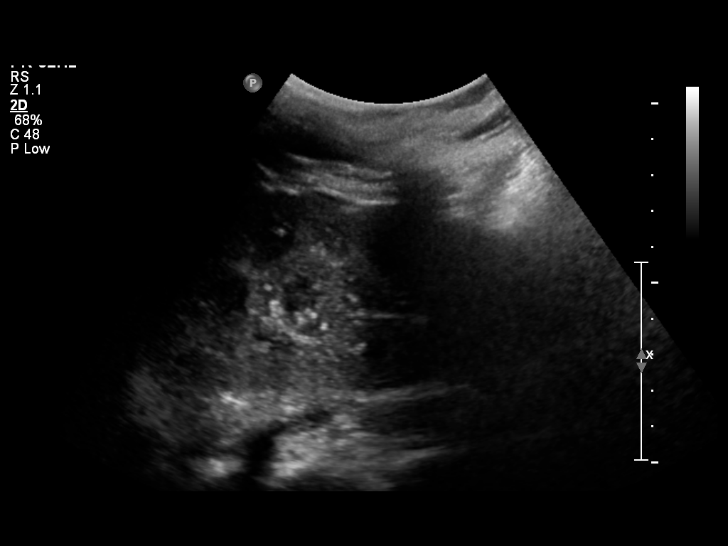
[im 18/29]
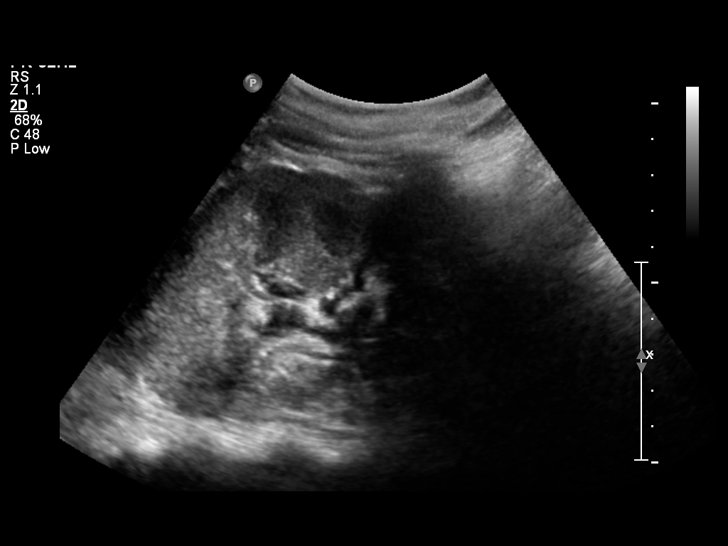
[im 19/29]
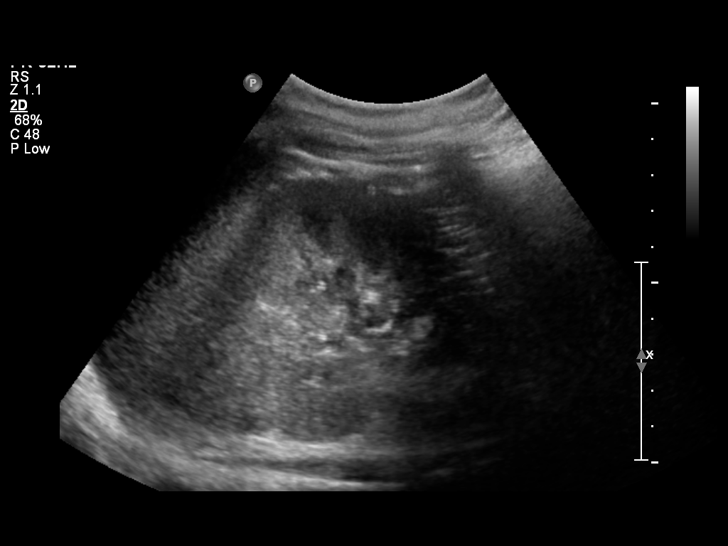
[im 22/29]
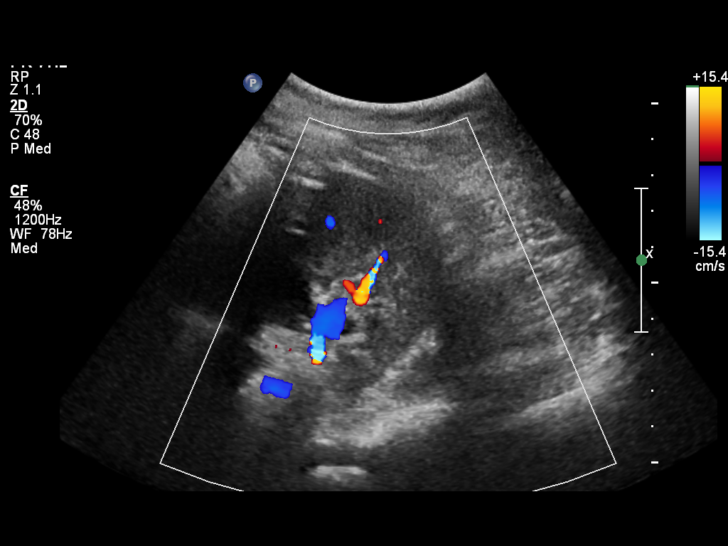
[im 24/29]
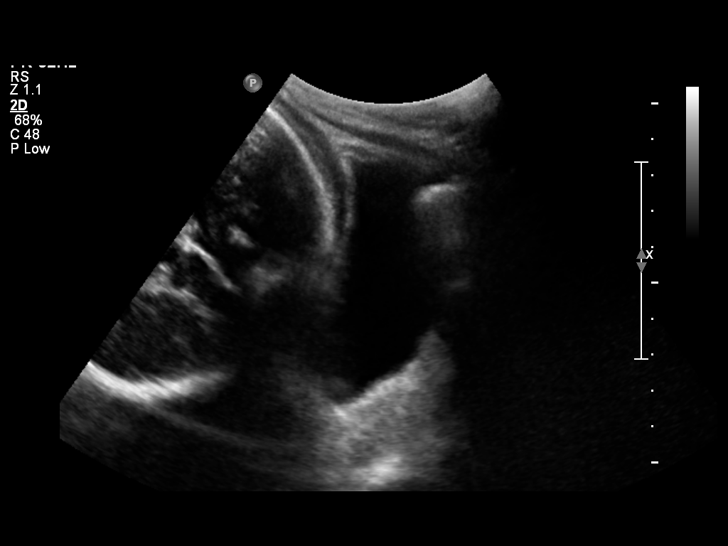
[im 26/29]
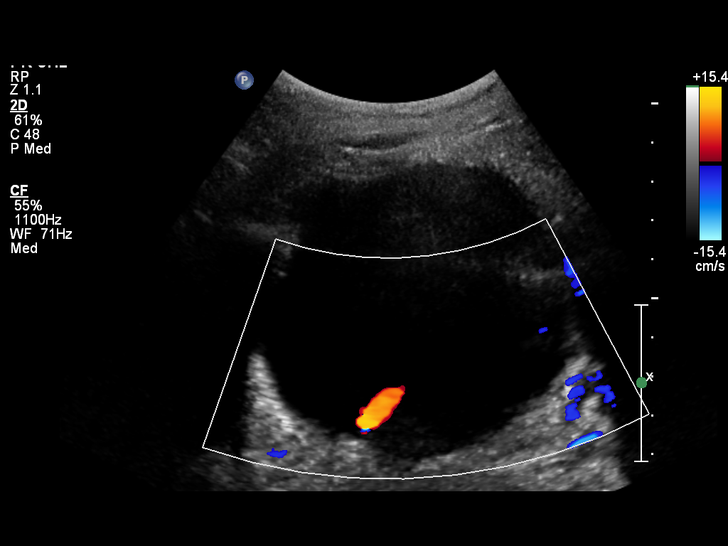
[im 29/29]
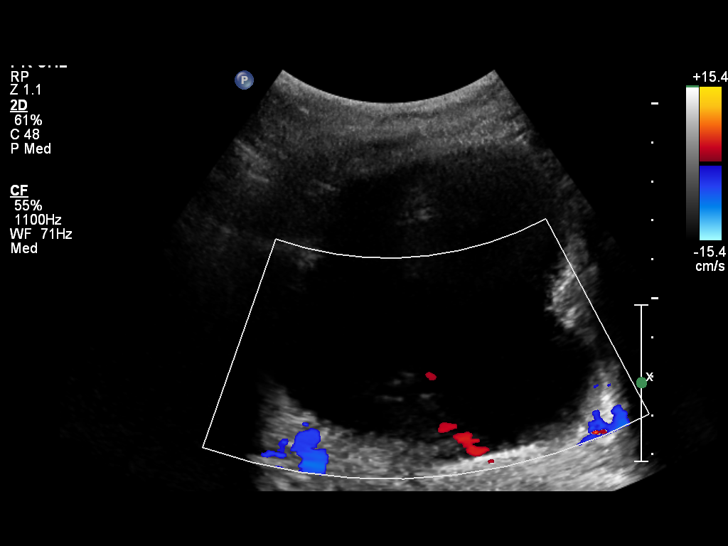

[14 of 25 positions shown; findings below may reference images not displayed]

FINDINGS: Right Kidney:

Length: 11.9 cm. Moderate hydronephrosis, progressive since the
prior ultrasound. Negative for renal mass.

Left Kidney:

Length: 11.2 cm. Echogenicity within normal limits. No mass or
hydronephrosis visualized.

Bladder:

Appears normal for degree of bladder distention.
IMPRESSION: Moderate right hydronephrosis, progressive since the prior study. No
obstruction of the left kidney.

## 2014-12-01 ENCOUNTER — Encounter: Payer: Self-pay | Admitting: Family Medicine

## 2014-12-02 ENCOUNTER — Encounter: Payer: Self-pay | Admitting: Obstetrics & Gynecology

## 2015-04-25 ENCOUNTER — Emergency Department (HOSPITAL_COMMUNITY)
Admission: EM | Admit: 2015-04-25 | Discharge: 2015-04-25 | Disposition: A | Discharge status: Home / Self Care | Payer: Medicaid Other | Attending: Emergency Medicine | Admitting: Emergency Medicine

## 2015-04-25 ENCOUNTER — Encounter (HOSPITAL_COMMUNITY): Payer: Self-pay | Admitting: Emergency Medicine

## 2015-04-25 ENCOUNTER — Emergency Department (HOSPITAL_COMMUNITY): Payer: Medicaid Other

## 2015-04-25 DIAGNOSIS — R0789 Other chest pain: Secondary | ICD-10-CM

## 2015-04-25 DIAGNOSIS — Z8719 Personal history of other diseases of the digestive system: Secondary | ICD-10-CM | POA: Insufficient documentation

## 2015-04-25 DIAGNOSIS — Z8659 Personal history of other mental and behavioral disorders: Secondary | ICD-10-CM | POA: Insufficient documentation

## 2015-04-25 DIAGNOSIS — Z8679 Personal history of other diseases of the circulatory system: Secondary | ICD-10-CM | POA: Insufficient documentation

## 2015-04-25 DIAGNOSIS — R0781 Pleurodynia: Secondary | ICD-10-CM | POA: Insufficient documentation

## 2015-04-25 DIAGNOSIS — Z8709 Personal history of other diseases of the respiratory system: Secondary | ICD-10-CM | POA: Insufficient documentation

## 2015-04-25 DIAGNOSIS — Z720 Tobacco use: Secondary | ICD-10-CM | POA: Insufficient documentation

## 2015-04-25 DIAGNOSIS — Z8619 Personal history of other infectious and parasitic diseases: Secondary | ICD-10-CM | POA: Insufficient documentation

## 2015-04-25 MED ORDER — OXYCODONE-ACETAMINOPHEN 5-325 MG PO TABS
1.0000 | ORAL_TABLET | Freq: Once | ORAL | Status: AC
  Administered 2015-04-25: 1 via ORAL
  Filled 2015-04-25: qty 1

## 2015-04-25 MED ORDER — OXYCODONE-ACETAMINOPHEN 5-325 MG PO TABS: 1.0000 | ORAL_TABLET | Freq: Four times a day (QID) | ORAL | Status: DC | PRN
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
## 2015-04-25 NOTE — Discharge Instructions (Signed)

## 2015-04-25 NOTE — ED Notes (Signed)
Awake. Verbally responsive. A/O x4. Resp even and unlabored. No audible adventitious breath sounds noted. ABC's intact.  
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
## 2015-04-25 NOTE — ED Notes (Signed)
Patient transported to X-ray 

## 2017-02-04 ENCOUNTER — Other Ambulatory Visit: Payer: Self-pay | Admitting: Obstetrics & Gynecology

## 2017-03-12 ENCOUNTER — Emergency Department (HOSPITAL_BASED_OUTPATIENT_CLINIC_OR_DEPARTMENT_OTHER): Payer: Self-pay

## 2017-03-12 ENCOUNTER — Encounter (HOSPITAL_BASED_OUTPATIENT_CLINIC_OR_DEPARTMENT_OTHER): Payer: Self-pay | Admitting: *Deleted

## 2017-03-12 ENCOUNTER — Emergency Department (HOSPITAL_BASED_OUTPATIENT_CLINIC_OR_DEPARTMENT_OTHER)
Admission: EM | Admit: 2017-03-12 | Discharge: 2017-03-12 | Disposition: A | Discharge status: Home / Self Care | Payer: Self-pay | Attending: Emergency Medicine | Admitting: Emergency Medicine

## 2017-03-12 DIAGNOSIS — R197 Diarrhea, unspecified: Secondary | ICD-10-CM | POA: Insufficient documentation

## 2017-03-12 DIAGNOSIS — J0110 Acute frontal sinusitis, unspecified: Secondary | ICD-10-CM | POA: Insufficient documentation

## 2017-03-12 DIAGNOSIS — J069 Acute upper respiratory infection, unspecified: Secondary | ICD-10-CM | POA: Insufficient documentation

## 2017-03-12 DIAGNOSIS — F17210 Nicotine dependence, cigarettes, uncomplicated: Secondary | ICD-10-CM | POA: Insufficient documentation

## 2017-03-12 LAB — RAPID STREP SCREEN (MED CTR MEBANE ONLY): Streptococcus, Group A Screen (Direct): NEGATIVE

## 2017-03-12 MED ORDER — LORATADINE 10 MG PO TABS
10.0000 mg | ORAL_TABLET | Freq: Once | ORAL | Status: AC
  Administered 2017-03-12: 10 mg via ORAL
  Filled 2017-03-12: qty 1

## 2017-03-12 MED ORDER — IBUPROFEN 600 MG PO TABS: 600.0000 mg | ORAL_TABLET | Freq: Four times a day (QID) | ORAL | 0 refills | Status: DC | PRN

## 2017-03-12 MED ORDER — FLUTICASONE PROPIONATE 50 MCG/ACT NA SUSP: 1.0000 | Freq: Every day | NASAL | 0 refills | Status: DC

## 2017-03-12 MED ORDER — OXYMETAZOLINE HCL 0.05 % NA SOLN
1.0000 | Freq: Once | NASAL | Status: AC
  Administered 2017-03-12: 1 via NASAL
  Filled 2017-03-12: qty 15

## 2017-03-12 MED ORDER — IBUPROFEN 400 MG PO TABS
600.0000 mg | ORAL_TABLET | Freq: Once | ORAL | Status: AC
  Administered 2017-03-12: 600 mg via ORAL
  Filled 2017-03-12: qty 1

## 2017-03-12 MED ORDER — LORATADINE 10 MG PO TABS: 10.0000 mg | ORAL_TABLET | Freq: Every day | ORAL | 0 refills | Status: DC

## 2017-03-12 NOTE — ED Provider Notes (Signed)
[facility] DEPT Provider Note   CSN: [id number] Arrival date & time: 03/12/17  1553  By signing my name below, I, Dolores Hoose, attest that this documentation has been prepared under the direction and in the presence of Julianne Rice, MD . Electronically Signed: Dolores Hoose, Scribe. 03/12/2017. 7:39 PM.  History   Chief Complaint Chief Complaint  Patient presents with  . Cough   The history is provided by the patient. No language interpreter was used.    HPI Comments:  Krista Vaughn is a 31 y.o. female  who presents to the Emergency Department complaining of constant, unchanged, mild headache onset 2 days ago. Pt reports associated cough, chills, diarrhea, and sore throat. She describes her cough as productive of gray-green sputum. No modifying factors indicated. She has tried sudafed at home with minimal relief of symptoms. Pt notes that her son has been sick recently with the flu.    Past Medical History:  Diagnosis Date  . Anxiety   . Bronchitis   . Chlamydia   . Cholecystitis   . Depression   . H. pylori infection   . Migraine     Patient Active Problem List   Diagnosis Date Noted  . Acute esophagitis 03/14/2014  . Cholestasis of pregnancy in third trimester 03/06/2014  . Trichomonal infection 03/06/2014  . Yeast infection of the vagina 03/04/2014  . Anxiety in pregnancy in second trimester, antepartum 12/10/2013  . Depression complicating pregnancy in second trimester, antepartum 12/10/2013  . Ultrasound recheck of fetal pyelectasis, antepartum 12/07/2013  . Low grade squamous intraepithelial lesion (LGSIL) on Pap smear 11/29/2013  . Marijuana use 11/22/2013  . Late prenatal care complicating pregnancy in second trimester 11/18/2013    Past Surgical History:  Procedure Laterality Date  . INDUCED ABORTION    . NO PAST SURGERIES    . THERAPEUTIC ABORTION      OB History    Gravida Para Term Preterm AB Living   6 3 3   3 3    SAB TAB Ectopic  Multiple Live Births   0 3     3       Home Medications    Prior to Admission medications   Medication Sig Start Date End Date Taking? Authorizing Provider  fluticasone (FLONASE) 50 MCG/ACT nasal spray Place 1 spray into both nostrils daily. 03/12/17   Julianne Rice, MD  ibuprofen (ADVIL,MOTRIN) 600 MG tablet Take 1 tablet (600 mg total) by mouth every 6 (six) hours as needed. 03/12/17   Julianne Rice, MD  loratadine (CLARITIN) 10 MG tablet Take 1 tablet (10 mg total) by mouth daily. 03/13/17   Julianne Rice, MD    Family History Family History  Problem Relation Age of Onset  . Down syndrome Sister     Social History Social History  Substance Use Topics  . Smoking status: Heavy Tobacco Smoker    Types: Cigarettes    Last attempt to quit: 01/08/2014  . Smokeless tobacco: Never Used  . Alcohol use Yes     Comment: occ     Allergies   Ambien [zolpidem] and Decadron [dexamethasone]   Review of Systems Review of Systems  Constitutional: Positive for chills and fatigue. Negative for fever.  HENT: Positive for congestion, sinus pain, sinus pressure and sore throat. Negative for ear pain.   Eyes: Negative for pain and visual disturbance.  Respiratory: Positive for cough. Negative for shortness of breath and wheezing.   Cardiovascular: Negative for chest pain, palpitations and leg swelling.  Gastrointestinal: Positive for diarrhea. Negative for abdominal pain, constipation and nausea.  Genitourinary: Negative for dysuria, flank pain and frequency.  Musculoskeletal: Negative for back pain, joint swelling, myalgias, neck pain and neck stiffness.  Skin: Negative for rash and wound.  Neurological: Positive for headaches. Negative for dizziness, weakness, light-headedness and numbness.  All other systems reviewed and are negative.    Physical Exam Updated Vital Signs BP 116/84 (BP Location: Right Arm)   Pulse 84   Temp 98.1 F (36.7 C) (Oral)   Resp 22   Ht 5' (1.524 m)    Wt 140 lb (63.5 kg)   LMP 02/14/2017   SpO2 100%   BMI 27.34 kg/m   Physical Exam  Constitutional: She is oriented to person, place, and time. She appears well-developed and well-nourished. No distress.  HENT:  Head: Normocephalic and atraumatic.  Mouth/Throat: Oropharynx is clear and moist.  Bilateral frontal sinus tenderness to percussion. Patient has nasal mucosal edema. Pharynx is mildly erythematous but no exudates and uvula is midline.  Eyes: EOM are normal. Pupils are equal, round, and reactive to light.  Neck: Normal range of motion. Neck supple.  No meningismus or cervical lymphadenopathy.  Cardiovascular: Normal rate, regular rhythm, normal heart sounds and intact distal pulses.  Exam reveals no gallop and no friction rub.   No murmur heard. Pulmonary/Chest: Effort normal and breath sounds normal. No respiratory distress. She has no wheezes. She has no rales. She exhibits no tenderness.  Abdominal: Soft. Bowel sounds are normal. There is no tenderness. There is no rebound and no guarding.  Musculoskeletal: Normal range of motion. She exhibits no edema or tenderness.  No CVA tenderness. No lower extremity swelling or asymmetry.  Neurological: She is alert and oriented to person, place, and time.  Moving all extremities without deficit. Sensation fully intact.  Skin: Skin is warm and dry. Capillary refill takes less than 2 seconds. No rash noted. No erythema.  Psychiatric: She has a normal mood and affect. Her behavior is normal.  Nursing note and vitals reviewed.    ED Treatments / Results  DIAGNOSTIC STUDIES:  Oxygen Saturation is 100% on RA, normal by my interpretation.    COORDINATION OF CARE:  7:50 PM Discussed treatment plan with pt at bedside which includes medications to relieve her sinus pressure and pt agreed to plan.  Labs (all labs ordered are listed, but only abnormal results are displayed) Labs Reviewed  RAPID STREP SCREEN (NOT AT [facility])  CULTURE,  GROUP A STREP [facility])    EKG  EKG Interpretation None       Radiology No results found.  Procedures Procedures (including critical care time)  Medications Ordered in ED Medications  loratadine (CLARITIN) tablet 10 mg (10 mg Oral Given 03/12/17 1958)  oxymetazoline (AFRIN) 0.05 % nasal spray 1 spray (1 spray Each Nare Given 03/12/17 1959)  ibuprofen (ADVIL,MOTRIN) tablet 600 mg (600 mg Oral Given 03/12/17 1958)     Initial Impression / Assessment and Plan / ED Course  I have reviewed the triage vital signs and the nursing notes.  Pertinent labs & imaging results that were available during my care of the patient were reviewed by me and considered in my medical decision making (see chart for details).    Strep negative. X-rays without acute findings. Suspect sinusitis/URI. We'll start on Flonase and Claritin. Advised ibuprofen and Tylenol for fever and pain. Return precautions given.   Final Clinical Impressions(s) / ED Diagnoses   Final diagnoses:  Acute frontal sinusitis,  recurrence not specified  Acute URI    New Prescriptions Discharge Medication List as of 03/12/2017  8:34 PM    START taking these medications   Details  fluticasone (FLONASE) 50 MCG/ACT nasal spray Place 1 spray into both nostrils daily., Starting Tue 03/12/2017, Print    loratadine (CLARITIN) 10 MG tablet Take 1 tablet (10 mg total) by mouth daily., Starting Wed 03/13/2017, Print      I personally performed the services described in this documentation, which was scribed in my presence. The recorded information has been reviewed and is accurate.       Julianne Rice, MD 03/15/17 [phone]

## 2017-03-12 NOTE — ED Notes (Signed)
Pt verbalizes understanding of d/c instructions and denies any further needs at this time. 

## 2017-03-12 NOTE — ED Triage Notes (Signed)
C/o cough with yellow green sputum and fever. Bodyaches. Diarrhea no n/v. C/o facial pain.

## 2017-03-15 LAB — CULTURE, GROUP A STREP (THRC)

## 2017-04-15 ENCOUNTER — Emergency Department (HOSPITAL_BASED_OUTPATIENT_CLINIC_OR_DEPARTMENT_OTHER)
Admission: EM | Admit: 2017-04-15 | Discharge: 2017-04-15 | Disposition: A | Discharge status: Home / Self Care | Payer: Medicaid Other | Attending: Emergency Medicine | Admitting: Emergency Medicine

## 2017-04-15 ENCOUNTER — Encounter (HOSPITAL_BASED_OUTPATIENT_CLINIC_OR_DEPARTMENT_OTHER): Payer: Self-pay

## 2017-04-15 DIAGNOSIS — R69 Illness, unspecified: Secondary | ICD-10-CM

## 2017-04-15 DIAGNOSIS — M549 Dorsalgia, unspecified: Secondary | ICD-10-CM | POA: Insufficient documentation

## 2017-04-15 DIAGNOSIS — R509 Fever, unspecified: Secondary | ICD-10-CM | POA: Insufficient documentation

## 2017-04-15 DIAGNOSIS — J029 Acute pharyngitis, unspecified: Secondary | ICD-10-CM | POA: Insufficient documentation

## 2017-04-15 DIAGNOSIS — R630 Anorexia: Secondary | ICD-10-CM | POA: Insufficient documentation

## 2017-04-15 DIAGNOSIS — R0981 Nasal congestion: Secondary | ICD-10-CM | POA: Insufficient documentation

## 2017-04-15 DIAGNOSIS — M791 Myalgia: Secondary | ICD-10-CM | POA: Insufficient documentation

## 2017-04-15 DIAGNOSIS — R05 Cough: Secondary | ICD-10-CM | POA: Insufficient documentation

## 2017-04-15 DIAGNOSIS — F17210 Nicotine dependence, cigarettes, uncomplicated: Secondary | ICD-10-CM | POA: Insufficient documentation

## 2017-04-15 DIAGNOSIS — J111 Influenza due to unidentified influenza virus with other respiratory manifestations: Secondary | ICD-10-CM

## 2017-04-15 MED ORDER — OSELTAMIVIR PHOSPHATE 75 MG PO CAPS
75.0000 mg | ORAL_CAPSULE | Freq: Once | ORAL | Status: AC
  Administered 2017-04-15: 75 mg via ORAL
  Filled 2017-04-15: qty 1

## 2017-04-15 MED ORDER — IBUPROFEN 400 MG PO TABS
600.0000 mg | ORAL_TABLET | Freq: Once | ORAL | Status: AC
  Administered 2017-04-15: 23:00:00 600 mg via ORAL
  Filled 2017-04-15: qty 1

## 2017-04-15 MED ORDER — ACETAMINOPHEN 325 MG PO TABS
ORAL_TABLET | ORAL | Status: AC
  Filled 2017-04-15: qty 2

## 2017-04-15 MED ORDER — CYCLOBENZAPRINE HCL 10 MG PO TABS
10.0000 mg | ORAL_TABLET | Freq: Once | ORAL | Status: AC
  Administered 2017-04-15: 10 mg via ORAL
  Filled 2017-04-15: qty 1

## 2017-04-15 MED ORDER — CYCLOBENZAPRINE HCL 10 MG PO TABS: 10.0000 mg | ORAL_TABLET | Freq: Every evening | ORAL | 0 refills | Status: DC | PRN

## 2017-04-15 MED ORDER — OSELTAMIVIR PHOSPHATE 75 MG PO CAPS: 75.0000 mg | ORAL_CAPSULE | Freq: Two times a day (BID) | ORAL | 0 refills | Status: DC

## 2017-04-15 MED ORDER — ACETAMINOPHEN 325 MG PO TABS
650.0000 mg | ORAL_TABLET | Freq: Once | ORAL | Status: AC
  Administered 2017-04-15: 650 mg via ORAL

## 2017-04-15 NOTE — ED Triage Notes (Signed)
c/o with body aches, fever x today-NAD-steady gait-last dose motrin 2 hours PTA

## 2017-04-15 NOTE — ED Provider Notes (Signed)
[facility] Provider Note   CSN: [id number] Arrival date & time: 04/15/17  2010   By signing my name below, I, Charolotte Eke, attest that this documentation has been prepared under the direction and in the presence of Janetta Hora, PA-C. Electronically Signed: Charolotte Eke, Scribe. 04/15/17. 10:35 PM.    History   Chief Complaint Chief Complaint  Patient presents with  . Generalized Body Aches    HPI Krista Vaughn is a 31 y.o. female who presents to the Emergency Department complaining of gradual onset, moderate, persistent generalized body aches that started yesterday. Pt has associated chills, fever, cough, sore throat, congestion, back pain, lack of appetite. Pt reports negative sick contact. Pt has not had a flu shot this year. Pt has been taking mucinex, sudafed, and motrin at home with limited relief. Pt does not take any medications on a daily basis. Pt has been using heating pad with limited relief. Pt denies nausea, abdominal pain, emesis, SOB, rhinorrhea. Pt is a smoker.     The history is provided by the patient. No language interpreter was used.    Past Medical History:  Diagnosis Date  . Anxiety   . Bronchitis   . Chlamydia   . Cholecystitis   . Depression   . H. pylori infection   . Migraine     Patient Active Problem List   Diagnosis Date Noted  . Acute esophagitis 03/14/2014  . Cholestasis of pregnancy in third trimester 03/06/2014  . Trichomonal infection 03/06/2014  . Yeast infection of the vagina 03/04/2014  . Anxiety in pregnancy in second trimester, antepartum 12/10/2013  . Depression complicating pregnancy in second trimester, antepartum 12/10/2013  . Ultrasound recheck of fetal pyelectasis, antepartum 12/07/2013  . Low grade squamous intraepithelial lesion (LGSIL) on Pap smear 11/29/2013  . Marijuana use 11/22/2013  . Late prenatal care complicating pregnancy in second trimester 11/18/2013    Past Surgical History:  Procedure  Laterality Date  . INDUCED ABORTION    . NO PAST SURGERIES    . THERAPEUTIC ABORTION      OB History    Gravida Para Term Preterm AB Living   6 3 3   3 3    SAB TAB Ectopic Multiple Live Births   0 3     3       Home Medications    Prior to Admission medications   Not on File    Family History Family History  Problem Relation Age of Onset  . Down syndrome Sister     Social History Social History  Substance Use Topics  . Smoking status: Current Every Day Smoker    Types: Cigarettes    Last attempt to quit: 01/08/2014  . Smokeless tobacco: Never Used  . Alcohol use Yes     Comment: occ     Allergies   Ambien [zolpidem] and Decadron [dexamethasone]   Review of Systems Review of Systems  Constitutional: Positive for appetite change, chills and fever.  HENT: Positive for congestion and sore throat. Negative for rhinorrhea.   Respiratory: Positive for cough. Negative for shortness of breath.   Gastrointestinal: Negative for abdominal pain, diarrhea, nausea and vomiting.  Musculoskeletal: Positive for arthralgias, back pain and myalgias.     Physical Exam Updated Vital Signs BP 118/66 (BP Location: Right Arm)   Pulse 96   Temp (!) 101.6 F (38.7 C) (Oral)   Resp (!) 24   Ht 5' (1.524 m)   Wt 140 lb (63.5 kg)  LMP 04/13/2017   SpO2 99%   BMI 27.34 kg/m   Physical Exam  Constitutional: She is oriented to person, place, and time. She appears well-developed and well-nourished. No distress.  HENT:  Head: Normocephalic and atraumatic.  Right Ear: Hearing, tympanic membrane, external ear and ear canal normal.  Left Ear: Hearing, tympanic membrane, external ear and ear canal normal.  Nose: Nose normal.  Mouth/Throat: Uvula is midline, oropharynx is clear and moist and mucous membranes are normal.  Eyes: Conjunctivae are normal. Pupils are equal, round, and reactive to light. Right eye exhibits no discharge. Left eye exhibits no discharge. No scleral icterus.    Neck: Normal range of motion.  Cardiovascular: Normal rate and regular rhythm.  Exam reveals no gallop and no friction rub.   No murmur heard. Pulmonary/Chest: Effort normal and breath sounds normal. She has no wheezes. She has no rales. She exhibits no tenderness.  Abdominal: Soft. Bowel sounds are normal. She exhibits no distension and no mass. There is no tenderness. There is no rebound and no guarding. No hernia.  Musculoskeletal:  Left sided mid back tenderness  Neurological: She is alert and oriented to person, place, and time.  Skin: Skin is warm and dry.  Psychiatric: She has a normal mood and affect. Her behavior is normal.  Nursing note and vitals reviewed.    ED Treatments / Results   DIAGNOSTIC STUDIES: Oxygen Saturation is 99% on room air, normal by my interpretation.    COORDINATION OF CARE: 10:32 PM Discussed treatment plan with pt at bedside and pt agreed to plan, which includes symptom relief for the flu including Theraflu.    Labs (all labs ordered are listed, but only abnormal results are displayed) Labs Reviewed - No data to display  EKG  EKG Interpretation None       Radiology No results found.  Procedures Procedures (including critical care time)  Medications Ordered in ED Medications  acetaminophen (TYLENOL) tablet 650 mg (650 mg Oral Given 04/15/17 2144)  oseltamivir (TAMIFLU) capsule 75 mg (75 mg Oral Given 04/15/17 2244)  ibuprofen (ADVIL,MOTRIN) tablet 600 mg (600 mg Oral Given 04/15/17 2243)  cyclobenzaprine (FLEXERIL) tablet 10 mg (10 mg Oral Given 04/15/17 2244)     Initial Impression / Assessment and Plan / ED Course   Patient with symptoms consistent with influenza.  Vitals are stable, low-grade fever.  No signs of dehydration, tolerating PO's.  Lungs are clear. Due to patient's presentation and physical exam a chest x-ray was not ordered bc likely diagnosis of flu.  Discussed the cost versus benefit of Tamiflu treatment with the  patient.  She would like rx for Tamiflu. Will also rx muscle relaxer for back spasms. Patient will be discharged with instructions to orally hydrate, rest, and use over-the-counter medications such as anti-inflammatories ibuprofen and Aleve for muscle aches and Tylenol for fever. Return precautions given.   I have reviewed the triage vital signs and the nursing notes.  Pertinent labs & imaging results that were available during my care of the patient were reviewed by me and considered in my medical decision making (see chart for details).   Final Clinical Impressions(s) / ED Diagnoses   Final diagnoses:  Influenza-like illness    New Prescriptions New Prescriptions   No medications on file   I personally performed the services described in this documentation, which was scribed in my presence. The recorded information has been reviewed and is accurate.     Recardo Evangelist, PA-C 04/16/17 [phone]  Veryl Speak, MD 04/16/17 2132

## 2017-04-15 NOTE — Discharge Instructions (Signed)
Please rest and drink plenty of fluids Take Tamiflu for the next 5 days Take Flexeril for muscle spasms Take Ibuprofen and Tylenol for pain and fever

## 2018-01-26 ENCOUNTER — Emergency Department (HOSPITAL_COMMUNITY)
Admission: EM | Admit: 2018-01-26 | Discharge: 2018-01-26 | Disposition: A | Discharge status: Home / Self Care | Payer: No Typology Code available for payment source | Attending: Emergency Medicine | Admitting: Emergency Medicine

## 2018-01-26 ENCOUNTER — Emergency Department (HOSPITAL_COMMUNITY): Payer: No Typology Code available for payment source

## 2018-01-26 ENCOUNTER — Encounter (HOSPITAL_COMMUNITY): Payer: Self-pay | Admitting: Emergency Medicine

## 2018-01-26 DIAGNOSIS — Y9389 Activity, other specified: Secondary | ICD-10-CM | POA: Insufficient documentation

## 2018-01-26 DIAGNOSIS — R072 Precordial pain: Secondary | ICD-10-CM | POA: Insufficient documentation

## 2018-01-26 DIAGNOSIS — Y92411 Interstate highway as the place of occurrence of the external cause: Secondary | ICD-10-CM | POA: Diagnosis not present

## 2018-01-26 DIAGNOSIS — Z79899 Other long term (current) drug therapy: Secondary | ICD-10-CM | POA: Insufficient documentation

## 2018-01-26 DIAGNOSIS — M549 Dorsalgia, unspecified: Secondary | ICD-10-CM | POA: Insufficient documentation

## 2018-01-26 DIAGNOSIS — R3981 Functional urinary incontinence: Secondary | ICD-10-CM | POA: Diagnosis not present

## 2018-01-26 DIAGNOSIS — F17210 Nicotine dependence, cigarettes, uncomplicated: Secondary | ICD-10-CM | POA: Insufficient documentation

## 2018-01-26 DIAGNOSIS — M542 Cervicalgia: Secondary | ICD-10-CM

## 2018-01-26 DIAGNOSIS — M25512 Pain in left shoulder: Secondary | ICD-10-CM | POA: Insufficient documentation

## 2018-01-26 MED ORDER — FENTANYL CITRATE (PF) 100 MCG/2ML IJ SOLN
50.0000 ug | Freq: Once | INTRAMUSCULAR | Status: AC
  Administered 2018-01-26: 100 ug via INTRAMUSCULAR
  Filled 2018-01-26: qty 2

## 2018-01-26 MED ORDER — IBUPROFEN 800 MG PO TABS
800.0000 mg | ORAL_TABLET | Freq: Once | ORAL | Status: AC
  Administered 2018-01-26: 800 mg via ORAL
  Filled 2018-01-26: qty 1

## 2018-01-26 MED ORDER — METHOCARBAMOL 500 MG PO TABS: 500.0000 mg | ORAL_TABLET | Freq: Every evening | ORAL | 0 refills | Status: DC | PRN

## 2018-01-26 MED ORDER — NAPROXEN 500 MG PO TABS: 500.0000 mg | ORAL_TABLET | Freq: Two times a day (BID) | ORAL | 0 refills | Status: DC
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
## 2018-10-03 ENCOUNTER — Encounter (HOSPITAL_BASED_OUTPATIENT_CLINIC_OR_DEPARTMENT_OTHER): Payer: Self-pay | Admitting: *Deleted

## 2018-10-03 ENCOUNTER — Emergency Department (HOSPITAL_BASED_OUTPATIENT_CLINIC_OR_DEPARTMENT_OTHER)
Admission: EM | Admit: 2018-10-03 | Discharge: 2018-10-03 | Disposition: A | Discharge status: Home / Self Care | Payer: Self-pay | Attending: Emergency Medicine | Admitting: Emergency Medicine

## 2018-10-03 ENCOUNTER — Emergency Department (HOSPITAL_BASED_OUTPATIENT_CLINIC_OR_DEPARTMENT_OTHER): Payer: Self-pay

## 2018-10-03 ENCOUNTER — Other Ambulatory Visit: Payer: Self-pay

## 2018-10-03 DIAGNOSIS — F17210 Nicotine dependence, cigarettes, uncomplicated: Secondary | ICD-10-CM | POA: Insufficient documentation

## 2018-10-03 DIAGNOSIS — G43909 Migraine, unspecified, not intractable, without status migrainosus: Secondary | ICD-10-CM | POA: Insufficient documentation

## 2018-10-03 DIAGNOSIS — R070 Pain in throat: Secondary | ICD-10-CM | POA: Insufficient documentation

## 2018-10-03 DIAGNOSIS — J988 Other specified respiratory disorders: Secondary | ICD-10-CM | POA: Insufficient documentation

## 2018-10-03 DIAGNOSIS — R197 Diarrhea, unspecified: Secondary | ICD-10-CM | POA: Insufficient documentation

## 2018-10-03 DIAGNOSIS — M7918 Myalgia, other site: Secondary | ICD-10-CM | POA: Insufficient documentation

## 2018-10-03 DIAGNOSIS — B9789 Other viral agents as the cause of diseases classified elsewhere: Secondary | ICD-10-CM | POA: Insufficient documentation

## 2018-10-03 DIAGNOSIS — R51 Headache: Secondary | ICD-10-CM | POA: Insufficient documentation

## 2018-10-03 DIAGNOSIS — J029 Acute pharyngitis, unspecified: Secondary | ICD-10-CM

## 2018-10-03 DIAGNOSIS — H53149 Visual discomfort, unspecified: Secondary | ICD-10-CM | POA: Insufficient documentation

## 2018-10-03 LAB — GROUP A STREP BY PCR: Group A Strep by PCR: NOT DETECTED

## 2018-10-03 LAB — PREGNANCY, URINE: PREG TEST UR: NEGATIVE

## 2018-10-03 MED ORDER — METOCLOPRAMIDE HCL 5 MG/ML IJ SOLN
10.0000 mg | Freq: Once | INTRAMUSCULAR | Status: AC
  Administered 2018-10-03: 10 mg via INTRAVENOUS
  Filled 2018-10-03: qty 2

## 2018-10-03 MED ORDER — LORAZEPAM 2 MG/ML IJ SOLN
INTRAMUSCULAR | Status: AC
  Filled 2018-10-03: qty 1

## 2018-10-03 MED ORDER — ONDANSETRON HCL 4 MG/2ML IJ SOLN
4.0000 mg | Freq: Once | INTRAMUSCULAR | Status: AC
  Administered 2018-10-03: 4 mg via INTRAVENOUS
  Filled 2018-10-03: qty 2

## 2018-10-03 MED ORDER — SODIUM CHLORIDE 0.9 % IV BOLUS
1000.0000 mL | Freq: Once | INTRAVENOUS | Status: AC
  Administered 2018-10-03: 1000 mL via INTRAVENOUS

## 2018-10-03 MED ORDER — KETOROLAC TROMETHAMINE 30 MG/ML IJ SOLN
30.0000 mg | Freq: Once | INTRAMUSCULAR | Status: AC
  Administered 2018-10-03: 30 mg via INTRAVENOUS
  Filled 2018-10-03: qty 1

## 2018-10-03 MED ORDER — DIPHENHYDRAMINE HCL 50 MG/ML IJ SOLN
25.0000 mg | Freq: Once | INTRAMUSCULAR | Status: AC
  Administered 2018-10-03: 25 mg via INTRAVENOUS
  Filled 2018-10-03: qty 1

## 2018-10-03 MED ORDER — LORAZEPAM 2 MG/ML IJ SOLN
1.0000 mg | Freq: Once | INTRAMUSCULAR | Status: AC
  Administered 2018-10-03: 1 mg via INTRAVENOUS

## 2018-10-03 NOTE — ED Provider Notes (Signed)
[place] EMERGENCY DEPARTMENT Provider Note   CSN: [id number] Arrival date & time: 10/03/18  1137     History   Chief Complaint Chief Complaint  Patient presents with  . Facial Pain  . Migraine    HPI Krista Vaughn is a 32 y.o. female history anxiety, bronchitis, depression, migraines who presents for evaluation of facial pain, generalized headache, sore throat, body aches and diarrhea that have been ongoing since yesterday.  She reports yesterday, she measured a fever of 104.0 states she has been taking Tylenol and ibuprofen for her fever.  She has not taken any Tylenol or ibuprofen since last night.  Patient also reports she has had a sore throat.  She states she has been able to tolerate her secretions and p.o. without any difficulty.  She states it is more painful to swallow.  She has not had any vomiting or difficulty breathing.  Additionally, patient states that she began having a headache earlier this morning.  She states that headache was a gradual in nature and denies any thunderclap headache.  She denies any preceding trauma, injury, fall.  She does report a history of migraines and states that today's symptoms feel similar to her previous migraines.  Patient reports associated photophobia.  Patient reports that her son was sick with a similar virus a few days prior to her symptoms.  Patient denies any vomiting, chest pain, difficulty breathing, abdominal pain, numbness/weakness of her arms or legs.   The history is provided by the patient.    Past Medical History:  Diagnosis Date  . Anxiety   . Bronchitis   . Chlamydia   . Cholecystitis   . Depression   . H. pylori infection   . Migraine     Patient Active Problem List   Diagnosis Date Noted  . Acute esophagitis 03/14/2014  . Cholestasis of pregnancy in third trimester 03/06/2014  . Trichomonal infection 03/06/2014  . Yeast infection of the vagina 03/04/2014  . Anxiety in pregnancy in second  trimester, antepartum 12/10/2013  . Depression complicating pregnancy in second trimester, antepartum 12/10/2013  . Ultrasound recheck of fetal pyelectasis, antepartum 12/07/2013  . Low grade squamous intraepithelial lesion (LGSIL) on Pap smear 11/29/2013  . Marijuana use 11/22/2013  . Late prenatal care complicating pregnancy in second trimester 11/18/2013    Past Surgical History:  Procedure Laterality Date  . INDUCED ABORTION    . NO PAST SURGERIES    . THERAPEUTIC ABORTION       OB History    Gravida  6   Para  3   Term  3   Preterm      AB  3   Living  3     SAB  0   TAB  3   Ectopic      Multiple      Live Births  3            Home Medications    Prior to Admission medications   Medication Sig Start Date End Date Taking? Authorizing Provider  cyclobenzaprine (FLEXERIL) 10 MG tablet Take 1 tablet (10 mg total) by mouth at bedtime and may repeat dose one time if needed. 04/15/17   Recardo Evangelist, PA-C  methocarbamol (ROBAXIN) 500 MG tablet Take 1 tablet (500 mg total) by mouth at bedtime as needed. 01/26/18   Avie Echevaria B, PA-C  naproxen (NAPROSYN) 500 MG tablet Take 1 tablet (500 mg total) by mouth 2 (two) times daily with  a meal. 01/26/18   Emeline General, PA-C  oseltamivir (TAMIFLU) 75 MG capsule Take 1 capsule (75 mg total) by mouth every 12 (twelve) hours. 04/15/17   Recardo Evangelist, PA-C    Family History Family History  Problem Relation Age of Onset  . Down syndrome Sister     Social History Social History   Tobacco Use  . Smoking status: Current Every Day Smoker    Types: Cigarettes    Last attempt to quit: 01/08/2014    Years since quitting: 4.7  . Smokeless tobacco: Never Used  Substance Use Topics  . Alcohol use: Yes    Comment: occ  . Drug use: No     Allergies   Ambien [zolpidem] and Decadron [dexamethasone]   Review of Systems Review of Systems  Constitutional: Positive for fever (resolved).  HENT:  Positive for sore throat. Negative for drooling and trouble swallowing.   Eyes: Positive for photophobia.  Respiratory: Negative for cough and shortness of breath.   Cardiovascular: Negative for chest pain.  Gastrointestinal: Positive for diarrhea. Negative for abdominal pain, nausea and vomiting.  Genitourinary: Negative for dysuria and hematuria.  Musculoskeletal: Positive for myalgias.  Neurological: Negative for weakness, numbness and headaches.  All other systems reviewed and are negative.    Physical Exam Updated Vital Signs BP (!) 101/58 (BP Location: Right Arm)   Pulse 84   Temp 98.4 F (36.9 C) (Oral)   Resp 16   Ht 5' (1.524 m)   Wt 68 kg   LMP 09/14/2018   SpO2 98%   BMI 29.29 kg/m   Physical Exam  Constitutional: She is oriented to person, place, and time. She appears well-developed and well-nourished.  HENT:  Head: Normocephalic and atraumatic.  Mouth/Throat: Uvula is midline and mucous membranes are normal. No trismus in the jaw. Posterior oropharyngeal erythema present.  Airways patent, phonation is intact.  Posterior oropharynx slightly erythematous.  No evidence of exudates.  Uvula is midline.  No trismus.  Eyes: Pupils are equal, round, and reactive to light. Conjunctivae, EOM and lids are normal.  EOMs intact, PERRL  Neck: Full passive range of motion without pain. Neck supple. No neck rigidity.  Full flexion/extension and lateral movement of neck fully intact. No bony midline tenderness. No deformities or crepitus.  Neck is supple without any rigidity.  Negative Brudzinski's, negative Kernig's.  Cardiovascular: Normal rate, regular rhythm, normal heart sounds and normal pulses. Exam reveals no gallop and no friction rub.  No murmur heard. Pulmonary/Chest: Effort normal and breath sounds normal.  Abdominal: Soft. Normal appearance. There is no tenderness. There is no rigidity and no guarding.  Musculoskeletal: Normal range of motion.  Neurological: She is  alert and oriented to person, place, and time.  Cranial nerves III-XII intact Follows commands, Moves all extremities  5/5 strength to BUE and BLE  Sensation intact throughout all major nerve distributions Normal coordination No gait abnormalities  No slurred speech. No facial droop.   Skin: Skin is warm and dry. Capillary refill takes less than 2 seconds.  Psychiatric: She has a normal mood and affect. Her speech is normal.  Nursing note and vitals reviewed.    ED Treatments / Results  Labs (all labs ordered are listed, but only abnormal results are displayed) Labs Reviewed  GROUP A STREP BY PCR  PREGNANCY, URINE    EKG None  Radiology Dg Chest 2 View  Result Date: 10/03/2018 CLINICAL DATA:  Facial pain.  Chills.  Headache.  Sore throat.  EXAM: CHEST - 2 VIEW COMPARISON:  01/26/2018 FINDINGS: Heart size is normal. Mediastinal shadows are normal. The lungs are clear. No bronchial thickening. No infiltrate, mass, effusion or collapse. Pulmonary vascularity is normal. No bony abnormality. IMPRESSION: Normal chest Electronically Signed   By: Nelson Chimes M.D.   On: 10/03/2018 14:32    Procedures Procedures (including critical care time)  Medications Ordered in ED Medications  LORazepam (ATIVAN) 2 MG/ML injection (has no administration in time range)  sodium chloride 0.9 % bolus 1,000 mL (0 mLs Intravenous Stopped 10/03/18 1721)  ondansetron (ZOFRAN) injection 4 mg (4 mg Intravenous Given 10/03/18 1547)  metoCLOPramide (REGLAN) injection 10 mg (10 mg Intravenous Given 10/03/18 1550)  diphenhydrAMINE (BENADRYL) injection 25 mg (25 mg Intravenous Given 10/03/18 1548)  ketorolac (TORADOL) 30 MG/ML injection 30 mg (30 mg Intravenous Given 10/03/18 1549)  LORazepam (ATIVAN) injection 1 mg (1 mg Intravenous Given 10/03/18 1604)     Initial Impression / Assessment and Plan / ED Course  I have reviewed the triage vital signs and the nursing notes.  Pertinent labs & imaging results that  were available during my care of the patient were reviewed by me and considered in my medical decision making (see chart for details).     32 year old female who presents for evaluation of generalized body aches, nasal congestion, rhinorrhea, sore throat, facial pain, headache and fever.  Patient reports that the symptoms started yesterday.  Son was recently sick with similar symptoms.  She states she has a history of migraines and states headache feels similar nature.  No preceding trauma, injury, fall.  She denies any numbness/weakness, vision changes, vomiting.  He does have some associated photophobia which she states is normal. Patient is afebrile, non-toxic appearing, sitting comfortably on examination table. Vital signs reviewed and stable. No neuro deficits noted on exam.  Patient is afebrile here.  On exam, patient does not have any neck rigidity or meningismus signs.  Her exam is not concerning for meningitis.  Given that she has a history of migraines and today's headache feels similar, no indication for CT head for for evaluation of any acute intracranial abnormality.  Her oropharynx slightly erythematous.  Consider pharyngitis.  History/physical exam is not concerning for Ludwig angina or peritonsillar abscess.  Plan to check strep, chest x-ray.  Will give migraine cocktail.  Strep reviewed negative.  Chest x-ray negative for any acute ab normality.  Reevaluation after migraine cocktail.  Patient reports improvement in headache.  We will plan to p.o. challenge patient department.  Patient will tolerate p.o. without any difficulty.  She reports that headache is completely resolved.  Patient is ambulating in the department without difficulty.  Vital signs are stable.  Patient stable for discharge at this time. Patient had ample opportunity for questions and discussion. All patient's questions were answered with full understanding. Strict return precautions discussed. Patient expresses  understanding and agreement to plan.   Final Clinical Impressions(s) / ED Diagnoses   Final diagnoses:  Migraine without status migrainosus, not intractable, unspecified migraine type  Viral respiratory illness  Sore throat    ED Discharge Orders    None       Desma Mcgregor 10/03/18 Cam Hai, MD 10/03/18 2214

## 2018-10-03 NOTE — ED Notes (Signed)
ED Provider at bedside. 

## 2018-10-03 NOTE — ED Triage Notes (Signed)
Facial pain, chills, headache, sore throat, body aches and diarrhea.

## 2018-10-03 NOTE — Discharge Instructions (Signed)
You can take Tylenol or Ibuprofen as directed for pain. You can alternate Tylenol and Ibuprofen every 4 hours. If you take Tylenol at 1pm, then you can take Ibuprofen at 5pm. Then you can take Tylenol again at 9pm.   Make sure you are drinking plenty of fluids stay hydrated.  Follow-up with your primary care doctor or [facility] for further evaluation.  Return to emergency department for any high fever, vomiting, worsening headache, numbness/weakness of your arms or legs, difficulty breathing, chest pain or any other worsening or concerning symptoms.

## 2019-03-06 ENCOUNTER — Emergency Department (HOSPITAL_COMMUNITY)
Admission: EM | Admit: 2019-03-06 | Discharge: 2019-03-06 | Disposition: A | Discharge status: Home / Self Care | Payer: Self-pay | Attending: Emergency Medicine | Admitting: Emergency Medicine

## 2019-03-06 ENCOUNTER — Other Ambulatory Visit: Payer: Self-pay

## 2019-03-06 ENCOUNTER — Encounter (HOSPITAL_COMMUNITY): Payer: Self-pay | Admitting: Emergency Medicine

## 2019-03-06 DIAGNOSIS — L0231 Cutaneous abscess of buttock: Secondary | ICD-10-CM | POA: Insufficient documentation

## 2019-03-06 DIAGNOSIS — Z79899 Other long term (current) drug therapy: Secondary | ICD-10-CM | POA: Insufficient documentation

## 2019-03-06 DIAGNOSIS — F17210 Nicotine dependence, cigarettes, uncomplicated: Secondary | ICD-10-CM | POA: Insufficient documentation

## 2019-03-06 MED ORDER — SULFAMETHOXAZOLE-TRIMETHOPRIM 800-160 MG PO TABS
1.0000 | ORAL_TABLET | Freq: Once | ORAL | Status: AC
  Administered 2019-03-06: 1 via ORAL
  Filled 2019-03-06: qty 1

## 2019-03-06 MED ORDER — HYDROCODONE-ACETAMINOPHEN 5-325 MG PO TABS: 1.0000 | ORAL_TABLET | Freq: Four times a day (QID) | ORAL | 0 refills | Status: DC | PRN

## 2019-03-06 MED ORDER — LIDOCAINE-EPINEPHRINE (PF) 2 %-1:200000 IJ SOLN
10.0000 mL | Freq: Once | INTRAMUSCULAR | Status: AC
  Administered 2019-03-06: 10 mL
  Filled 2019-03-06: qty 20

## 2019-03-06 MED ORDER — OXYCODONE-ACETAMINOPHEN 5-325 MG PO TABS
2.0000 | ORAL_TABLET | Freq: Once | ORAL | Status: AC
  Administered 2019-03-06: 2 via ORAL
  Filled 2019-03-06: qty 2

## 2019-03-06 MED ORDER — SULFAMETHOXAZOLE-TRIMETHOPRIM 800-160 MG PO TABS: 1.0000 | ORAL_TABLET | Freq: Two times a day (BID) | ORAL | 0 refills | Status: AC

## 2019-03-06 NOTE — ED Provider Notes (Signed)
[facility] EMERGENCY DEPARTMENT Provider Note   CSN: [id number] Arrival date & time: 03/06/19  2145    History   Chief Complaint Chief Complaint  Patient presents with  . Abscess    HPI Krista Vaughn is a 33 y.o. female.     The history is provided by the patient. No language interpreter was used.  Abscess  Location:  Pelvis Pelvic abscess location:  Gluteal cleft Abscess quality: fluctuance, induration and painful   Red streaking: no   Duration:  5 days Progression:  Worsening Pain details:    Severity:  Moderate   Duration:  5 days   Timing:  Constant   Progression:  Worsening Chronicity:  New Relieved by:  Nothing Exacerbated by: Palpation and ambulation. Ineffective treatments: NSAIDs, Tylenol, PCN. Associated symptoms: no fever   Risk factors: no prior abscess     Past Medical History:  Diagnosis Date  . Anxiety   . Bronchitis   . Chlamydia   . Cholecystitis   . Depression   . H. pylori infection   . Migraine     Patient Active Problem List   Diagnosis Date Noted  . Acute esophagitis 03/14/2014  . Cholestasis of pregnancy in third trimester 03/06/2014  . Trichomonal infection 03/06/2014  . Yeast infection of the vagina 03/04/2014  . Anxiety in pregnancy in second trimester, antepartum 12/10/2013  . Depression complicating pregnancy in second trimester, antepartum 12/10/2013  . Ultrasound recheck of fetal pyelectasis, antepartum 12/07/2013  . Low grade squamous intraepithelial lesion (LGSIL) on Pap smear 11/29/2013  . Marijuana use 11/22/2013  . Late prenatal care complicating pregnancy in second trimester 11/18/2013    Past Surgical History:  Procedure Laterality Date  . INDUCED ABORTION    . NO PAST SURGERIES    . THERAPEUTIC ABORTION       OB History    Gravida  6   Para  3   Term  3   Preterm      AB  3   Living  3     SAB  0   TAB  3   Ectopic      Multiple      Live Births  3             Home Medications    Prior to Admission medications   Medication Sig Start Date End Date Taking? Authorizing Provider  cyclobenzaprine (FLEXERIL) 10 MG tablet Take 1 tablet (10 mg total) by mouth at bedtime and may repeat dose one time if needed. 04/15/17   Recardo Evangelist, PA-C  HYDROcodone-acetaminophen (NORCO/VICODIN) 5-325 MG tablet Take 1 tablet by mouth every 6 (six) hours as needed for severe pain. 03/06/19   Antonietta Breach, PA-C  methocarbamol (ROBAXIN) 500 MG tablet Take 1 tablet (500 mg total) by mouth at bedtime as needed. 01/26/18   Avie Echevaria B, PA-C  naproxen (NAPROSYN) 500 MG tablet Take 1 tablet (500 mg total) by mouth 2 (two) times daily with a meal. 01/26/18   Emeline General, PA-C  oseltamivir (TAMIFLU) 75 MG capsule Take 1 capsule (75 mg total) by mouth every 12 (twelve) hours. 04/15/17   Recardo Evangelist, PA-C  sulfamethoxazole-trimethoprim (BACTRIM DS,SEPTRA DS) 800-160 MG tablet Take 1 tablet by mouth 2 (two) times daily for 7 days. 03/06/19 03/13/19  Antonietta Breach, PA-C    Family History Family History  Problem Relation Age of Onset  . Down syndrome Sister     Social History Social History  Tobacco Use  . Smoking status: Current Every Day Smoker    Types: Cigarettes    Last attempt to quit: 01/08/2014    Years since quitting: 5.1  . Smokeless tobacco: Never Used  Substance Use Topics  . Alcohol use: Yes    Comment: occ  . Drug use: No     Allergies   Ambien [zolpidem] and Decadron [dexamethasone]   Review of Systems Review of Systems  Constitutional: Negative for fever.  Skin:       +abscess  Ten systems reviewed and are negative for acute change, except as noted in the HPI.    Physical Exam Updated Vital Signs BP 125/71 (BP Location: Right Arm)   Pulse (!) 107   Temp 98.1 F (36.7 C) (Oral)   Resp 16   Ht 5' (1.524 m)   Wt 63.5 kg   SpO2 100%   BMI 27.34 kg/m   Physical Exam Vitals signs and nursing note reviewed.   Constitutional:      General: She is not in acute distress.    Appearance: She is well-developed. She is not diaphoretic.     Comments: Nontoxic appearing. Tearful. Anxious.  HENT:     Head: Normocephalic and atraumatic.  Eyes:     General: No scleral icterus.    Conjunctiva/sclera: Conjunctivae normal.  Neck:     Musculoskeletal: Normal range of motion.  Pulmonary:     Effort: Pulmonary effort is normal. No respiratory distress.     Comments: Respirations even and unlabored Genitourinary:    Comments: Swelling and fluctuance to the left of the intergluteal cleft. No apparent communication with the perianal region. No significant erythema or heat to touch. Musculoskeletal: Normal range of motion.  Skin:    General: Skin is warm and dry.     Coloration: Skin is not pale.     Findings: No rash.  Neurological:     Mental Status: She is alert and oriented to person, place, and time.     Coordination: Coordination normal.     Comments: Ambulatory with steady gait.  Psychiatric:        Behavior: Behavior normal.      ED Treatments / Results  Labs (all labs ordered are listed, but only abnormal results are displayed) Labs Reviewed - No data to display  EKG None  Radiology No results found.  Procedures Procedures (including critical care time)  INCISION AND DRAINAGE Performed by: Antonietta Breach Consent: Verbal consent obtained. Risks and benefits: risks, benefits and alternatives were discussed Type: abscess  Body area: L gluteal cleft  Anesthesia: local infiltration  Incision was made with a scalpel.  Local anesthetic: lidocaine 2% with epinephrine  Anesthetic total: 4 ml  Complexity: complex Blunt dissection to break up loculations  Drainage: purulent  Drainage amount: 3cc  Packing material: 1/4 in iodoform gauze  Patient tolerance: Patient tolerated the procedure well with no immediate complications.     Medications Ordered in ED Medications   oxyCODONE-acetaminophen (PERCOCET/ROXICET) 5-325 MG per tablet 2 tablet (2 tablets Oral Given 03/06/19 2227)  lidocaine-EPINEPHrine (XYLOCAINE W/EPI) 2 %-1:200000 (PF) injection 10 mL (10 mLs Other Given 03/06/19 2228)    11:06 PM [place] substance database reviewed.  No narcotic prescriptions filled since August 2019.   Initial Impression / Assessment and Plan / ED Course  I have reviewed the triage vital signs and the nursing notes.  Pertinent labs & imaging results that were available during my care of the patient were reviewed by me  and considered in my medical decision making (see chart for details).        Patient with skin abscess amenable to incision and drainage.  Abscess packed with 1/4" iodoform; wound recheck in 2 days for packing removal. Encouraged home warm soaks and flushing.  Mild signs of cellulitis is surrounding skin.  Will d/c to home with Bactrim.  Return precautions discussed and provided. Patient discharged in stable condition with no unaddressed concerns.   Final Clinical Impressions(s) / ED Diagnoses   Final diagnoses:  Abscess, gluteal cleft    ED Discharge Orders         Ordered    sulfamethoxazole-trimethoprim (BACTRIM DS,SEPTRA DS) 800-160 MG tablet  2 times daily     03/06/19 2303    HYDROcodone-acetaminophen (NORCO/VICODIN) 5-325 MG tablet  Every 6 hours PRN     03/06/19 2303           Antonietta Breach, Hershal Coria 03/06/19 2306    Julianne Rice, MD 03/13/19 [phone]

## 2019-03-06 NOTE — ED Notes (Signed)
ED Provider at bedside. 

## 2019-03-06 NOTE — Discharge Instructions (Addendum)
Take Bactrim as prescribed until finished.  Apply warm compresses 3-4 times per day for 15 to 20 minutes each time.  Have your wound rechecked in 48 hours to ensure that it is healing properly and to have your packing removed.  You may take 600 mg ibuprofen/Motrin/Advil every 6 hours for management of pain.  If pain is severe, you may take Norco as prescribed.  Do not drive or drink alcohol after taking this medication as it may make you drowsy and impair your judgment.  You may return for new or concerning symptoms.

## 2019-03-06 NOTE — ED Notes (Signed)
Patient verbalized understanding of discharge instructions. Opportunities for questioning and answers were provided. Armband removed by staff. Patient removed from ED.   

## 2019-03-06 NOTE — ED Triage Notes (Signed)
Reports abscess to left inner butt cheek for the last five days.  Currently on penicillin for a bad tooth.

## 2020-01-01 NOTE — L&D Delivery Note (Signed)
OB/[facility]N Faculty Practice Delivery Note  Krista Vaughn is a 34 y.o. [id number] s/p NSVD at [redacted]w[redacted]d. She was admitted for SOL.   ROM: 2h 39m with clear fluid GBS Status: Negative Maximum Maternal Temperature: 98.9  Labor Progress: . Pt arrived in MAU in active labor, got her epidural and progressed to complete and +2.  Delivery Date/Time: 09/12/20 at 1104 Delivery: Called to room and patient was complete. She pushed through 2 contractions. Head delivered LOA. Loose nuchal cord present. Shoulder and body delivered in usual fashion. Infant with spontaneous cry, placed on mother's abdomen, dried and stimulated. Cord clamped x 2 after 1-minute delay, and cut by patient's mother-in-law. Cord blood drawn. Placenta delivered spontaneously, intact, with 3-vessel cord. Fundus firm with massage and Pitocin. Labia, perineum, vagina, and cervix inspected, bleeding first degree laceration found and repaired with 3.0 vicryl rapide.   Placenta: Intact Complications: None Lacerations: First degree EBL: 150 Analgesia: Epidural  Postpartum Planning [x]  transfer orders to MB [x]  discharge summary started & shared [x]  message to sent to schedule follow-up  [x]  lists updated [x]  vaccines UTD  Infant: Girl  APGARs 8/9  Pending  Gaylan Gerold, CNM, [place] Certified Nurse Midwife, [facility] for [employer], [facility] 09/12/2020, 11:56 AM

## 2020-02-16 ENCOUNTER — Ambulatory Visit: Payer: Medicaid Other

## 2020-02-22 ENCOUNTER — Encounter (HOSPITAL_COMMUNITY): Payer: Self-pay | Admitting: Obstetrics & Gynecology

## 2020-02-22 ENCOUNTER — Inpatient Hospital Stay (HOSPITAL_COMMUNITY)
Admission: AD | Admit: 2020-02-22 | Discharge: 2020-02-23 | Disposition: A | Discharge status: Home / Self Care | Payer: Medicaid Other | Source: Ambulatory Visit | Attending: Obstetrics & Gynecology | Admitting: Obstetrics & Gynecology

## 2020-02-22 DIAGNOSIS — Z87891 Personal history of nicotine dependence: Secondary | ICD-10-CM | POA: Diagnosis not present

## 2020-02-22 DIAGNOSIS — O99611 Diseases of the digestive system complicating pregnancy, first trimester: Secondary | ICD-10-CM | POA: Insufficient documentation

## 2020-02-22 DIAGNOSIS — Z79899 Other long term (current) drug therapy: Secondary | ICD-10-CM | POA: Diagnosis not present

## 2020-02-22 DIAGNOSIS — K6289 Other specified diseases of anus and rectum: Secondary | ICD-10-CM | POA: Diagnosis not present

## 2020-02-22 DIAGNOSIS — K5900 Constipation, unspecified: Secondary | ICD-10-CM

## 2020-02-22 DIAGNOSIS — Z791 Long term (current) use of non-steroidal anti-inflammatories (NSAID): Secondary | ICD-10-CM | POA: Insufficient documentation

## 2020-02-22 DIAGNOSIS — Z3A09 9 weeks gestation of pregnancy: Secondary | ICD-10-CM | POA: Insufficient documentation

## 2020-02-22 LAB — URINALYSIS, ROUTINE W REFLEX MICROSCOPIC
Bilirubin Urine: NEGATIVE
Glucose, UA: NEGATIVE mg/dL
Hgb urine dipstick: NEGATIVE
Ketones, ur: NEGATIVE mg/dL
Leukocytes,Ua: NEGATIVE
Nitrite: NEGATIVE
Protein, ur: NEGATIVE mg/dL
Specific Gravity, Urine: 1.014 (ref 1.005–1.030)
pH: 7 (ref 5.0–8.0)

## 2020-02-22 LAB — POCT PREGNANCY, URINE: Preg Test, Ur: POSITIVE — AB

## 2020-02-22 MED ORDER — LIDOCAINE HCL URETHRAL/MUCOSAL 2 % EX GEL
1.0000 "application " | Freq: Once | CUTANEOUS | Status: AC
  Administered 2020-02-23: 1 via TOPICAL
  Filled 2020-02-22: qty 5

## 2020-02-22 NOTE — MAU Note (Signed)
PT SAYS AT 8 PM - HAD  BM- ROUND BALL-  THEN FELT SOMETHING COME OUT.  NO VAG BLEEDING.  LAST NL BM - EARLY LAST WEEK .  HAD PREG TEST ON  Krista Vaughn

## 2020-02-23 ENCOUNTER — Ambulatory Visit (INDEPENDENT_AMBULATORY_CARE_PROVIDER_SITE_OTHER): Payer: Medicaid Other | Admitting: Obstetrics and Gynecology

## 2020-02-23 ENCOUNTER — Other Ambulatory Visit: Payer: Self-pay

## 2020-02-23 ENCOUNTER — Other Ambulatory Visit (HOSPITAL_COMMUNITY)
Admission: RE | Admit: 2020-02-23 | Discharge: 2020-02-23 | Disposition: A | Discharge status: Home / Self Care | Payer: Medicaid Other | Source: Ambulatory Visit | Attending: Obstetrics and Gynecology | Admitting: Obstetrics and Gynecology

## 2020-02-23 ENCOUNTER — Encounter: Payer: Self-pay | Admitting: Obstetrics and Gynecology

## 2020-02-23 VITALS — BP 105/70 | HR 86 | Wt 149.0 lb

## 2020-02-23 DIAGNOSIS — O210 Mild hyperemesis gravidarum: Secondary | ICD-10-CM

## 2020-02-23 DIAGNOSIS — Z3480 Encounter for supervision of other normal pregnancy, unspecified trimester: Secondary | ICD-10-CM

## 2020-02-23 DIAGNOSIS — K5900 Constipation, unspecified: Secondary | ICD-10-CM

## 2020-02-23 DIAGNOSIS — Z3481 Encounter for supervision of other normal pregnancy, first trimester: Secondary | ICD-10-CM | POA: Diagnosis not present

## 2020-02-23 DIAGNOSIS — Z3A09 9 weeks gestation of pregnancy: Secondary | ICD-10-CM

## 2020-02-23 DIAGNOSIS — Z3490 Encounter for supervision of normal pregnancy, unspecified, unspecified trimester: Secondary | ICD-10-CM | POA: Insufficient documentation

## 2020-02-23 DIAGNOSIS — Z3A10 10 weeks gestation of pregnancy: Secondary | ICD-10-CM

## 2020-02-23 DIAGNOSIS — K6289 Other specified diseases of anus and rectum: Secondary | ICD-10-CM

## 2020-02-23 DIAGNOSIS — L918 Other hypertrophic disorders of the skin: Secondary | ICD-10-CM

## 2020-02-23 DIAGNOSIS — O99611 Diseases of the digestive system complicating pregnancy, first trimester: Secondary | ICD-10-CM

## 2020-02-23 MED ORDER — FAMOTIDINE 20 MG PO TABS: 20.0000 mg | ORAL_TABLET | Freq: Two times a day (BID) | ORAL | 3 refills | Status: DC

## 2020-02-23 MED ORDER — BLOOD PRESSURE MONITOR KIT: 1.0000 | PACK | 0 refills | Status: AC

## 2020-02-23 MED ORDER — GOJJI WEIGHT SCALE MISC: 1.0000 | Freq: Once | 0 refills | Status: AC

## 2020-02-23 MED ORDER — PROMETHAZINE HCL 25 MG PO TABS: 25.0000 mg | ORAL_TABLET | Freq: Four times a day (QID) | ORAL | 3 refills | Status: DC | PRN

## 2020-02-23 MED ORDER — DOCUSATE SODIUM 100 MG PO CAPS: 100.0000 mg | ORAL_CAPSULE | Freq: Every day | ORAL | 0 refills | Status: DC

## 2020-02-23 NOTE — MAU Provider Note (Signed)
History     CSN: [id number]  Arrival date and time: 02/22/20 2253   First Provider Initiated Contact with Patient 02/22/20 2341      Chief Complaint  Patient presents with  . Rectal Pain   Krista Vaughn is a 34 y.o. G7P3 at [redacted]w[redacted]d who presents to MAU with complaints of rectal pain. She reports having a small BM around 2000 this evening, which was painful and difficult. Reports passing one small stool then wiped and noticed "something was coming out". She reports being constipated over the past week and been straining trying to have BM. Reports last "real" BM was beginning of last week.   She denies abdominal pain, vaginal discharge, vaginal bleeding, urinary symptoms, nausea or vomiting. "wanted to make sure baby was okay with how much I was straining". Reports she was straining to the point she got a HA and body was clinched in ball.   OB History    Gravida  7   Para  3   Term  3   Preterm      AB  3   Living  3     SAB  0   TAB  3   Ectopic      Multiple      Live Births  3           Past Medical History:  Diagnosis Date  . Anxiety   . Bronchitis   . Chlamydia   . Cholecystitis   . Depression   . H. pylori infection   . Migraine     Past Surgical History:  Procedure Laterality Date  . INDUCED ABORTION    . NO PAST SURGERIES    . THERAPEUTIC ABORTION      Family History  Problem Relation Age of Onset  . Down syndrome Sister     Social History   Tobacco Use  . Smoking status: Former Smoker    Types: Cigarettes    Quit date: 01/08/2014    Years since quitting: 6.1  . Smokeless tobacco: Never Used  Substance Use Topics  . Alcohol use: Yes    Comment: occ  . Drug use: No    Allergies:  Allergies  Allergen Reactions  . Ambien [Zolpidem] Other (See Comments)    Pt reports intolerance due to dizziness; prefers not to take.   . Decadron [Dexamethasone] Other (See Comments)    Makes her skin crawl    Medications Prior to Admission   Medication Sig Dispense Refill Last Dose  . cyclobenzaprine (FLEXERIL) 10 MG tablet Take 1 tablet (10 mg total) by mouth at bedtime and may repeat dose one time if needed. 10 tablet 0   . HYDROcodone-acetaminophen (NORCO/VICODIN) 5-325 MG tablet Take 1 tablet by mouth every 6 (six) hours as needed for severe pain. 6 tablet 0   . methocarbamol (ROBAXIN) 500 MG tablet Take 1 tablet (500 mg total) by mouth at bedtime as needed. 20 tablet 0   . naproxen (NAPROSYN) 500 MG tablet Take 1 tablet (500 mg total) by mouth 2 (two) times daily with a meal. 30 tablet 0   . oseltamivir (TAMIFLU) 75 MG capsule Take 1 capsule (75 mg total) by mouth every 12 (twelve) hours. 9 capsule 0     Review of Systems  Constitutional: Negative.   Respiratory: Negative.   Cardiovascular: Negative.   Gastrointestinal: Positive for constipation and rectal pain. Negative for abdominal pain, diarrhea, nausea and vomiting.  Genitourinary: Negative.   Musculoskeletal: Negative.  Neurological: Negative.    Physical Exam   Blood pressure (!) 110/59, pulse (!) 101, temperature 98.7 F (37.1 C), temperature source Oral, resp. rate (!) 22, last menstrual period 12/15/2019.  Physical Exam  Nursing note and vitals reviewed. Constitutional: She is oriented to person, place, and time. She appears well-developed and well-nourished. She appears distressed.  Patient in curled in fetal position d/t rectal pain  Cardiovascular: Normal rate and regular rhythm.  Respiratory: Effort normal and breath sounds normal. No respiratory distress. She has no wheezes.  GI: Soft. She exhibits no distension. There is no abdominal tenderness. There is no rebound and no guarding.  Genitourinary: Rectum:     Tenderness present.     No anal fissure, external hemorrhoid or internal hemorrhoid.   Musculoskeletal:        General: No edema. Normal range of motion.  Neurological: She is alert and oriented to person, place, and time.  Psychiatric:  She has a normal mood and affect. Her behavior is normal. Thought content normal.   MAU Course  Procedures  MDM Lidocaine jelly to numb the rectum  Soap suds enema   Reassessment after enema: Patient reports relief after BM, reports having large BM and feeling "much better". Patient reports that she is still concerned about baby d/t straining. Bedside US completed to ease patient's concern.   Pt informed that the ultrasound is considered a limited OB ultrasound and is not intended to be a complete ultrasound exam.  Patient also informed that the ultrasound is not being completed with the intent of assessing for fetal or placental anomalies or any pelvic abnormalities.  Explained that the purpose of today's ultrasound is to assess for  viability.  Patient acknowledges the purpose of the exam and the limitations of the study.    FHR 160 by bedside US   Discussed with patient importance of decreasing amount of carbs in diet that can cause constipation, daily stool softener and increasing amount of water she consumes. Rx for colace sent to pharmacy of choice.   Discussed reasons to return to MAU. Follow up as scheduled in the office today for NOB appointment. Return to MAU as needed. Pt stable at time of discharge.   Assessment and Plan   1. Constipation during pregnancy in first trimester   2. Pain, rectal    Discharge home Follow up as scheduled in the office for prenatal care Return to MAU as needed for reasons discussed and/or emergencies  Rx for Colace  Follow-up Information    [facility] Follow up.   Specialty: Obstetrics and Gynecology Why: Return to MAU as needed for emergencies  Contact information: [address] [id number] [place] [phone]         Allergies as of 02/23/2020      Reactions   Ambien [zolpidem] Other (See Comments)   Pt reports intolerance due to dizziness; prefers not to take.    Decadron  [dexamethasone] Other (See Comments)   Makes her skin crawl      Medication List    STOP taking these medications   cyclobenzaprine 10 MG tablet Commonly known as: FLEXERIL   HYDROcodone-acetaminophen 5-325 MG tablet Commonly known as: NORCO/VICODIN   methocarbamol 500 MG tablet Commonly known as: ROBAXIN   naproxen 500 MG tablet Commonly known as: NAPROSYN   oseltamivir 75 MG capsule Commonly known as: TAMIFLU     TAKE these medications   docusate sodium 100 MG capsule Commonly known as:  Colace Take 1 capsule (100 mg total) by mouth daily.       Lajean Manes CNM 02/23/2020, 1:07 AM

## 2020-02-23 NOTE — Progress Notes (Signed)
NOB   Genetic Testing Desired.  FLU Vaccine: declined.   CC: itching all over her skin pt notes having dry skin. Recent ER visit due to severe constipation Nausea and vomiting wants Rx.   Last pap: 01/2019 at [facility] per pt pt notes Hx of HPV in the past.

## 2020-02-23 NOTE — Addendum Note (Signed)
Addended by: Courtney Heys on: 02/23/2020 09:50 AM   Modules accepted: Orders

## 2020-02-23 NOTE — MAU Note (Signed)
Soap suds enema was given to patient, she had good results.

## 2020-02-23 NOTE — Progress Notes (Signed)
INITIAL OBSTETRICAL VISIT Patient name: Krista Vaughn MRN [id number]  Date of birth: [birth year] Chief Complaint:   Initial Prenatal Visit  History of Present Illness:   Krista Vaughn is a 34 y.o. [id number] African American female at [redacted]w[redacted]d by LMP with an Estimated Date of Delivery: 09/20/20 being seen today for her initial obstetrical visit.  Her obstetrical history is significant for none. She has a history of 3 vaginal deliveries without complications; all males (ages 64, 6 & 44). She has 2 step-daughters as well (ages 28 & 51). This is a planned pregnancy. She and the father of the baby (FOB) live together. She has a support system that consists of  The FOB, her mother/other children/friends. Today she reports nausea, vomiting and constipation. She was seen in MAU last night for constipation that was relieved with an enema . She now has stool softener to take everyday and she has started eating more fruits and trying to drink more water.   Patient's last menstrual period was 12/15/2019. Last pap February or March of 2020 at [facility]. Results were: normal Review of Systems:   Pertinent items are noted in HPI Denies cramping/contractions, leakage of fluid, vaginal bleeding, abnormal vaginal discharge w/ itching/odor/irritation, headaches, visual changes, shortness of breath, chest pain, abdominal pain, severe nausea/vomiting, or problems with urination or bowel movements unless otherwise stated above.  Pertinent History Reviewed:  Reviewed past medical,surgical, social, obstetrical and family history.  Reviewed problem list, medications and allergies. OB History  Gravida Para Term Preterm AB Living  6 3 3   2 3   SAB TAB Ectopic Multiple Live Births  0 2     3    # Outcome Date GA Lbr Len/2nd Weight Sex Delivery Anes PTL Lv  6 Current           5 Term 04/04/14 [redacted]w[redacted]d 06:36 / 00:11 6 lb 5.9 oz (2.89 kg) M Vag-Spont EPI, Local  LIV  4 TAB 08/2012          3 Term 2007 [redacted]w[redacted]d   M Vag-Spont    LIV  2 Term 2006 [redacted]w[redacted]d   M Vag-Spont   LIV     Birth Comments: No complications   1 TAB            Physical Assessment:   Vitals:   02/23/20 0828  BP: 105/70  Pulse: 86  Weight: 149 lb (67.6 kg)  Body mass index is 29.1 kg/m.       Physical Examination:  General appearance - well appearing, and in no distress  Mental status - alert, oriented to person, place, and time  Psych:  She has a normal mood and affect  Skin - warm and dry, normal color, no suspicious lesions noted  Chest - effort normal, all lung fields clear to auscultation bilaterally  Heart - normal rate and regular rhythm  Abdomen - soft, nontender  Extremities:  No swelling or varicosities noted  Pelvic - VULVA: normal appearing vulva with no masses, tenderness or lesions  VAGINA: normal appearing vagina with normal color and discharge, no lesions.   CERVIX: normal appearing cervix without discharge or lesions, no CMT  Thin prep pap is done with HR HPV cotesting   Results for orders placed or performed during the hospital encounter of 02/22/20 (from the past 24 hour(s))  Urinalysis, Routine w reflex microscopic   Collection Time: 02/22/20 11:13 PM  Result Value Ref Range   Color, Urine YELLOW YELLOW   APPearance CLOUDY (A)  CLEAR   Specific Gravity, Urine 1.014 1.005 - 1.030   pH 7.0 5.0 - 8.0   Glucose, UA NEGATIVE NEGATIVE mg/dL   Hgb urine dipstick NEGATIVE NEGATIVE   Bilirubin Urine NEGATIVE NEGATIVE   Ketones, ur NEGATIVE NEGATIVE mg/dL   Protein, ur NEGATIVE NEGATIVE mg/dL   Nitrite NEGATIVE NEGATIVE   Leukocytes,Ua NEGATIVE NEGATIVE  Pregnancy, urine POC   Collection Time: 02/22/20 11:14 PM  Result Value Ref Range   Preg Test, Ur POSITIVE (A) NEGATIVE    Assessment & Plan:  1) Low-Risk Pregnancy [id number] at [redacted]w[redacted]d with an Estimated Date of Delivery: 09/20/20   2) Initial OB visit - Welcomed to practice and introduced self to patient in addition to discussing other advanced practice providers that  she may be seeing at this practice - Congratulated patient - Anticipatory guidance on upcoming appointments - Educated on [place] and pregnancy and the integration of virtual appointments  - Educated on babys[employer] app- patient reports she has not received email, encouraged to look in spam folder and to call office if she still has not received email - patient verbalizes understanding - Encouraged to sign up for My Chart for video visits, results, review of imaging, messaging RNs and providers   3) Supervision of other normal pregnancy, antepartum - Discussed optimized OB schedule  4) Morning sickness - Rx for Phenergan 25 mg every 6 hours prn N/V - Information provided on morning sickness & eating plan for pregnancy   5) Skin tag - LT gluteal and LT groin area - Ambulatory referral to dermatology  Meds:  Meds ordered this encounter  Medications  . promethazine (PHENERGAN) 25 MG tablet    Sig: Take 1 tablet (25 mg total) by mouth every 6 (six) hours as needed for nausea or vomiting.    Dispense:  30 tablet    Refill:  3    Order Specific Question:   Supervising Provider    Answer:   Donnamae Jude [id number]  . famotidine (PEPCID) 20 MG tablet    Sig: Take 1 tablet (20 mg total) by mouth 2 (two) times daily.    Dispense:  30 tablet    Refill:  3    Order Specific Question:   Supervising Provider    Answer:   Donnamae Jude [id number]    Initial labs obtained Continue prenatal vitamins Reviewed n/v relief measures and warning s/s to report Reviewed recommended weight gain based on pre-gravid BMI Encouraged well-balanced diet Genetic Screening discussed: ordered Cystic fibrosis, SMA, Fragile X screening discussed ordered The nature of [facility] with multiple MDs and other Advanced Practice Providers was explained to patient; also emphasized that residents, students are part of our team.  Discussed optimized OB schedule and video visits. Advised  can have an in-office visit whenever she feels she needs to be seen.  Does not have own BP cuff. BP cuff Rx faxed today. Explained to patient that BP will be mailed to her house. Check BP weekly, let us know if >140/90. Advised to call during normal business hours and there is an after-hours nurse line available.    Follow-up: Return in about 10 weeks (around 05/03/2020) for Return OB - My Chart video.   Orders Placed This Encounter  Procedures  . Culture, OB Urine  . [place] MFM OB COMP + 14 WK  . Obstetric Panel, Including HIV  . Genetic Screening  . Ambulatory referral to Dermatology    Laury Deep MSN, CNM  02/23/2020 

## 2020-02-23 NOTE — Discharge Instructions (Signed)

## 2020-02-23 NOTE — Patient Instructions (Signed)
Eating Plan for Pregnant Women While you are pregnant, your body requires additional nutrition to help support your growing baby. You also have a higher need for some vitamins and minerals, such as folic acid, calcium, iron, and vitamin D. Eating a healthy, well-balanced diet is very important for your health and your baby's health. Your need for extra calories varies for the three 80-monthsegments of your pregnancy (trimesters). For most women, it is recommended to consume:  150 extra calories a day during the first trimester.  300 extra calories a day during the second trimester.  300 extra calories a day during the third trimester. What are tips for following this plan?   Do not try to lose weight or go on a diet during pregnancy.  Limit your overall intake of foods that have "empty calories." These are foods that have little nutritional value, such as sweets, desserts, candies, and sugar-sweetened beverages.  Eat a variety of foods (especially fruits and vegetables) to get a full range of vitamins and minerals.  Take a prenatal vitamin to help meet your additional vitamin and mineral needs during pregnancy, specifically for folic acid, iron, calcium, and vitamin D.  Remember to stay active. Ask your health care provider what types of exercise and activities are safe for you.  Practice good food safety and cleanliness. Wash your hands before you eat and after you prepare raw meat. Wash all fruits and vegetables well before peeling or eating. Taking these actions can help to prevent food-borne illnesses that can be very dangerous to your baby, such as listeriosis. Ask your health care provider for more information about listeriosis. What does 150 extra calories look like? Healthy options that provide 150 extra calories each day could be any of the following:  6-8 oz (170-230 g) of plain low-fat yogurt with  cup of berries.  1 apple with 2 teaspoons (11 g) of peanut butter.  Cut-up  vegetables with  cup (60 g) of hummus.  8 oz (230 mL) or 1 cup of low-fat chocolate milk.  1 stick of string cheese with 1 medium orange.  1 peanut butter and jelly sandwich that is made with one slice of whole-wheat bread and 1 tsp (5 g) of peanut butter. For 300 extra calories, you could eat two of those healthy options each day. What is a healthy amount of weight to gain? The right amount of weight gain for you is based on your BMI before you became pregnant. If your BMI:  Was less than 18 (underweight), you should gain 28-40 lb (13-18 kg).  Was 18-24.9 (normal), you should gain 25-35 lb (11-16 kg).  Was 25-29.9 (overweight), you should gain 15-25 lb (7-11 kg).  Was 30 or greater (obese), you should gain 11-20 lb (5-9 kg). What if I am having twins or multiples? Generally, if you are carrying twins or multiples:  You may need to eat 300-600 extra calories a day.  The recommended range for total weight gain is 25-54 lb (11-25 kg), depending on your BMI before pregnancy.  Talk with your health care provider to find out about nutritional needs, weight gain, and exercise that is right for you. What foods can I eat?  Fruits All fruits. Eat a variety of colors and types of fruit. Remember to wash your fruits well before peeling or eating. Vegetables All vegetables. Eat a variety of colors and types of vegetables. Remember to wash your vegetables well before peeling or eating. Grains All grains. Choose whole grains, such  as whole-wheat bread, oatmeal, or brown rice. Meats and other protein foods Lean meats, including chicken, [place], fish, and lean cuts of beef, veal, or pork. If you eat fish or seafood, choose options that are higher in omega-3 fatty acids and lower in mercury, such as salmon, herring, mussels, trout, sardines, pollock, shrimp, crab, and lobster. Tofu. Tempeh. Beans. Eggs. Peanut butter and other nut butters. Make sure that all meats, poultry, and eggs are cooked to  food-safe temperatures or "well-done." Two or more servings of fish are recommended each week in order to get the most benefits from omega-3 fatty acids that are found in seafood. Choose fish that are lower in mercury. You can find more information online:  GuamGaming.ch Dairy Pasteurized milk and milk alternatives (such as almond milk). Pasteurized yogurt and pasteurized cheese. Cottage cheese. Sour cream. Beverages Water. Juices that contain 100% fruit juice or vegetable juice. Caffeine-free teas and decaffeinated coffee. Drinks that contain caffeine are okay to drink, but it is better to avoid caffeine. Keep your total caffeine intake to less than 200 mg each day (which is 12 oz or 355 mL of coffee, tea, or soda) or the limit as told by your health care provider. Fats and oils Fats and oils are okay to include in moderation. Sweets and desserts Sweets and desserts are okay to include in moderation. Seasoning and other foods All pasteurized condiments. The items listed above may not be a complete list of foods and beverages you can eat. Contact a dietitian for more information. What foods are not recommended? Fruits Unpasteurized fruit juices. Vegetables Raw (unpasteurized) vegetable juices. Meats and other protein foods Lunch meats, bologna, hot dogs, or other deli meats. (If you must eat those meats, reheat them until they are steaming hot.) Refrigerated pat, meat spreads from a meat counter, smoked seafood that is found in the refrigerated section of a store. Raw or undercooked meats, poultry, and eggs. Raw fish, such as sushi or sashimi. Fish that have high mercury content, such as tilefish, shark, swordfish, and king mackerel. To learn more about mercury in fish, talk with your health care provider or look for online resources, such as:  GuamGaming.ch Dairy Raw (unpasteurized) milk and any foods that have raw milk in them. Soft cheeses, such as feta, queso blanco, queso fresco, Brie,  Camembert cheeses, blue-veined cheeses, and Panela cheese (unless it is made with pasteurized milk, which must be stated on the label). Beverages Alcohol. Sugar-sweetened beverages, such as sodas, teas, or energy drinks. Seasoning and other foods Homemade fermented foods and drinks, such as pickles, sauerkraut, or kombucha drinks. (Store-bought pasteurized versions of these are okay.) Salads that are made in a store or deli, such as ham salad, chicken salad, egg salad, tuna salad, and seafood salad. The items listed above may not be a complete list of foods and beverages you should avoid. Contact a dietitian for more information. Where to find more information To calculate the number of calories you need based on your height, weight, and activity level, you can use an online calculator such as:  MobileTransition.ch To calculate how much weight you should gain during pregnancy, you can use an online pregnancy weight gain calculator such as:  StreamingFood.com.cy Summary  While you are pregnant, your body requires additional nutrition to help support your growing baby.  Eat a variety of foods, especially fruits and vegetables to get a full range of vitamins and minerals.  Practice good food safety and cleanliness. Wash your hands before you eat  and after you prepare raw meat. Wash all fruits and vegetables well before peeling or eating. Taking these actions can help to prevent food-borne illnesses, such as listeriosis, that can be very dangerous to your baby.  Do not eat raw meat or fish. Do not eat fish that have high mercury content, such as tilefish, shark, swordfish, and king mackerel. Do not eat unpasteurized (raw) dairy.  Take a prenatal vitamin to help meet your additional vitamin and mineral needs during pregnancy, specifically for folic acid, iron, calcium, and vitamin D. This information is not intended to replace advice given to you by  your health care provider. Make sure you discuss any questions you have with your health care provider. Document Revised: 05/07/2019 Document Reviewed: 09/13/2017 Elsevier Patient Education  [place]. Morning Sickness  Morning sickness is when a woman feels nauseous during pregnancy. This nauseous feeling may or may not come with vomiting. It often occurs in the morning, but it can be a problem at any time of day. Morning sickness is most common during the first trimester. In some cases, it may continue throughout pregnancy. Although morning sickness is unpleasant, it is usually harmless unless the woman develops severe and continual vomiting (hyperemesis gravidarum), a condition that requires more intense treatment. What are the causes? The exact cause of this condition is not known, but it seems to be related to normal hormonal changes that occur in pregnancy. What increases the risk? You are more likely to develop this condition if:  You experienced nausea or vomiting before your pregnancy.  You had morning sickness during a previous pregnancy.  You are pregnant with more than one baby, such as twins. What are the signs or symptoms? Symptoms of this condition include:  Nausea.  Vomiting. How is this diagnosed? This condition is usually diagnosed based on your signs and symptoms. How is this treated? In many cases, treatment is not needed for this condition. Making some changes to what you eat may help to control symptoms. Your health care provider may also prescribe or recommend:  Vitamin B6 supplements.  Anti-nausea medicines.  Ginger. Follow these instructions at home: Medicines  Take over-the-counter and prescription medicines only as told by your health care provider. Do not use any prescription, over-the-counter, or herbal medicines for morning sickness without first talking with your health care provider.  Taking multivitamins before getting pregnant can  prevent or decrease the severity of morning sickness in most women. Eating and drinking  Eat a piece of dry toast or crackers before getting out of bed in the morning.  Eat 5 or 6 small meals a day.  Eat dry and bland foods, such as rice or a baked potato. Foods that are high in carbohydrates are often helpful.  Avoid greasy, fatty, and spicy foods.  Have someone cook for you if the smell of any food causes nausea and vomiting.  If you feel nauseous after taking prenatal vitamins, take the vitamins at night or with a snack.  Snack on protein foods between meals if you are hungry. Nuts, yogurt, and cheese are good options.  Drink fluids throughout the day.  Try ginger ale made with real ginger, ginger tea made from fresh grated ginger, or ginger candies. General instructions  Do not use any products that contain nicotine or tobacco, such as cigarettes and e-cigarettes. If you need help quitting, ask your health care provider.  Get an air purifier to keep the air in your house free of odors.  Get  plenty of fresh air.  Try to avoid odors that trigger your nausea.  Consider trying these methods to help relieve symptoms: ? Wearing an acupressure wristband. These wristbands are often worn for seasickness. ? Acupuncture. Contact a health care provider if:  Your home remedies are not working and you need medicine.  You feel dizzy or light-headed.  You are losing weight. Get help right away if:  You have persistent and uncontrolled nausea and vomiting.  You faint.  You have severe pain in your abdomen. Summary  Morning sickness is when a woman feels nauseous during pregnancy. This nauseous feeling may or may not come with vomiting.  Morning sickness is most common during the first trimester.  It often occurs in the morning, but it can be a problem at any time of day.  In many cases, treatment is not needed for this condition. Making some changes to what you eat may help  to control symptoms. This information is not intended to replace advice given to you by your health care provider. Make sure you discuss any questions you have with your health care provider. Document Revised: 11/29/2017 Document Reviewed: 01/19/2017 Elsevier Patient Education  2020 [employer].

## 2020-02-24 LAB — OBSTETRIC PANEL, INCLUDING HIV
Antibody Screen: NEGATIVE
Basophils Absolute: 0 10*3/uL (ref 0.0–0.2)
Basos: 0 %
EOS (ABSOLUTE): 0.1 10*3/uL (ref 0.0–0.4)
Eos: 1 %
HIV Screen 4th Generation wRfx: NONREACTIVE
Hematocrit: 35.2 % (ref 34.0–46.6)
Hemoglobin: 11.7 g/dL (ref 11.1–15.9)
Hepatitis B Surface Ag: NEGATIVE
Immature Grans (Abs): 0 10*3/uL (ref 0.0–0.1)
Immature Granulocytes: 0 %
Lymphocytes Absolute: 2.2 10*3/uL (ref 0.7–3.1)
Lymphs: 16 %
MCH: 28.4 pg (ref 26.6–33.0)
MCHC: 33.2 g/dL (ref 31.5–35.7)
MCV: 85 fL (ref 79–97)
Monocytes Absolute: 0.6 10*3/uL (ref 0.1–0.9)
Monocytes: 5 %
Neutrophils Absolute: 10.6 10*3/uL — ABNORMAL HIGH (ref 1.4–7.0)
Neutrophils: 78 %
Platelets: 264 10*3/uL (ref 150–450)
RBC: 4.12 x10E6/uL (ref 3.77–5.28)
RDW: 15.1 % (ref 11.7–15.4)
RPR Ser Ql: NONREACTIVE
Rh Factor: POSITIVE
Rubella Antibodies, IGG: 3.07 index (ref >0.99)
WBC: 13.6 10*3/uL — ABNORMAL HIGH (ref 3.4–10.8)

## 2020-02-24 LAB — CERVICOVAGINAL ANCILLARY ONLY
Bacterial Vaginitis (gardnerella): POSITIVE — AB
Candida Glabrata: NEGATIVE
Candida Vaginitis: POSITIVE — AB
Chlamydia: NEGATIVE
Comment: NEGATIVE
Comment: NEGATIVE
Comment: NEGATIVE
Comment: NEGATIVE
Comment: NEGATIVE
Comment: NORMAL
Neisseria Gonorrhea: NEGATIVE
Trichomonas: NEGATIVE

## 2020-02-25 LAB — CYTOLOGY - PAP
Comment: NEGATIVE
Diagnosis: NEGATIVE
High risk HPV: NEGATIVE

## 2020-02-25 LAB — CULTURE, OB URINE

## 2020-02-25 LAB — URINE CULTURE, OB REFLEX

## 2020-02-26 ENCOUNTER — Other Ambulatory Visit: Payer: Self-pay

## 2020-02-26 MED ORDER — METRONIDAZOLE 500 MG PO TABS: 500.0000 mg | ORAL_TABLET | Freq: Two times a day (BID) | ORAL | 0 refills | Status: DC

## 2020-02-26 MED ORDER — TERCONAZOLE 0.8 % VA CREA: 1.0000 | TOPICAL_CREAM | Freq: Every day | VAGINAL | 0 refills | Status: DC

## 2020-02-26 NOTE — Progress Notes (Signed)
Rx to treat BV and yeast sent per protocol.

## 2020-03-02 ENCOUNTER — Encounter: Payer: Self-pay | Admitting: Obstetrics and Gynecology

## 2020-03-08 ENCOUNTER — Encounter: Payer: Self-pay | Admitting: Obstetrics and Gynecology

## 2020-03-15 ENCOUNTER — Telehealth: Payer: Self-pay

## 2020-03-15 NOTE — Telephone Encounter (Signed)
Return call to pt regarding constipation Pt made aware of safe comfort measures while pregnant as listed in NOB packet safe list. Pt voiced understanding and advised if no relief and pain becomes severe to go to MAU.  Pt agreeable.

## 2020-03-21 ENCOUNTER — Inpatient Hospital Stay (HOSPITAL_COMMUNITY)
Admission: AD | Admit: 2020-03-21 | Discharge: 2020-03-21 | Disposition: A | Discharge status: Home / Self Care | Payer: Medicaid Other | Attending: Obstetrics and Gynecology | Admitting: Obstetrics and Gynecology

## 2020-03-21 ENCOUNTER — Encounter (HOSPITAL_COMMUNITY): Payer: Self-pay | Admitting: Obstetrics and Gynecology

## 2020-03-21 ENCOUNTER — Inpatient Hospital Stay (HOSPITAL_COMMUNITY): Payer: Medicaid Other

## 2020-03-21 ENCOUNTER — Other Ambulatory Visit: Payer: Self-pay

## 2020-03-21 DIAGNOSIS — O210 Mild hyperemesis gravidarum: Secondary | ICD-10-CM

## 2020-03-21 DIAGNOSIS — Z888 Allergy status to other drugs, medicaments and biological substances status: Secondary | ICD-10-CM | POA: Diagnosis not present

## 2020-03-21 DIAGNOSIS — Z20822 Contact with and (suspected) exposure to covid-19: Secondary | ICD-10-CM | POA: Diagnosis not present

## 2020-03-21 DIAGNOSIS — O26891 Other specified pregnancy related conditions, first trimester: Secondary | ICD-10-CM | POA: Diagnosis not present

## 2020-03-21 DIAGNOSIS — R109 Unspecified abdominal pain: Secondary | ICD-10-CM | POA: Insufficient documentation

## 2020-03-21 DIAGNOSIS — Z87891 Personal history of nicotine dependence: Secondary | ICD-10-CM | POA: Diagnosis not present

## 2020-03-21 DIAGNOSIS — O26611 Liver and biliary tract disorders in pregnancy, first trimester: Secondary | ICD-10-CM | POA: Insufficient documentation

## 2020-03-21 DIAGNOSIS — R1011 Right upper quadrant pain: Secondary | ICD-10-CM

## 2020-03-21 DIAGNOSIS — Z3A13 13 weeks gestation of pregnancy: Secondary | ICD-10-CM | POA: Diagnosis not present

## 2020-03-21 DIAGNOSIS — J069 Acute upper respiratory infection, unspecified: Secondary | ICD-10-CM | POA: Diagnosis present

## 2020-03-21 DIAGNOSIS — K8020 Calculus of gallbladder without cholecystitis without obstruction: Secondary | ICD-10-CM | POA: Insufficient documentation

## 2020-03-21 LAB — CBC WITH DIFFERENTIAL/PLATELET
Abs Immature Granulocytes: 0.03 10*3/uL (ref 0.00–0.07)
Basophils Absolute: 0 10*3/uL (ref 0.0–0.1)
Basophils Relative: 0 %
Eosinophils Absolute: 0.1 10*3/uL (ref 0.0–0.5)
Eosinophils Relative: 2 %
HCT: 35.8 % — ABNORMAL LOW (ref 36.0–46.0)
Hemoglobin: 11.6 g/dL — ABNORMAL LOW (ref 12.0–15.0)
Immature Granulocytes: 0 %
Lymphocytes Relative: 25 %
Lymphs Abs: 2.3 10*3/uL (ref 0.7–4.0)
MCH: 28.2 pg (ref 26.0–34.0)
MCHC: 32.4 g/dL (ref 30.0–36.0)
MCV: 87.1 fL (ref 80.0–100.0)
Monocytes Absolute: 0.7 10*3/uL (ref 0.1–1.0)
Monocytes Relative: 7 %
Neutro Abs: 6.2 10*3/uL (ref 1.7–7.7)
Neutrophils Relative %: 66 %
Platelets: 227 10*3/uL (ref 150–400)
RBC: 4.11 MIL/uL (ref 3.87–5.11)
RDW: 14.3 % (ref 11.5–15.5)
WBC: 9.3 10*3/uL (ref 4.0–10.5)
nRBC: 0 % (ref 0.0–0.2)

## 2020-03-21 LAB — BASIC METABOLIC PANEL
Anion gap: 9 (ref 5–15)
BUN: 5 mg/dL — ABNORMAL LOW (ref 6–20)
CO2: 23 mmol/L (ref 22–32)
Calcium: 9 mg/dL (ref 8.9–10.3)
Chloride: 103 mmol/L (ref 98–111)
Creatinine, Ser: 0.51 mg/dL (ref 0.44–1.00)
GFR calc Af Amer: >60 mL/min (ref >60)
GFR calc non Af Amer: >60 mL/min (ref >60)
Glucose, Bld: 87 mg/dL (ref 70–99)
Potassium: 3.8 mmol/L (ref 3.5–5.1)
Sodium: 135 mmol/L (ref 135–145)

## 2020-03-21 LAB — URINALYSIS, ROUTINE W REFLEX MICROSCOPIC
Bilirubin Urine: NEGATIVE
Glucose, UA: NEGATIVE mg/dL
Hgb urine dipstick: NEGATIVE
Ketones, ur: NEGATIVE mg/dL
Leukocytes,Ua: NEGATIVE
Nitrite: NEGATIVE
Protein, ur: NEGATIVE mg/dL
Specific Gravity, Urine: 1.018 (ref 1.005–1.030)
pH: 8 (ref 5.0–8.0)

## 2020-03-21 LAB — LIPASE, BLOOD: Lipase: 29 U/L (ref 11–51)

## 2020-03-21 LAB — AMYLASE: Amylase: 86 U/L (ref 28–100)

## 2020-03-21 LAB — SARS CORONAVIRUS 2 (TAT 6-24 HRS): SARS Coronavirus 2: NEGATIVE

## 2020-03-21 MED ORDER — HYDROMORPHONE HCL 1 MG/ML IJ SOLN
1.0000 mg | Freq: Once | INTRAMUSCULAR | Status: AC
  Administered 2020-03-21: 1 mg via INTRAMUSCULAR
  Filled 2020-03-21: qty 1

## 2020-03-21 MED ORDER — PROMETHAZINE HCL 25 MG/ML IJ SOLN: 25.0000 mg | Freq: Once | INTRAMUSCULAR | Status: DC

## 2020-03-21 MED ORDER — PROMETHAZINE HCL 25 MG PO TABS: 25.0000 mg | ORAL_TABLET | Freq: Four times a day (QID) | ORAL | 3 refills | Status: DC | PRN

## 2020-03-21 MED ORDER — PROMETHAZINE HCL 25 MG/ML IJ SOLN
12.5000 mg | Freq: Once | INTRAMUSCULAR | Status: AC
  Administered 2020-03-21: 12.5 mg via INTRAMUSCULAR
  Filled 2020-03-21: qty 1

## 2020-03-21 MED ORDER — OXYCODONE-ACETAMINOPHEN 5-325 MG PO TABS: 2.0000 | ORAL_TABLET | ORAL | 0 refills | Status: DC | PRN

## 2020-03-21 NOTE — MAU Provider Note (Signed)
History     CSN: [id number]  Arrival date and time: 03/21/20 1353   First Provider Initiated Contact with Patient 03/21/20 1518      Chief Complaint  Patient presents with  . Abdominal Pain  . Back Pain  . Emesis  . Nausea  . Diarrhea  . Shortness of Breath  . Nasal Congestion   HPI  Ms. FENDI MEINHARDT is a 34 y.o. [phone] female @ 83w6dwho presents to MAU today with complaint of unable to eat anything x 3 days. She reports RUQ stating it feels like something is stuck in there, not like gas", feels better when she leans forward; rated 9/10, radiating to her back which feels sore. She complains of a migraine x 3 days; rated 10/10. Congestion and sore throat x 3 days, rated 10/10. She has taken Tylenol Cold & Sinus without relief.   Past Medical History:  Diagnosis Date  . Anxiety   . Bronchitis   . Chlamydia   . Cholecystitis   . Depression   . H. pylori infection   . Migraine     Past Surgical History:  Procedure Laterality Date  . INDUCED ABORTION    . NO PAST SURGERIES    . THERAPEUTIC ABORTION      Family History  Problem Relation Age of Onset  . Down syndrome Sister     Social History   Tobacco Use  . Smoking status: Former Smoker    Types: Cigarettes    Quit date: 01/08/2014    Years since quitting: 6.2  . Smokeless tobacco: Never Used  Substance Use Topics  . Alcohol use: Yes    Comment: occ  . Drug use: No    Allergies:  Allergies  Allergen Reactions  . Ambien [Zolpidem] Other (See Comments)    Pt reports intolerance due to dizziness; prefers not to take.   . Decadron [Dexamethasone] Other (See Comments)    Makes her skin crawl    Medications Prior to Admission  Medication Sig Dispense Refill Last Dose  . Blood Pressure Monitor KIT 1 Device by Does not apply route once a week. To be monitored Regularly at home. 1 kit 0   . docusate sodium (COLACE) 100 MG capsule Take 1 capsule (100 mg total) by mouth daily. 30 capsule 0   .  famotidine (PEPCID) 20 MG tablet Take 1 tablet (20 mg total) by mouth 2 (two) times daily. 30 tablet 3   . metroNIDAZOLE (FLAGYL) 500 MG tablet Take 1 tablet (500 mg total) by mouth 2 (two) times daily. 14 tablet 0   . Prenatal Vit-Fe Fumarate-FA (PRENATAL VITAMIN PO) Take by mouth.     . promethazine (PHENERGAN) 25 MG tablet Take 1 tablet (25 mg total) by mouth every 6 (six) hours as needed for nausea or vomiting. 30 tablet 3   . terconazole (TERAZOL 3) 0.8 % vaginal cream Place 1 applicator vaginally at bedtime. Apply nightly for three nights. 20 g 0     Review of Systems  Constitutional: Positive for appetite change and chills.  HENT: Positive for congestion and sore throat.   Eyes: Negative.   Respiratory: Positive for shortness of breath.   Gastrointestinal: Positive for diarrhea, nausea and vomiting.  Endocrine: Negative.   Genitourinary: Negative.   Musculoskeletal: Negative.   Skin: Negative.   Allergic/Immunologic: Negative.   Neurological: Negative.   Hematological: Negative.   Psychiatric/Behavioral: Negative.    Physical Exam   Blood pressure (!) 102/52, pulse 84, temperature 98  F (36.7 C), temperature source Oral, resp. rate 17, height 5' (1.524 m), weight 69.3 kg, last menstrual period 12/15/2019, SpO2 99 %.  Physical Exam  Nursing note and vitals reviewed. Constitutional: She is oriented to person, place, and time. She appears well-developed and well-nourished.  HENT:  Head: Normocephalic and atraumatic.  Eyes: Pupils are equal, round, and reactive to light.  Cardiovascular: Normal rate.  GI: There is abdominal tenderness. There is positive Murphy's sign.  Musculoskeletal:        General: Normal range of motion.     Cervical back: Normal range of motion.  Neurological: She is alert and oriented to person, place, and time.    MAU Course  Procedures  MDM CCUA CBC w/Diff BMP Lipase  Amylase COVID-19 testing  Results for orders placed or performed during  the hospital encounter of 03/21/20 (from the past 24 hour(s))  CBC with Differential/Platelet     Status: Abnormal   Collection Time: 03/21/20  4:11 PM  Result Value Ref Range   WBC 9.3 4.0 - 10.5 K/uL   RBC 4.11 3.87 - 5.11 MIL/uL   Hemoglobin 11.6 (L) 12.0 - 15.0 g/dL   HCT 35.8 (L) 36.0 - 46.0 %   MCV 87.1 80.0 - 100.0 fL   MCH 28.2 26.0 - 34.0 pg   MCHC 32.4 30.0 - 36.0 g/dL   RDW 14.3 11.5 - 15.5 %   Platelets 227 150 - 400 K/uL   nRBC 0.0 0.0 - 0.2 %   Neutrophils Relative % 66 %   Neutro Abs 6.2 1.7 - 7.7 K/uL   Lymphocytes Relative 25 %   Lymphs Abs 2.3 0.7 - 4.0 K/uL   Monocytes Relative 7 %   Monocytes Absolute 0.7 0.1 - 1.0 K/uL   Eosinophils Relative 2 %   Eosinophils Absolute 0.1 0.0 - 0.5 K/uL   Basophils Relative 0 %   Basophils Absolute 0.0 0.0 - 0.1 K/uL   Immature Granulocytes 0 %   Abs Immature Granulocytes 0.03 0.00 - 0.07 K/uL  Lipase, blood     Status: None   Collection Time: 03/21/20  4:11 PM  Result Value Ref Range   Lipase 29 11 - 51 U/L  Amylase     Status: None   Collection Time: 03/21/20  4:11 PM  Result Value Ref Range   Amylase 86 28 - 100 U/L  Basic metabolic panel     Status: Abnormal   Collection Time: 03/21/20  4:11 PM  Result Value Ref Range   Sodium 135 135 - 145 mmol/L   Potassium 3.8 3.5 - 5.1 mmol/L   Chloride 103 98 - 111 mmol/L   CO2 23 22 - 32 mmol/L   Glucose, Bld 87 70 - 99 mg/dL   BUN 5 (L) 6 - 20 mg/dL   Creatinine, Ser 0.51 0.44 - 1.00 mg/dL   Calcium 9.0 8.9 - 10.3 mg/dL   GFR calc non Af Amer >60 >60 mL/min   GFR calc Af Amer >60 >60 mL/min   Anion gap 9 5 - 15  Urinalysis, Routine w reflex microscopic     Status: Abnormal   Collection Time: 03/21/20  4:31 PM  Result Value Ref Range   Color, Urine YELLOW YELLOW   APPearance CLOUDY (A) CLEAR   Specific Gravity, Urine 1.018 1.005 - 1.030   pH 8.0 5.0 - 8.0   Glucose, UA NEGATIVE NEGATIVE mg/dL   Hgb urine dipstick NEGATIVE NEGATIVE   Bilirubin Urine NEGATIVE  NEGATIVE  Ketones, ur NEGATIVE NEGATIVE mg/dL   Protein, ur NEGATIVE NEGATIVE mg/dL   Nitrite NEGATIVE NEGATIVE   Leukocytes,Ua NEGATIVE NEGATIVE    US ABDOMEN LIMITED RUQ  Result Date: 03/21/2020 CLINICAL DATA:  Right upper quadrant pain radiating to the back. Pregnant patient at 13 weeks 6 days gestation. EXAM: ULTRASOUND ABDOMEN LIMITED RIGHT UPPER QUADRANT COMPARISON:  Right upper quadrant ultrasound 03/11/2014 FINDINGS: Gallbladder: Physiologically distended. Echogenic material in the gallbladder likely combination of sludge and stones. No gallbladder wall thickening or pericholecystic fluid. Positive sonographic Percell Miller sign was reported by sonographer. Common bile duct: Diameter: 5 mm, normal. Liver: No focal lesion identified. Within normal limits in parenchymal echogenicity. Portal vein is patent on color Doppler imaging with normal direction of blood flow towards the liver. Other: None. IMPRESSION: 1. Echogenic material in the gallbladder likely combination of sludge and stones. No wall thickening or pericholecystic fluid to suggest acute cholecystitis, however technologist reports positive sonographic Murphy sign. 2. No biliary dilatation. Electronically Signed   By: Keith Rake M.D.   On: 03/21/2020 17:25    *Consult with Dr. Roselie Awkward @ 1840 - notified of patient's complaints, assessments, lab & U/S results, recommended tx plan d/c home, Rx Percocet & Phenergan 12.5 mg, refer to [facility] - ok to d/c home   Assessment and Plan  Gallstones  - Referred to call [facility] for consult and follow-up to gallstones - Information provided on gallstones  - Rx for Percocet 5/325 mg every 6 hours prn pain and Phenergan 25 mg every 6 hours prn N/V  - Advised if pain medication is not controlling pain, F/U with [facility] - Discharge home - Keep scheduled appt with [facility] - Patient verbalized an understanding of the plan of care and agrees.   Laury Deep, MSN,  CNM 03/21/2020, 3:49 PM

## 2020-03-21 NOTE — MAU Provider Note (Addendum)
First Provider Initiated Contact with Patient 03/21/20 1518      S Ms. Krista Vaughn is a 34 y.o. [id number] female @ [redacted]w[redacted]d who presents to MAU today with complaint of unable to eat anything x 3 days. She reports RUQ stating it feels like something is stuck in there, not like gas", feels better when she leans forward; rated 9/10, radiating to her back which feels sore. She complains of a migraine x 3 days; rated 10/10. Congestion and sore throat x 3 days, rated 10/10. She has taken Tylenol Cold & Sinus without relief.  O BP (!) 102/52 (BP Location: Right Arm)   Pulse 84   Temp 98 F (36.7 C) (Oral)   Resp 17   Ht 5' (1.524 m)   Wt 69.3 kg   LMP 12/15/2019   SpO2 99% Comment: room air  BMI 29.82 kg/m  Physical Exam  Nursing note and vitals reviewed. Constitutional: She is oriented to person, place, and time. She appears well-developed and well-nourished.  HENT:  Head: Normocephalic and atraumatic.  Eyes: Pupils are equal, round, and reactive to light.  Cardiovascular: Normal rate.  Respiratory: Effort normal.  Musculoskeletal:        General: Normal range of motion.     Cervical back: Normal range of motion.  Neurological: She is alert and oriented to person, place, and time.  Skin: Skin is warm and dry.  Psychiatric:  Patient leaning forward onto the back of a wheelchair holding her RUQ wencing in pain.   FHTs by doppler: 154 bpm  A [id number] female at [redacted]w[redacted]d  Medical screening exam complete Abdominal Pain  P Discharge from MAU in stable condition Patient given the option of transfer to [facility] for further evaluation or seek care in outpatient facility of choice Report called to Dr. Sabra Heck at [facility] -- ok for transfer RN report called by Jannifer Franklin, RN -- charge RN stated "can't they just do a gallbladder U/S over there? We have 96 patients on our board and 25 are in the waiting room. We can take her, but I just feel bad for the patient to have to wait."   MAU Provider to  see the patient. See 2nd MAU Provider Note.  Laury Deep, CNM 03/21/2020 3:28 PM

## 2020-03-21 NOTE — Discharge Instructions (Signed)
Return to [facility] Emergency Department, if pain is not controlled with pain medication

## 2020-03-21 NOTE — MAU Note (Addendum)
Pt states she started feeling like she couldn't eat anything three days ago. Has a pain in her RUQ stating it "feels like something is stuck there, not like gas" feels better when she leans forward, rating the pain 9/10 that radiates to her back which feels sore. Has had a migraine as well for 3 days rating it a 10/10. Has had congestion and sore throat for three days as well as chills and body ache which she describes as sore and weak. Has been vomiting and having diarrhea (more than five occurrences). States she feels "like it's just a virus". States she took "Tylenol something cold and sinus around 9". Denies pain or burning with urination, denies increased frequency. States she feels cold. Denies LOF/VB/vaginal discharge. Reports shortness of breath but no chest pain.

## 2020-03-22 ENCOUNTER — Telehealth: Payer: Self-pay | Admitting: Obstetrics and Gynecology

## 2020-03-22 NOTE — Telephone Encounter (Signed)
TC to notify patient of Negative COVID-19 results. No answer, no VM left. Patient does not have My Chart. Will send message to [facility]'s clinical Pool to notify patient.  Laury Deep, CNM

## 2020-03-28 ENCOUNTER — Telehealth: Payer: Self-pay

## 2020-03-28 NOTE — Telephone Encounter (Signed)
Pt called and reports that she was at the hospital last week and they diagnosed her with gallstones. Pt reports she had been having pain from the gallstones since 03/15/20 when she called our office and a nurse spoke to her thinking she may be constipated. The pt reports she was out of work due to pain on 3/16, 3/17, 3/18, and 3/19 before she was seen in the hospital. Pt is requesting documentation for her job so that they will excuse her for those days prior to the diagnoses.

## 2020-03-29 NOTE — Progress Notes (Unsigned)
Letter provided for work absences per Dr. Harolyn Rutherford. Pt made aware letter is up front for her to pick up at her convenience.

## 2020-03-29 NOTE — Telephone Encounter (Signed)
Please provide her a letter as requested.  Thank you!  Verita Schneiders, MD

## 2020-04-06 ENCOUNTER — Other Ambulatory Visit: Payer: Self-pay

## 2020-04-06 ENCOUNTER — Encounter (HOSPITAL_COMMUNITY): Payer: Self-pay | Admitting: Obstetrics and Gynecology

## 2020-04-06 ENCOUNTER — Emergency Department (HOSPITAL_COMMUNITY)
Admission: AD | Admit: 2020-04-06 | Discharge: 2020-04-06 | Disposition: A | Discharge status: Home / Self Care | Payer: Medicaid Other | Attending: Emergency Medicine | Admitting: Emergency Medicine

## 2020-04-06 DIAGNOSIS — R109 Unspecified abdominal pain: Secondary | ICD-10-CM | POA: Diagnosis not present

## 2020-04-06 DIAGNOSIS — Z3A16 16 weeks gestation of pregnancy: Secondary | ICD-10-CM | POA: Diagnosis not present

## 2020-04-06 DIAGNOSIS — O99891 Other specified diseases and conditions complicating pregnancy: Secondary | ICD-10-CM | POA: Insufficient documentation

## 2020-04-06 DIAGNOSIS — R509 Fever, unspecified: Secondary | ICD-10-CM | POA: Insufficient documentation

## 2020-04-06 DIAGNOSIS — Z5321 Procedure and treatment not carried out due to patient leaving prior to being seen by health care provider: Secondary | ICD-10-CM | POA: Diagnosis not present

## 2020-04-06 LAB — COMPREHENSIVE METABOLIC PANEL
ALT: 13 U/L (ref 0–44)
AST: 15 U/L (ref 15–41)
Albumin: 3.2 g/dL — ABNORMAL LOW (ref 3.5–5.0)
Alkaline Phosphatase: 41 U/L (ref 38–126)
Anion gap: 10 (ref 5–15)
BUN: <5 mg/dL — ABNORMAL LOW (ref 6–20)
CO2: 19 mmol/L — ABNORMAL LOW (ref 22–32)
Calcium: 8.8 mg/dL — ABNORMAL LOW (ref 8.9–10.3)
Chloride: 109 mmol/L (ref 98–111)
Creatinine, Ser: 0.38 mg/dL — ABNORMAL LOW (ref 0.44–1.00)
GFR calc Af Amer: >60 mL/min (ref >60)
GFR calc non Af Amer: >60 mL/min (ref >60)
Glucose, Bld: 85 mg/dL (ref 70–99)
Potassium: 3.6 mmol/L (ref 3.5–5.1)
Sodium: 138 mmol/L (ref 135–145)
Total Bilirubin: 0.2 mg/dL — ABNORMAL LOW (ref 0.3–1.2)
Total Protein: 6.1 g/dL — ABNORMAL LOW (ref 6.5–8.1)

## 2020-04-06 LAB — CBC
HCT: 35.7 % — ABNORMAL LOW (ref 36.0–46.0)
Hemoglobin: 11.6 g/dL — ABNORMAL LOW (ref 12.0–15.0)
MCH: 29 pg (ref 26.0–34.0)
MCHC: 32.5 g/dL (ref 30.0–36.0)
MCV: 89.3 fL (ref 80.0–100.0)
Platelets: 246 10*3/uL (ref 150–400)
RBC: 4 MIL/uL (ref 3.87–5.11)
RDW: 14.4 % (ref 11.5–15.5)
WBC: 9.5 10*3/uL (ref 4.0–10.5)
nRBC: 0 % (ref 0.0–0.2)

## 2020-04-06 LAB — URINALYSIS, ROUTINE W REFLEX MICROSCOPIC
Bilirubin Urine: NEGATIVE
Glucose, UA: NEGATIVE mg/dL
Hgb urine dipstick: NEGATIVE
Ketones, ur: NEGATIVE mg/dL
Leukocytes,Ua: NEGATIVE
Nitrite: NEGATIVE
Protein, ur: NEGATIVE mg/dL
Specific Gravity, Urine: 1.018 (ref 1.005–1.030)
pH: 7 (ref 5.0–8.0)

## 2020-04-06 LAB — LIPASE, BLOOD: Lipase: 29 U/L (ref 11–51)

## 2020-04-06 LAB — I-STAT BETA HCG BLOOD, ED (MC, WL, AP ONLY): I-stat hCG, quantitative: >2000 m[IU]/mL — ABNORMAL HIGH (ref <5)

## 2020-04-06 MED ORDER — SODIUM CHLORIDE 0.9% FLUSH: 3.0000 mL | Freq: Once | INTRAVENOUS | Status: DC

## 2020-04-06 NOTE — MAU Note (Signed)
Report given to Janett Billow, RN in the ED.  RN made aware of pt complaints, respirations, and call made to general surgery to meet with the patient in the ED.   RN made aware that Vanita Panda is the accepting physician.   Transport took pt to the ED via wheelchair.

## 2020-04-06 NOTE — MAU Note (Signed)
Pt states that she had a fever this morning and has been throwing up.    [facility] and the doctor there called EMS to bring her in for an evaluation.

## 2020-04-06 NOTE — ED Notes (Signed)
Pt up to desk stating that she had her kids at home and she needed to go. Pt seen leaving ED.

## 2020-04-06 NOTE — MAU Provider Note (Signed)
First Provider Initiated Contact with Patient 04/06/20 1556     S Ms. Krista Vaughn is a 34 y.o. [id number] at [redacted]w[redacted]d who presents to MAU via EMS with chief complaint of RUQ pain in the setting of gallstones. She states her current episode of pain, nausea and vomiting began this morning. Most recent PO intake was soup early this afternoon. Pain score is 6/10, constant and described as "pressure". She has not taken medication for this complaint.  She denies lower abdominal pain, vaginal bleeding, abnormal vaginal discharge.  O BP 135/80 (BP Location: Left Arm)   Pulse 88   Temp 98.4 F (36.9 C) (Oral)   Resp (!) 24   LMP 12/15/2019   SpO2 100%    Physical Exam  Nursing note and vitals reviewed. Constitutional: She appears well-developed and well-nourished.  Cardiovascular: Normal rate.  Respiratory: Effort normal and breath sounds normal.  GI: Soft. There is abdominal tenderness in the right upper quadrant. There is guarding and positive Murphy's sign.   A Medical screening exam complete Gallstones diagnosed via formal RUQ ultrasound 03/20 FHT 143, cleared obstetrically S/p Zofran administered by EMS  P Report called to Dr. Burtis Junes Per Dr. Vanita Panda, Gen Surgery paged to assess patient once she arrives in [place], [place] 04/06/2020 4:30 PM

## 2020-04-06 NOTE — ED Triage Notes (Signed)
C/o abdominal pain; stated has issue with gallbladder and gallstones

## 2020-04-18 ENCOUNTER — Ambulatory Visit: Payer: Self-pay | Admitting: General Surgery

## 2020-04-20 ENCOUNTER — Telehealth: Payer: Self-pay

## 2020-04-20 NOTE — Telephone Encounter (Signed)
Return call to pt regarding vm  Pt c/o lower abdominal/ pelvic pain. That is everyday. Pt made aware of round ligament pain sx's and comfort measures Pt offered sooner in office appt to see provider since I noticed pt has not been seen by office since  02/23/20.(NOB) Pt has virtual appt 05/02/20  Pt made aware if pain is unbearable to report to MAU When offered sooner appt to pt to see MD I did not hear anything I asked pt if this is something she would like to do I still did not hear anything and phone was disconnected.

## 2020-04-22 ENCOUNTER — Other Ambulatory Visit (HOSPITAL_COMMUNITY)
Admission: RE | Admit: 2020-04-22 | Discharge: 2020-04-22 | Disposition: A | Discharge status: Home / Self Care | Payer: Medicaid Other | Source: Ambulatory Visit | Attending: General Surgery | Admitting: General Surgery

## 2020-04-22 DIAGNOSIS — Z20822 Contact with and (suspected) exposure to covid-19: Secondary | ICD-10-CM | POA: Diagnosis not present

## 2020-04-22 DIAGNOSIS — Z01812 Encounter for preprocedural laboratory examination: Secondary | ICD-10-CM | POA: Diagnosis not present

## 2020-04-22 LAB — SARS CORONAVIRUS 2 (TAT 6-24 HRS): SARS Coronavirus 2: NEGATIVE

## 2020-04-25 ENCOUNTER — Encounter (HOSPITAL_COMMUNITY): Payer: Self-pay | Admitting: General Surgery

## 2020-04-25 ENCOUNTER — Other Ambulatory Visit: Payer: Self-pay

## 2020-04-25 NOTE — Progress Notes (Signed)
Krista Vaughn denies chest pain or shortness  of breath. Patient tested negative for Covid on 04/23/2020 and has been in quarantine with her family since that time.  Krista Vaughn is 19 weeks 3 days pregnant, last OB appointment was 02/22/2020.

## 2020-04-26 ENCOUNTER — Encounter (HOSPITAL_COMMUNITY)
Admission: RE | Disposition: A | Discharge status: Home / Self Care | Payer: Self-pay | Source: Ambulatory Visit | Attending: General Surgery

## 2020-04-26 ENCOUNTER — Ambulatory Visit (HOSPITAL_COMMUNITY): Payer: Medicaid Other

## 2020-04-26 ENCOUNTER — Ambulatory Visit (HOSPITAL_COMMUNITY): Payer: Medicaid Other | Admitting: Certified Registered Nurse Anesthetist

## 2020-04-26 ENCOUNTER — Encounter (HOSPITAL_COMMUNITY): Payer: Self-pay | Admitting: General Surgery

## 2020-04-26 ENCOUNTER — Ambulatory Visit (HOSPITAL_COMMUNITY)
Admission: RE | Admit: 2020-04-26 | Discharge: 2020-04-26 | Disposition: A | Discharge status: Home / Self Care | Payer: Medicaid Other | Source: Ambulatory Visit | Attending: General Surgery | Admitting: General Surgery

## 2020-04-26 DIAGNOSIS — O99612 Diseases of the digestive system complicating pregnancy, second trimester: Secondary | ICD-10-CM | POA: Insufficient documentation

## 2020-04-26 DIAGNOSIS — Z3A19 19 weeks gestation of pregnancy: Secondary | ICD-10-CM | POA: Insufficient documentation

## 2020-04-26 DIAGNOSIS — Z87891 Personal history of nicotine dependence: Secondary | ICD-10-CM | POA: Insufficient documentation

## 2020-04-26 DIAGNOSIS — Z79899 Other long term (current) drug therapy: Secondary | ICD-10-CM | POA: Diagnosis not present

## 2020-04-26 DIAGNOSIS — O99342 Other mental disorders complicating pregnancy, second trimester: Secondary | ICD-10-CM | POA: Diagnosis not present

## 2020-04-26 DIAGNOSIS — K219 Gastro-esophageal reflux disease without esophagitis: Secondary | ICD-10-CM | POA: Diagnosis not present

## 2020-04-26 DIAGNOSIS — F329 Major depressive disorder, single episode, unspecified: Secondary | ICD-10-CM | POA: Insufficient documentation

## 2020-04-26 DIAGNOSIS — K8010 Calculus of gallbladder with chronic cholecystitis without obstruction: Secondary | ICD-10-CM | POA: Insufficient documentation

## 2020-04-26 HISTORY — DX: Gastro-esophageal reflux disease without esophagitis: K21.9

## 2020-04-26 HISTORY — PX: CHOLECYSTECTOMY: SHX55

## 2020-04-26 HISTORY — DX: Constipation, unspecified: K59.00

## 2020-04-26 SURGERY — LAPAROSCOPIC CHOLECYSTECTOMY
Anesthesia: General | Site: Abdomen

## 2020-04-26 MED ORDER — ACETAMINOPHEN 500 MG PO TABS: 1000.0000 mg | ORAL_TABLET | ORAL | Status: AC

## 2020-04-26 MED ORDER — ACETAMINOPHEN 10 MG/ML IV SOLN
INTRAVENOUS | Status: AC
  Administered 2020-04-26: 15:00:00 1000 mg
  Filled 2020-04-26: qty 100

## 2020-04-26 MED ORDER — PROPOFOL 10 MG/ML IV BOLUS
INTRAVENOUS | Status: DC | PRN
  Administered 2020-04-26: 50 mg via INTRAVENOUS

## 2020-04-26 MED ORDER — PHENYLEPHRINE 40 MCG/ML (10ML) SYRINGE FOR IV PUSH (FOR BLOOD PRESSURE SUPPORT)
PREFILLED_SYRINGE | INTRAVENOUS | Status: DC | PRN
  Administered 2020-04-26 (×2): 80 ug via INTRAVENOUS
  Administered 2020-04-26: 120 ug via INTRAVENOUS
  Administered 2020-04-26 (×2): 80 ug via INTRAVENOUS
  Administered 2020-04-26: 40 ug via INTRAVENOUS

## 2020-04-26 MED ORDER — LACTATED RINGERS IV SOLN: INTRAVENOUS | Status: DC

## 2020-04-26 MED ORDER — CHLORHEXIDINE GLUCONATE CLOTH 2 % EX PADS: 6.0000 | MEDICATED_PAD | Freq: Once | CUTANEOUS | Status: DC

## 2020-04-26 MED ORDER — FENTANYL CITRATE (PF) 100 MCG/2ML IJ SOLN
INTRAMUSCULAR | Status: AC
  Filled 2020-04-26: qty 2

## 2020-04-26 MED ORDER — NEOSTIGMINE METHYLSULFATE 10 MG/10ML IV SOLN
INTRAVENOUS | Status: DC | PRN
  Administered 2020-04-26: 3 mg via INTRAVENOUS

## 2020-04-26 MED ORDER — PROPOFOL 500 MG/50ML IV EMUL
INTRAVENOUS | Status: DC | PRN
  Administered 2020-04-26: 25 ug/kg/min via INTRAVENOUS

## 2020-04-26 MED ORDER — PHENYLEPHRINE HCL-NACL 10-0.9 MG/250ML-% IV SOLN
INTRAVENOUS | Status: DC | PRN
  Administered 2020-04-26: 50 ug/min via INTRAVENOUS

## 2020-04-26 MED ORDER — SUCCINYLCHOLINE CHLORIDE 20 MG/ML IJ SOLN
INTRAMUSCULAR | Status: DC | PRN
  Administered 2020-04-26: 100 mg via INTRAVENOUS

## 2020-04-26 MED ORDER — CEFAZOLIN SODIUM-DEXTROSE 2-4 GM/100ML-% IV SOLN
2.0000 g | INTRAVENOUS | Status: AC
  Administered 2020-04-26: 2 g via INTRAVENOUS

## 2020-04-26 MED ORDER — BUPIVACAINE-EPINEPHRINE 0.25% -1:200000 IJ SOLN
INTRAMUSCULAR | Status: DC | PRN
  Administered 2020-04-26: 30 mL

## 2020-04-26 MED ORDER — FENTANYL CITRATE (PF) 100 MCG/2ML IJ SOLN
25.0000 ug | INTRAMUSCULAR | Status: DC | PRN
  Administered 2020-04-26: 13:00:00 50 ug via INTRAVENOUS
  Administered 2020-04-26 (×3): 25 ug via INTRAVENOUS

## 2020-04-26 MED ORDER — ATROPINE SULFATE 0.4 MG/ML IJ SOLN
INTRAMUSCULAR | Status: DC | PRN
  Administered 2020-04-26: .3 mg via INTRAVENOUS
  Administered 2020-04-26: .1 mg via INTRAVENOUS

## 2020-04-26 MED ORDER — 0.9 % SODIUM CHLORIDE (POUR BTL) OPTIME
TOPICAL | Status: DC | PRN
  Administered 2020-04-26: 1000 mL

## 2020-04-26 MED ORDER — OXYCODONE HCL 5 MG PO TABS
5.0000 mg | ORAL_TABLET | Freq: Four times a day (QID) | ORAL | 0 refills | Status: DC | PRN
Start: 2020-04-26 — End: 2020-08-24

## 2020-04-26 MED ORDER — SODIUM CHLORIDE 0.9 % IR SOLN
Status: DC | PRN
  Administered 2020-04-26: 1000 mL

## 2020-04-26 MED ORDER — HYDROMORPHONE HCL 1 MG/ML IJ SOLN: 0.5000 mg | INTRAMUSCULAR | Status: DC | PRN

## 2020-04-26 MED ORDER — ROCURONIUM BROMIDE 10 MG/ML (PF) SYRINGE
PREFILLED_SYRINGE | INTRAVENOUS | Status: AC
  Filled 2020-04-26: qty 10

## 2020-04-26 MED ORDER — FENTANYL CITRATE (PF) 250 MCG/5ML IJ SOLN
INTRAMUSCULAR | Status: AC
  Filled 2020-04-26: qty 5

## 2020-04-26 MED ORDER — ROCURONIUM BROMIDE 100 MG/10ML IV SOLN
INTRAVENOUS | Status: DC | PRN
  Administered 2020-04-26: 25 mg via INTRAVENOUS

## 2020-04-26 MED ORDER — ACETAMINOPHEN 500 MG PO TABS
ORAL_TABLET | ORAL | Status: AC
  Administered 2020-04-26: 10:00:00 1000 mg via ORAL
  Filled 2020-04-26: qty 2

## 2020-04-26 MED ORDER — LIDOCAINE 2% (20 MG/ML) 5 ML SYRINGE
INTRAMUSCULAR | Status: DC | PRN
  Administered 2020-04-26: 40 mg via INTRAVENOUS

## 2020-04-26 MED ORDER — PROPOFOL 10 MG/ML IV BOLUS
INTRAVENOUS | Status: AC
  Filled 2020-04-26: qty 40

## 2020-04-26 MED ORDER — ONDANSETRON HCL 4 MG/2ML IJ SOLN
INTRAMUSCULAR | Status: DC | PRN
  Administered 2020-04-26: 4 mg via INTRAVENOUS

## 2020-04-26 MED ORDER — SUCCINYLCHOLINE CHLORIDE 200 MG/10ML IV SOSY
PREFILLED_SYRINGE | INTRAVENOUS | Status: AC
  Filled 2020-04-26: qty 10

## 2020-04-26 MED ORDER — OXYCODONE HCL 5 MG PO TABS
ORAL_TABLET | ORAL | Status: AC
  Filled 2020-04-26: qty 1

## 2020-04-26 MED ORDER — ONDANSETRON HCL 4 MG/2ML IJ SOLN
INTRAMUSCULAR | Status: AC
  Filled 2020-04-26: qty 2

## 2020-04-26 MED ORDER — BUPIVACAINE HCL (PF) 0.25 % IJ SOLN
INTRAMUSCULAR | Status: AC
  Filled 2020-04-26: qty 30

## 2020-04-26 MED ORDER — ACETAMINOPHEN 10 MG/ML IV SOLN: 1000.0000 mg | Freq: Four times a day (QID) | INTRAVENOUS | Status: DC

## 2020-04-26 MED ORDER — PHENYLEPHRINE 40 MCG/ML (10ML) SYRINGE FOR IV PUSH (FOR BLOOD PRESSURE SUPPORT)
PREFILLED_SYRINGE | INTRAVENOUS | Status: AC
  Filled 2020-04-26: qty 20

## 2020-04-26 MED ORDER — NEOSTIGMINE METHYLSULFATE 3 MG/3ML IV SOSY
PREFILLED_SYRINGE | INTRAVENOUS | Status: AC
  Filled 2020-04-26: qty 3

## 2020-04-26 MED ORDER — FENTANYL CITRATE (PF) 250 MCG/5ML IJ SOLN
INTRAMUSCULAR | Status: DC | PRN
  Administered 2020-04-26: 50 ug via INTRAVENOUS
  Administered 2020-04-26: 100 ug via INTRAVENOUS
  Administered 2020-04-26 (×2): 25 ug via INTRAVENOUS

## 2020-04-26 MED ORDER — LIDOCAINE 2% (20 MG/ML) 5 ML SYRINGE
INTRAMUSCULAR | Status: AC
  Filled 2020-04-26: qty 5

## 2020-04-26 MED ORDER — OXYCODONE HCL 5 MG PO TABS
5.0000 mg | ORAL_TABLET | Freq: Once | ORAL | Status: AC
  Administered 2020-04-26: 14:00:00 5 mg via ORAL

## 2020-04-26 SURGICAL SUPPLY — 40 items
ADH SKN CLS APL DERMABOND .7 (GAUZE/BANDAGES/DRESSINGS) ×1
APL PRP STRL LF DISP 70% ISPRP (MISCELLANEOUS) ×1
BAG SPEC RTRVL 10 TROC 200 (ENDOMECHANICALS) ×1
BLADE CLIPPER SURG (BLADE) IMPLANT
CANISTER SUCT 3000ML PPV (MISCELLANEOUS) ×3 IMPLANT
CHLORAPREP W/TINT 26 (MISCELLANEOUS) ×3 IMPLANT
CLIP VESOLOCK MED LG 6/CT (CLIP) ×3 IMPLANT
CLIP VESOLOCK XL 6/CT (CLIP) IMPLANT
COVER SURGICAL LIGHT HANDLE (MISCELLANEOUS) ×3 IMPLANT
COVER WAND RF STERILE (DRAPES) ×3 IMPLANT
DERMABOND ADVANCED (GAUZE/BANDAGES/DRESSINGS) ×2
DERMABOND ADVANCED .7 DNX12 (GAUZE/BANDAGES/DRESSINGS) ×1 IMPLANT
ELECT REM PT RETURN 9FT ADLT (ELECTROSURGICAL) ×3
ELECTRODE REM PT RTRN 9FT ADLT (ELECTROSURGICAL) ×1 IMPLANT
GLOVE BIOGEL PI IND STRL 7.0 (GLOVE) ×1 IMPLANT
GLOVE BIOGEL PI INDICATOR 7.0 (GLOVE) ×2
GLOVE SURG SS PI 7.0 STRL IVOR (GLOVE) ×3 IMPLANT
GOWN STRL REUS W/ TWL LRG LVL3 (GOWN DISPOSABLE) ×3 IMPLANT
GOWN STRL REUS W/TWL LRG LVL3 (GOWN DISPOSABLE) ×9
GRASPER SUT TROCAR 14GX15 (MISCELLANEOUS) ×3 IMPLANT
KIT BASIN OR (CUSTOM PROCEDURE TRAY) ×3 IMPLANT
KIT TURNOVER KIT B (KITS) ×3 IMPLANT
NEEDLE 22X1 1/2 (OR ONLY) (NEEDLE) ×3 IMPLANT
NS IRRIG 1000ML POUR BTL (IV SOLUTION) ×3 IMPLANT
PAD ARMBOARD 7.5X6 YLW CONV (MISCELLANEOUS) ×3 IMPLANT
POUCH RETRIEVAL ECOSAC 10 (ENDOMECHANICALS) ×1 IMPLANT
POUCH RETRIEVAL ECOSAC 10MM (ENDOMECHANICALS) ×3
SCISSORS LAP 5X35 DISP (ENDOMECHANICALS) ×3 IMPLANT
SET IRRIG TUBING LAPAROSCOPIC (IRRIGATION / IRRIGATOR) ×3 IMPLANT
SET TUBE SMOKE EVAC HIGH FLOW (TUBING) ×3 IMPLANT
SLEEVE ENDOPATH XCEL 5M (ENDOMECHANICALS) ×6 IMPLANT
SPECIMEN JAR SMALL (MISCELLANEOUS) ×3 IMPLANT
SUT MNCRL AB 4-0 PS2 18 (SUTURE) ×3 IMPLANT
SUT VICRYL 0 UR6 27IN ABS (SUTURE) ×2 IMPLANT
TOWEL GREEN STERILE (TOWEL DISPOSABLE) ×3 IMPLANT
TOWEL GREEN STERILE FF (TOWEL DISPOSABLE) ×3 IMPLANT
TRAY LAPAROSCOPIC MC (CUSTOM PROCEDURE TRAY) ×3 IMPLANT
TROCAR XCEL 12X100 BLDLESS (ENDOMECHANICALS) ×3 IMPLANT
TROCAR XCEL NON-BLD 5MMX100MML (ENDOMECHANICALS) ×3 IMPLANT
WATER STERILE IRR 1000ML POUR (IV SOLUTION) ×3 IMPLANT

## 2020-04-26 NOTE — Progress Notes (Signed)
Post op FHR doppler performed. Lower baseline noted. Dr. Nehemiah Settle of [facility] notified. Orders for repeat of FHR doppler in 45 min.

## 2020-04-26 NOTE — Op Note (Signed)
PATIENT:  Krista Vaughn  34 y.o. female  PRE-OPERATIVE DIAGNOSIS:  CHRONIC CALCULOUS CHOLECYSTITIS  POST-OPERATIVE DIAGNOSIS:  CHRONIC CALCULOUS CHOLECYSTITIS  PROCEDURE:  Procedure(s): LAPAROSCOPIC CHOLECYSTECTOMY  SURGEON:  Surgeon(s): Shaquira Moroz, Arta Bruce, MD  ASSISTANT: none  ANESTHESIA:   local and general  Indications for procedure: [name] is a 34 y.o. female with symptoms of Abdominal pain and Nausea and vomiting consistent with gallbladder disease, Confirmed by Ultrasound.  Description of procedure: The patient was brought into the operative suite, placed supine. Anesthesia was administered with endotracheal tube. Patient was strapped in place and foot board was secured. All pressure points were offloaded by foam padding. The patient was prepped and draped in the usual sterile fashion.  A small incision was made to the left subcostal. A 52mm trocar was inserted into the peritoneal cavity with optical entry. Pneumoperitoneum was applied with high flow low pressure. 2 47mm trocars were placed in the RUQ. A 35mm trocar was placed in the periumbilical space. Marcaine was infused to the subxiphoid space and lateral upper right abdomen in the transversus abdominis plane. Next the patient was placed in reverse trendelenberg. The gallbladder was distended and blue in color.  The gallbladder was retracted cephalad and lateral. The peritoneum was reflected off the infundibulum working lateral to medial. The cystic duct and cystic artery were identified and further dissection revealed a critical view. The cystic duct and cystic artery were doubly clipped and ligated.   The gallbladder was removed off the liver bed with cautery. The Gallbladder was placed in a specimen bag. The gallbladder fossa was irrigated and hemostasis was applied with cautery. The gallbladder was entered and bile and small sediment was removed with suction. The gallbladder was removed via the 49mm trocar.  The fascial defect was closed with interrupted 0 vicryl suture. Pneumoperitoneum was removed, all trocar were removed. All incisions were closed with 4-0 monocryl subcuticular stitch. The patient woke from anesthesia and was brought to PACU in stable condition. All counts were correct  Findings: mild inflammation of gallbladder with large amount of sediment  Specimen: gallbladder  Blood loss: 10 ml  Local anesthesia: 30 ml marcaine  Complications: none  PLAN OF CARE: Discharge to home after PACU  PATIENT DISPOSITION:  PACU - hemodynamically stable.  Images:       Gurney Maxin, M.D. General, Bariatric, & Minimally Invasive Surgery [facility]

## 2020-04-26 NOTE — Transfer of Care (Signed)
Immediate Anesthesia Transfer of Care Note  Patient: Krista Vaughn  Procedure(s) Performed: LAPAROSCOPIC CHOLECYSTECTOMY (N/A Abdomen)  Patient Location: PACU  Anesthesia Type:General  Level of Consciousness: drowsy  Airway & Oxygen Therapy: Patient Spontanous Breathing and Patient connected to face mask oxygen  Post-op Assessment: Report given to RN and Post -op Vital signs reviewed and stable  Post vital signs: Reviewed  Last Vitals:  Vitals Value Taken Time  BP 102/62 04/26/20 1224  Temp 36.9 C 04/26/20 1224  Pulse 70 04/26/20 1231  Resp 22 04/26/20 1231  SpO2 100 % 04/26/20 1231  Vitals shown include unvalidated device data.  Last Pain:  Vitals:   04/26/20 0920  TempSrc:   PainSc: 0-No pain         Complications: No apparent anesthesia complications

## 2020-04-26 NOTE — Anesthesia Postprocedure Evaluation (Signed)
Anesthesia Post Note  Patient: Krista Vaughn  Procedure(s) Performed: LAPAROSCOPIC CHOLECYSTECTOMY (N/A Abdomen)     Patient location during evaluation: PACU Anesthesia Type: General Level of consciousness: awake and alert Pain management: pain level controlled Vital Signs Assessment: post-procedure vital signs reviewed and stable Respiratory status: spontaneous breathing, nonlabored ventilation, respiratory function stable and patient connected to nasal cannula oxygen Cardiovascular status: blood pressure returned to baseline and stable Postop Assessment: no apparent nausea or vomiting Anesthetic complications: no    Last Vitals:  Vitals:   04/26/20 1515 04/26/20 1530  BP: 102/63 102/60  Pulse: 75 85  Resp: (!) 22 18  Temp:  36.9 C  SpO2: 99% 99%    Last Pain:  Vitals:   04/26/20 1530  TempSrc:   PainSc: Tyler Deis

## 2020-04-26 NOTE — Progress Notes (Signed)
Patient is 19 weeks and 3 days. Fetal heart monitoring requested, per Dr. Deatra Canter.  OB Rapid response called and notified.  Per OB Rapid response nurse, she needs orders from patient's OB to perform fetal heart monitoring.  But will do monitoring without OB's orders.  Verbal order put in by [facility], per Dr. Blane Ohara request.

## 2020-04-26 NOTE — Anesthesia Preprocedure Evaluation (Signed)
Anesthesia Evaluation  Patient identified by MRN, date of birth, ID band Patient awake    Reviewed: Allergy & Precautions, NPO status , Patient's Chart, lab work & pertinent test results  Airway Mallampati: II  TM Distance: >3 FB     Dental  (+) Dental Advisory Given   Pulmonary former smoker,    breath sounds clear to auscultation       Cardiovascular negative cardio ROS   Rhythm:Regular Rate:Normal     Neuro/Psych  Headaches,    GI/Hepatic Neg liver ROS, GERD  ,  Endo/Other  negative endocrine ROS  Renal/GU negative Renal ROS     Musculoskeletal   Abdominal   Peds  Hematology negative hematology ROS (+)   Anesthesia Other Findings   Reproductive/Obstetrics (+) Pregnancy                             Anesthesia Physical Anesthesia Plan  ASA: II  Anesthesia Plan: General   Post-op Pain Management:    Induction: Intravenous  PONV Risk Score and Plan: 3 and Dexamethasone, Ondansetron, Treatment may vary due to age or medical condition and Scopolamine patch - Pre-op  Airway Management Planned: Oral ETT  Additional Equipment:   Intra-op Plan:   Post-operative Plan: Extubation in OR  Informed Consent: I have reviewed the patients History and Physical, chart, labs and discussed the procedure including the risks, benefits and alternatives for the proposed anesthesia with the patient or authorized representative who has indicated his/her understanding and acceptance.     Dental advisory given  Plan Discussed with: CRNA  Anesthesia Plan Comments:         Anesthesia Quick Evaluation

## 2020-04-26 NOTE — H&P (Signed)
Krista Vaughn is an 34 y.o. female.   Chief Complaint: abdominal pain HPI: 34 yo pregnant female has had multiple bouts of epigastric abdominal pain. Pain is sometimes associated with certain foods but food changes have not led to improvement.  Past Medical History:  Diagnosis Date  . Anxiety   . Bronchitis   . Chlamydia   . Cholecystitis   . Constipation   . Depression   . GERD (gastroesophageal reflux disease)   . H. pylori infection   . Migraine     Past Surgical History:  Procedure Laterality Date  . INDUCED ABORTION    . NO PAST SURGERIES    . THERAPEUTIC ABORTION      Family History  Problem Relation Age of Onset  . Down syndrome Sister    Social History:  reports that she quit smoking about 6 years ago. Her smoking use included cigarettes. She quit after 4.00 years of use. She has never used smokeless tobacco. She reports previous alcohol use. She reports that she does not use drugs.  Allergies:  Allergies  Allergen Reactions  . Ambien [Zolpidem] Other (See Comments)    Pt reports intolerance due to dizziness; prefers not to take.   . Decadron [Dexamethasone] Other (See Comments)    Makes her skin crawl    Medications Prior to Admission  Medication Sig Dispense Refill  . acetaminophen (TYLENOL) 500 MG tablet Take 500-1,000 mg by mouth every 6 (six) hours as needed (pain.).    Marland Kitchen diphenhydrAMINE (BENADRYL) 25 MG tablet Take 25 mg by mouth every 6 (six) hours as needed (allergies.).    Marland Kitchen docusate sodium (COLACE) 100 MG capsule Take 1 capsule (100 mg total) by mouth daily. 30 capsule 0  . Prenatal Vit-Fe Fumarate-FA (PRENATAL VITAMIN PO) Take 1 tablet by mouth daily.     . Blood Pressure Monitor KIT 1 Device by Does not apply route once a week. To be monitored Regularly at home. 1 kit 0  . famotidine (PEPCID) 20 MG tablet Take 1 tablet (20 mg total) by mouth 2 (two) times daily. (Patient not taking: Reported on 04/14/2020) 30 tablet 3  . oxyCODONE-acetaminophen  (PERCOCET/ROXICET) 5-325 MG tablet Take 2 tablets by mouth every 4 (four) hours as needed for severe pain. (Patient not taking: Reported on 04/14/2020) 24 tablet 0  . promethazine (PHENERGAN) 25 MG tablet Take 1 tablet (25 mg total) by mouth every 6 (six) hours as needed for nausea or vomiting. (Patient not taking: Reported on 04/14/2020) 30 tablet 3  . promethazine (PHENERGAN) 25 MG tablet Take 1 tablet (25 mg total) by mouth every 6 (six) hours as needed for nausea or vomiting. (Patient not taking: Reported on 04/14/2020) 30 tablet 3    No results found for this or any previous visit (from the past 48 hour(s)). No results found.  Review of Systems  Constitutional: Negative for chills and fever.  HENT: Negative for hearing loss.   Respiratory: Negative for cough.   Cardiovascular: Negative for chest pain and palpitations.  Gastrointestinal: Positive for abdominal pain and nausea. Negative for vomiting.  Genitourinary: Negative for dysuria and urgency.  Musculoskeletal: Negative for myalgias and neck pain.  Skin: Negative for rash.  Neurological: Negative for dizziness and headaches.  Hematological: Does not bruise/bleed easily.  Psychiatric/Behavioral: Negative for suicidal ideas.    Blood pressure (!) 103/56, pulse 93, temperature 98.3 F (36.8 C), temperature source Oral, resp. rate 18, height 5' (1.524 m), weight 65.8 kg, last menstrual period 12/15/2019, SpO2  100 %. Physical Exam  Nursing note and vitals reviewed. Constitutional: She is oriented to person, place, and time. She appears well-developed and well-nourished.  HENT:  Head: Normocephalic and atraumatic.  Eyes: Conjunctivae and EOM are normal. No scleral icterus.  Cardiovascular: Normal rate and regular rhythm.  Respiratory: Effort normal and breath sounds normal. She has no wheezes. She has no rales. She exhibits no tenderness.  GI: Soft. She exhibits no distension. There is no abdominal tenderness. There is no rebound.   gravid  Musculoskeletal:        General: No edema. Normal range of motion.     Cervical back: Normal range of motion and neck supple.  Neurological: She is alert and oriented to person, place, and time.  Skin: Skin is warm and dry.  Psychiatric: She has a normal mood and affect. Her behavior is normal.     Assessment/Plan 34 yo female who is in second trimester with chronic calculous cholecystitis leading to significant pain and nausea -lap chole -FHT preop and post op -planned outpatient procedure  Mickeal Skinner, MD 04/26/2020, 10:31 AM

## 2020-04-26 NOTE — Anesthesia Procedure Notes (Signed)
Procedure Name: Intubation Date/Time: 04/26/2020 11:18 AM Performed by: Janene Harvey, CRNA Pre-anesthesia Checklist: Patient identified, Emergency Drugs available, Suction available and Patient being monitored Patient Re-evaluated:Patient Re-evaluated prior to induction Oxygen Delivery Method: Circle system utilized Preoxygenation: Pre-oxygenation with 100% oxygen Induction Type: IV induction and Rapid sequence Laryngoscope Size: Glidescope and 3 Grade View: Grade I Tube type: Oral Tube size: 7.0 mm Number of attempts: 1 Airway Equipment and Method: Stylet and Oral airway Placement Confirmation: ETT inserted through vocal cords under direct vision,  positive ETCO2 and breath sounds checked- equal and bilateral Secured at: 21 cm Tube secured with: Tape Dental Injury: Teeth and Oropharynx as per pre-operative assessment  Comments: Elective glidescope, pregnant.

## 2020-04-27 LAB — SURGICAL PATHOLOGY

## 2020-04-28 ENCOUNTER — Telehealth: Payer: Medicaid Other | Admitting: Certified Nurse Midwife

## 2020-05-02 ENCOUNTER — Telehealth (INDEPENDENT_AMBULATORY_CARE_PROVIDER_SITE_OTHER): Payer: Medicaid Other | Admitting: Obstetrics

## 2020-05-02 ENCOUNTER — Encounter: Payer: Self-pay | Admitting: Obstetrics

## 2020-05-02 DIAGNOSIS — L299 Pruritus, unspecified: Secondary | ICD-10-CM | POA: Diagnosis not present

## 2020-05-02 DIAGNOSIS — O99712 Diseases of the skin and subcutaneous tissue complicating pregnancy, second trimester: Secondary | ICD-10-CM | POA: Diagnosis not present

## 2020-05-02 DIAGNOSIS — Z3480 Encounter for supervision of other normal pregnancy, unspecified trimester: Secondary | ICD-10-CM

## 2020-05-02 DIAGNOSIS — M549 Dorsalgia, unspecified: Secondary | ICD-10-CM | POA: Diagnosis not present

## 2020-05-02 MED ORDER — COMFORT FIT MATERNITY SUPP SM MISC: 0 refills | Status: AC

## 2020-05-02 NOTE — Progress Notes (Signed)
   TELEHEALTH VIRTUAL OBSTETRICS VISIT ENCOUNTER NOTE  I connected with Krista Vaughn on 05/02/20 at  1:45 PM EDT by telephone at home and verified that I am speaking with the correct person using two identifiers.   I discussed the limitations, risks, security and privacy concerns of performing an evaluation and management service by telephone and the availability of in person appointments. I also discussed with the patient that there may be a patient responsible charge related to this service. The patient expressed understanding and agreed to proceed.  Subjective:  Krista Vaughn is a 34 y.o. [phone] at [redacted]w[redacted]d being followed for ongoing prenatal care.  She is currently monitored for the following issues for this low-risk pregnancy and has Marijuana use; Anxiety in pregnancy in second trimester, antepartum; Depression complicating pregnancy in second trimester, antepartum; Supervision of normal pregnancy, antepartum; Upper respiratory infection; Gallstones; and Right upper quadrant abdominal pain of unknown etiology on their problem list.  Patient reports severe pruritus mainly at night.  Taking Benadryl for the itching with some relief.  Had a cholecystectomy done on the March 26th.  . Reports fetal movement. Denies any contractions, bleeding or leaking of fluid.   The following portions of the patient's history were reviewed and updated as appropriate: allergies, current medications, past family history, past medical history, past social history, past surgical history and problem list.   Objective:   General:  Alert, oriented and cooperative.   Mental Status: Normal mood and affect perceived. Normal judgment and thought content.  Rest of physical exam deferred due to type of encounter  Assessment and Plan:  Pregnancy: [id number] at [redacted]w[redacted]d 1. Supervision of other normal pregnancy, antepartum  2. Backache symptom Rx: - Elastic Bandages & Supports (COMFORT FIT MATERNITY SUPP SM) MISC;  Wear as directed.  Dispense: 1 each; Refill: 0  3. Pruritus of pregnancy in second trimester Rx: - Bile acids, total   Preterm labor symptoms and general obstetric precautions including but not limited to vaginal bleeding, contractions, leaking of fluid and fetal movement were reviewed in detail with the patient.  I discussed the assessment and treatment plan with the patient. The patient was provided an opportunity to ask questions and all were answered. The patient agreed with the plan and demonstrated an understanding of the instructions. The patient was advised to call back or seek an in-person office evaluation/go to MAU at [facility] for any urgent or concerning symptoms. Please refer to After Visit Summary for other counseling recommendations.   I provided 10 minutes of non-face-to-face time during this encounter.  Return in about 1 week (around 05/09/2020) for [place].  Check Bile Acids.  Future Appointments  Date Time Provider [place]  05/10/2020  8:45 AM Shelly Bombard, MD [place] None  05/16/2020  9:45 AM [facility] [place], [place] for [facility] [place] [facility] 05/02/2020

## 2020-05-10 ENCOUNTER — Ambulatory Visit (INDEPENDENT_AMBULATORY_CARE_PROVIDER_SITE_OTHER): Payer: Medicaid Other | Admitting: Obstetrics

## 2020-05-10 ENCOUNTER — Encounter: Payer: Medicaid Other | Admitting: Obstetrics

## 2020-05-10 ENCOUNTER — Encounter: Payer: Self-pay | Admitting: Obstetrics

## 2020-05-10 ENCOUNTER — Other Ambulatory Visit: Payer: Self-pay

## 2020-05-10 VITALS — Wt 149.3 lb

## 2020-05-10 DIAGNOSIS — B372 Candidiasis of skin and nail: Secondary | ICD-10-CM

## 2020-05-10 DIAGNOSIS — F329 Major depressive disorder, single episode, unspecified: Secondary | ICD-10-CM

## 2020-05-10 DIAGNOSIS — K219 Gastro-esophageal reflux disease without esophagitis: Secondary | ICD-10-CM

## 2020-05-10 DIAGNOSIS — K029 Dental caries, unspecified: Secondary | ICD-10-CM

## 2020-05-10 DIAGNOSIS — L299 Pruritus, unspecified: Secondary | ICD-10-CM

## 2020-05-10 DIAGNOSIS — Z3480 Encounter for supervision of other normal pregnancy, unspecified trimester: Secondary | ICD-10-CM

## 2020-05-10 DIAGNOSIS — F419 Anxiety disorder, unspecified: Secondary | ICD-10-CM

## 2020-05-10 DIAGNOSIS — F32A Depression, unspecified: Secondary | ICD-10-CM

## 2020-05-10 MED ORDER — OMEPRAZOLE 20 MG PO CPDR: 20.0000 mg | DELAYED_RELEASE_CAPSULE | Freq: Two times a day (BID) | ORAL | 5 refills | Status: DC

## 2020-05-10 MED ORDER — FLUCONAZOLE 150 MG PO TABS
150.0000 mg | ORAL_TABLET | Freq: Once | ORAL | 0 refills | Status: AC
Start: 2020-05-10 — End: 2020-05-10

## 2020-05-10 NOTE — Progress Notes (Signed)
Subjective:  Krista Vaughn is a 34 y.o. [phone] at [redacted]w[redacted]d being seen today for ongoing prenatal care.  She is currently monitored for the following issues for this low-risk pregnancy and has Marijuana use; Anxiety in pregnancy in second trimester, antepartum; Depression complicating pregnancy in second trimester, antepartum; Supervision of normal pregnancy, antepartum; Upper respiratory infection; Gallstones; and Right upper quadrant abdominal pain of unknown etiology on their problem list.  Patient reports heartburn, occasional contractions and toe nail fungus, anxiety / deprassion.   .  .   . Denies leaking of fluid.   The following portions of the patient's history were reviewed and updated as appropriate: allergies, current medications, past family history, past medical history, past social history, past surgical history and problem list. Problem list updated.  Objective:   Vitals:   05/10/20 0858  Weight: 149 lb 4.8 oz (67.7 kg)    Fetal Status:           General:  Alert, oriented and cooperative. Patient is in no acute distress.  Skin: Skin is warm and dry. No rash noted.   Cardiovascular: Normal heart rate noted  Respiratory: Normal respiratory effort, no problems with respiration noted  Abdomen: Soft, gravid, appropriate for gestational age.       Pelvic:  Cervical exam deferred        Extremities: Normal range of motion.     Mental Status: Normal mood and affect. Normal behavior. Normal judgment and thought content.   Urinalysis:      Assessment and Plan:  Pregnancy: [id number] at [redacted]w[redacted]d  1. Supervision of other normal pregnancy, antepartum Rx: - AFP, Serum, Open Spina Bifida  2. Pruritus Rx: - Bile acids, total - Comprehensive metabolic panel  3. Candidiasis of nail Rx: - Ambulatory referral to Podiatry  4. Anxiety and depression Rx: - Ambulatory referral to [place]  5. Pain due to dental caries Rx: - Ambulatory referral to  Dentistry  6. GERD without esophagitis Rx: - omeprazole (PRILOSEC) 20 MG capsule; Take 1 capsule (20 mg total) by mouth 2 (two) times daily before a meal.  Dispense: 60 capsule; Refill: 5   Preterm labor symptoms and general obstetric precautions including but not limited to vaginal bleeding, contractions, leaking of fluid and fetal movement were reviewed in detail with the patient. Please refer to After Visit Summary for other counseling recommendations.    Shelly Bombard, MD  05/10/2020

## 2020-05-10 NOTE — Progress Notes (Signed)
Pt presents for ROB c/o body itching Has hx of cholestasis

## 2020-05-11 ENCOUNTER — Encounter: Payer: Medicaid Other | Admitting: Advanced Practice Midwife

## 2020-05-11 ENCOUNTER — Other Ambulatory Visit: Payer: Self-pay | Admitting: *Deleted

## 2020-05-11 DIAGNOSIS — Z3480 Encounter for supervision of other normal pregnancy, unspecified trimester: Secondary | ICD-10-CM

## 2020-05-11 NOTE — Progress Notes (Signed)
Change in u/s order to detail in order for ins approval.

## 2020-05-12 LAB — AFP, SERUM, OPEN SPINA BIFIDA
AFP MoM: 1.09
AFP Value: 79.9 ng/mL
Gest. Age on Collection Date: 21 weeks
Maternal Age At EDD: 33 yr
OSBR Risk 1 IN: 10000
Test Results:: NEGATIVE
Weight: 149 [lb_av]

## 2020-05-12 LAB — COMPREHENSIVE METABOLIC PANEL
ALT: 10 IU/L (ref 0–32)
AST: 14 IU/L (ref 0–40)
Albumin/Globulin Ratio: 1.6 (ref 1.2–2.2)
Albumin: 4 g/dL (ref 3.8–4.8)
Alkaline Phosphatase: 69 IU/L (ref 39–117)
BUN/Creatinine Ratio: 8 — ABNORMAL LOW (ref 9–23)
BUN: 4 mg/dL — ABNORMAL LOW (ref 6–20)
Bilirubin Total: <0.2 mg/dL (ref 0.0–1.2)
CO2: 20 mmol/L (ref 20–29)
Calcium: 9.1 mg/dL (ref 8.7–10.2)
Chloride: 105 mmol/L (ref 96–106)
Creatinine, Ser: 0.48 mg/dL — ABNORMAL LOW (ref 0.57–1.00)
GFR calc Af Amer: 150 mL/min/{1.73_m2} (ref >59)
GFR calc non Af Amer: 130 mL/min/{1.73_m2} (ref >59)
Globulin, Total: 2.5 g/dL (ref 1.5–4.5)
Glucose: 84 mg/dL (ref 65–99)
Potassium: 4.4 mmol/L (ref 3.5–5.2)
Sodium: 140 mmol/L (ref 134–144)
Total Protein: 6.5 g/dL (ref 6.0–8.5)

## 2020-05-12 LAB — BILE ACIDS, TOTAL: Bile Acids Total: 3.5 umol/L (ref 0.0–10.0)

## 2020-05-16 ENCOUNTER — Other Ambulatory Visit: Payer: Self-pay | Admitting: Obstetrics

## 2020-05-16 ENCOUNTER — Ambulatory Visit: Payer: Medicaid Other | Attending: Obstetrics and Gynecology

## 2020-05-16 ENCOUNTER — Other Ambulatory Visit: Payer: Self-pay

## 2020-05-16 DIAGNOSIS — O3412 Maternal care for benign tumor of corpus uteri, second trimester: Secondary | ICD-10-CM | POA: Diagnosis not present

## 2020-05-16 DIAGNOSIS — Z363 Encounter for antenatal screening for malformations: Secondary | ICD-10-CM

## 2020-05-16 DIAGNOSIS — Z3A21 21 weeks gestation of pregnancy: Secondary | ICD-10-CM

## 2020-05-16 DIAGNOSIS — D259 Leiomyoma of uterus, unspecified: Secondary | ICD-10-CM | POA: Diagnosis not present

## 2020-05-16 DIAGNOSIS — Z3480 Encounter for supervision of other normal pregnancy, unspecified trimester: Secondary | ICD-10-CM

## 2020-05-16 DIAGNOSIS — O99322 Drug use complicating pregnancy, second trimester: Secondary | ICD-10-CM

## 2020-05-16 DIAGNOSIS — F1910 Other psychoactive substance abuse, uncomplicated: Secondary | ICD-10-CM | POA: Insufficient documentation

## 2020-05-17 ENCOUNTER — Ambulatory Visit (INDEPENDENT_AMBULATORY_CARE_PROVIDER_SITE_OTHER): Payer: Medicaid Other | Admitting: Licensed Clinical Social Worker

## 2020-05-17 ENCOUNTER — Other Ambulatory Visit: Payer: Self-pay | Admitting: *Deleted

## 2020-05-17 DIAGNOSIS — F411 Generalized anxiety disorder: Secondary | ICD-10-CM

## 2020-05-17 DIAGNOSIS — Z362 Encounter for other antenatal screening follow-up: Secondary | ICD-10-CM

## 2020-05-17 NOTE — BH Specialist Note (Signed)
Integrated Behavioral Health via Telemedicine Video Visit  05/17/2020 Krista Vaughn [id number]  Number of [place] visits: 1 Session Start time: 10:15am  Session End time: 10:42am Total time: 27 mins via video and telephone due to poor connection.   Referring Provider: Johnney Ou MD  Type of Visit: Video Patient/Family location: Home  [facility] Provider location: [place]  All persons participating in visit: Krista Vaughn and Krista Vaughn. Krista Vaughn   Confirmed patient's address: yes  Confirmed patient's phone number: yes Any changes to demographics: no  Confirmed patient's insurance: no Any changes to patient's insurance: no  Discussed confidentiality: yes   I connected with Krista Vaughn by a video enabled telemedicine application and verified that I am speaking with the correct person using two identifiers.     I discussed the limitations of evaluation and management by telemedicine and the availability of in person appointments.  I discussed that the purpose of this visit is to provide behavioral health care while limiting exposure to the novel coronavirus.   Discussed there is a possibility of technology failure and discussed alternative modes of communication if that failure occurs.  I discussed that engaging in this video visit, they consent to the provision of behavioral healthcare and the services will be billed under their insurance.  Patient and/or legal guardian expressed understanding and consented to video visit: yes   PRESENTING CONCERNS: Patient and/or family reports the following symptoms/concerns: worry, fear, sleep deprivation  Duration of problem: approx 5 months ; Severity of problem: moderate   STRENGTHS (Protective Factors/Coping Skills): Strong family support.  GOALS ADDRESSED: Patient will: 1.  Reduce symptoms of: anxiety    2.  Increase knowledge of diagnosis to learn and demonstrate effective coping  skills to alleviate anxiety 3.  Demonstrate ability to: self manage symptoms   INTERVENTIONS: Interventions utilized:  Supportive counseling  Standardized Assessments completed: phq9 completed 05/17/2020   [place] from 05/17/2020 in [place]  PHQ-9 Total Score  18      ASSESSMENT: Patient currently experiencing general anxiety disorder. Patient reports worry and anxiety increases because she always get medical reports that are not in her favor. Patient reports she lost her job recently due to having several medication appts. Krista Vaughn reports she does not get much sleep due to constant worry about her medical condition.   Patient may benefit from integrated behavioral health   PLAN: 1. Follow up with behavioral health clinician on : 06/07/2020 via mychart  2. Behavioral recommendations: Demonstrated mindufulness techniques with Krista Vaughn to alleviate stress and anxiety, continue close collaboration with medical providers regarding recent surgery. Prioritize sleep and stress reducing activity. 3. Referral(s): none   I discussed the assessment and treatment plan with the patient and/or parent/guardian. They were provided an opportunity to ask questions and all were answered. They agreed with the plan and demonstrated an understanding of the instructions.   They were advised to call back or seek an in-person evaluation if the symptoms worsen or if the condition fails to improve as anticipated.  Lynnea Ferrier

## 2020-05-18 ENCOUNTER — Other Ambulatory Visit: Payer: Self-pay | Admitting: Obstetrics

## 2020-05-18 DIAGNOSIS — Z363 Encounter for antenatal screening for malformations: Secondary | ICD-10-CM

## 2020-05-18 DIAGNOSIS — O99322 Drug use complicating pregnancy, second trimester: Secondary | ICD-10-CM

## 2020-05-18 DIAGNOSIS — Z3A21 21 weeks gestation of pregnancy: Secondary | ICD-10-CM

## 2020-06-06 DIAGNOSIS — D259 Leiomyoma of uterus, unspecified: Secondary | ICD-10-CM | POA: Insufficient documentation

## 2020-06-06 DIAGNOSIS — O3413 Maternal care for benign tumor of corpus uteri, third trimester: Secondary | ICD-10-CM | POA: Insufficient documentation

## 2020-06-07 ENCOUNTER — Encounter: Payer: Medicaid Other | Admitting: Licensed Clinical Social Worker

## 2020-06-07 ENCOUNTER — Telehealth (INDEPENDENT_AMBULATORY_CARE_PROVIDER_SITE_OTHER): Payer: Medicaid Other | Admitting: Student

## 2020-06-07 ENCOUNTER — Encounter: Payer: Self-pay | Admitting: Student

## 2020-06-07 DIAGNOSIS — D251 Intramural leiomyoma of uterus: Secondary | ICD-10-CM

## 2020-06-07 DIAGNOSIS — D250 Submucous leiomyoma of uterus: Secondary | ICD-10-CM

## 2020-06-07 DIAGNOSIS — K8020 Calculus of gallbladder without cholecystitis without obstruction: Secondary | ICD-10-CM

## 2020-06-07 DIAGNOSIS — Z3480 Encounter for supervision of other normal pregnancy, unspecified trimester: Secondary | ICD-10-CM

## 2020-06-07 MED ORDER — HYDROXYZINE PAMOATE 25 MG PO CAPS
25.0000 mg | ORAL_CAPSULE | Freq: Every evening | ORAL | 1 refills | Status: DC | PRN
Start: 2020-06-07 — End: 2020-07-26

## 2020-06-07 NOTE — Progress Notes (Signed)
Virtual ROB   CC: Contractions or Braxton hicks pt unsure pt notes tightness in stomach that comes and goes was not able to sleep last night due to discomfort.   Pt notes not able to sleep at night benadryl not helping any more.   Pt also notes dizzy spells some times. Not able to check B/P at this time.

## 2020-06-07 NOTE — Progress Notes (Signed)
Patient ID: Krista Vaughn, female   DOB: [birth year], 34 y.o.   MRN: [id number]  I connected with@ on 06/07/20 at  9:55 AM EDT by: MyChart video and verified that I am speaking with the correct person using two identifiers.  Patient is located at home  and provider is located at [place].   The purpose of this virtual visit is to provide medical care while limiting exposure to the novel coronavirus. I discussed the limitations, risks, security and privacy concerns of performing an evaluation and management service by MyChart video and the availability of in person appointments. I also discussed with the patient that there may be a patient responsible charge related to this service. By engaging in this virtual visit, you consent to the provision of healthcare.  Additionally, you authorize for your insurance to be billed for the services provided during this visit.  The patient expressed understanding and agreed to proceed.    The following staff members participated in the virtual visit:  Latoya.     PRENATAL VISIT NOTE  Subjective:  Krista Vaughn is a 34 y.o. [phone] at [redacted]w[redacted]d  for phone visit for ongoing prenatal care.  She is currently monitored for the following issues for this high-risk pregnancy and has Marijuana use; Anxiety in pregnancy in second trimester, antepartum; Depression complicating pregnancy in second trimester, antepartum; Supervision of normal pregnancy, antepartum; Upper respiratory infection; Gallstones; Right upper quadrant abdominal pain of unknown etiology; and Uterine fibroid on their problem list.  Patient reports feeling dizzy and having low blood pressure. Also, having some nightmares and feels like she can't sleep. Benadryl is not working anymore. Also braxton hicks occasionally.  Contractions: Irritability. Vag. Bleeding: None.  Movement: Present. Denies leaking of fluid.   The following portions of the patient's history were reviewed and updated as appropriate:  allergies, current medications, past family history, past medical history, past social history, past surgical history and problem list.   Objective:  There were no vitals filed for this visit. Self-Obtained  Fetal Status:     Movement: Present     Assessment and Plan:  Pregnancy: [id number] at [redacted]w[redacted]d 1. Intramural and submucous leiomyoma of uterus Reviewed normal growth on [place] and she has follow up  2. Supervision of other normal pregnancy, antepartum -in person visit next to sign BTL -reviewed 2 hour next visit -will send Rx for vistaril -reviewed eating, drinking, rest to keep her energy up and prevent BH contractions.  -discussed anxiety and management techniques, deep breathing, avoiding anxiety-triggers (news stories, etc).  3. Gallstones   Preterm labor symptoms and general obstetric precautions including but not limited to vaginal bleeding, contractions, leaking of fluid and fetal movement were reviewed in detail with the patient.  Return in about 3 weeks (around 06/28/2020), or in person LROB and 2 hour gtt sign BTL papers.  Future Appointments  Date Time Provider [place]  06/13/2020  9:30 AM [facility] NURSE [facility]  06/13/2020  9:30 AM [facility] US3 [facility]  06/14/2020 10:30 AM Lynnea Ferrier, LCSW [facility] None  06/28/2020  9:15 AM [facility] LAB [facility] None  06/28/2020  9:30 AM Griffin Basil, MD [place] None     Time spent on virtual visit: 30 minutes  [place]arr [place], CNM

## 2020-06-07 NOTE — Patient Instructions (Signed)
Glucose Tolerance Test °Why am I having this test? °The glucose tolerance test (GTT) is done to check how your body processes sugar (glucose). This is one of several tests used to diagnose diabetes (diabetes mellitus). °Your health care provider may recommend this test if you: °· Have a family history of diabetes. °· Are very overweight (obese). °· Have infections that keep coming back (recurring). °· Have had a lot of wounds that did not heal quickly, especially on your legs and feet. °· Are a woman and have a history of giving birth to very large babies or a history of repeated fetal loss (stillbirth). °· Have had high glucose levels in your urine or blood: °? During a past pregnancy. °? After a heart attack, surgery, or prolonged periods of high stress. °What is being tested? °This test measures the amount of glucose in your blood at different times during a period of 2 hours. This indicates how well your body is able to process glucose. °What kind of sample is taken? ° °Blood samples are required for this test. They are usually collected by inserting a needle into a blood vessel. °How do I prepare for this test? °· For 3 days before your test, eat normally. Have plenty of carbohydrate-rich foods. °· Follow instructions from your health care provider about: °? Eating or drinking restrictions on the day of the test. You may be asked to not eat or drink anything other than water (fast) starting 8-12 hours before the test. °? Changing or stopping your regular medicines. Some medicines may interfere with this test. °Tell a health care provider about: °· All medicines you are taking, including vitamins, herbs, eye drops, creams, and over-the-counter medicines. °· Any blood disorders you have. °· Any surgeries you have had. °· Any medical conditions you have. °· Whether you are pregnant or may be pregnant. °What happens during the test? °First, your blood glucose will be measured. This is referred to as your fasting  blood glucose, since you fasted before the test. Then, you will drink a glucose solution that contains a certain amount of glucose. Your blood glucose will be measured again 1 and 2 hours after drinking the solution. °This test takes 2 hours to complete. You will need to stay at the testing location during this time. During the testing period: °· Do not eat or drink anything other than the glucose solution. You will be allowed to drink water. °· Do not exercise. °· Do not use any products that contain nicotine or tobacco, such as cigarettes and e-cigarettes. If you need help stopping, ask your health care provider. °The testing procedure may vary among health care providers and hospitals. °How are the results reported? °Your results will be reported as milligrams of glucose per deciliter of blood (mg/dL) or millimoles per liter (mmol/L). Your health care provider will compare your results to normal ranges that were established after testing a large group of people (reference ranges). Reference ranges may vary among labs and hospitals. For this test, common reference ranges are: °· Fasting: less than 110 mg/dL (6.1 mmol/L). °· 1 hour after drinking glucose: less than 180 mg/dL (10.0 mmol/L). °· 2 hours after drinking glucose: less than 140 mg/dL (7.8 mmol/L). °What do the results mean? °Results that are within the reference ranges are considered normal, meaning that your glucose levels are well controlled. Results higher than the reference ranges may mean that you recently experienced stress, such as from an injury or a sudden (acute) condition like a   heart attack or stroke, or that you have: °· Diabetes. °· Cushing syndrome. °· Tumors such as pheochromocytoma or glucagonoma. °· Kidney failure. °· Pancreatitis. °· Hyperthyroidism. °· An infection. °Talk with your health care provider about what your results mean. °Questions to ask your health care provider °Ask your health care provider, or the department that is  doing the test: °· When will my results be ready? °· How will I get my results? °· What are my treatment options? °· What other tests do I need? °· What are my next steps? °Summary °· The glucose tolerance test (GTT) is done to check how your body processes sugar (glucose). This is one of several tests used to diagnose diabetes (diabetes mellitus). °· This test measures the amount of glucose in your blood at different times during a period of 2 hours. This indicates how well your body is able to process glucose. °· Talk with your health care provider about what your results mean. °This information is not intended to replace advice given to you by your health care provider. Make sure you discuss any questions you have with your health care provider. °Document Revised: 11/29/2017 Document Reviewed: 07/29/2017 °Elsevier Patient Education © 2020 Elsevier Inc. ° °

## 2020-06-13 ENCOUNTER — Other Ambulatory Visit: Payer: Self-pay | Admitting: *Deleted

## 2020-06-13 ENCOUNTER — Ambulatory Visit: Payer: Medicaid Other | Admitting: *Deleted

## 2020-06-13 ENCOUNTER — Other Ambulatory Visit: Payer: Self-pay

## 2020-06-13 ENCOUNTER — Ambulatory Visit: Payer: Medicaid Other | Attending: Obstetrics and Gynecology

## 2020-06-13 VITALS — BP 107/64 | HR 88

## 2020-06-13 DIAGNOSIS — Z3A25 25 weeks gestation of pregnancy: Secondary | ICD-10-CM

## 2020-06-13 DIAGNOSIS — O99322 Drug use complicating pregnancy, second trimester: Secondary | ICD-10-CM

## 2020-06-13 DIAGNOSIS — D259 Leiomyoma of uterus, unspecified: Secondary | ICD-10-CM | POA: Diagnosis present

## 2020-06-13 DIAGNOSIS — Z362 Encounter for other antenatal screening follow-up: Secondary | ICD-10-CM

## 2020-06-13 DIAGNOSIS — O3410 Maternal care for benign tumor of corpus uteri, unspecified trimester: Secondary | ICD-10-CM | POA: Diagnosis present

## 2020-06-13 DIAGNOSIS — O3412 Maternal care for benign tumor of corpus uteri, second trimester: Secondary | ICD-10-CM

## 2020-06-13 DIAGNOSIS — K8020 Calculus of gallbladder without cholecystitis without obstruction: Secondary | ICD-10-CM | POA: Insufficient documentation

## 2020-06-13 DIAGNOSIS — F1910 Other psychoactive substance abuse, uncomplicated: Secondary | ICD-10-CM | POA: Diagnosis not present

## 2020-06-13 DIAGNOSIS — O322XX0 Maternal care for transverse and oblique lie, not applicable or unspecified: Secondary | ICD-10-CM

## 2020-06-14 ENCOUNTER — Encounter: Payer: Medicaid Other | Admitting: Licensed Clinical Social Worker

## 2020-06-17 ENCOUNTER — Telehealth: Payer: Self-pay

## 2020-06-17 NOTE — Telephone Encounter (Signed)
Return call to pt regarding low B/P and feeling faint Pt states she eats 2-3 meals a day Had 1 slice of toast and 2 small [place] sausages for breakfast I advised pt she needs to increase food intake and eat more frequently than 2 meals a day and food that will boost her energy ,add fruits and veggies to provide nutrients and enough energy for her and her baby. I asked her to eat and see how she feels and if no changes in how she is feeling to go to [facility] asap Pt agreeable and voiced understanding.

## 2020-06-28 ENCOUNTER — Other Ambulatory Visit: Payer: Medicaid Other

## 2020-06-28 ENCOUNTER — Ambulatory Visit (INDEPENDENT_AMBULATORY_CARE_PROVIDER_SITE_OTHER): Payer: Medicaid Other | Admitting: Obstetrics and Gynecology

## 2020-06-28 ENCOUNTER — Other Ambulatory Visit: Payer: Self-pay

## 2020-06-28 VITALS — BP 111/72 | HR 93 | Wt 149.0 lb

## 2020-06-28 DIAGNOSIS — Z3A28 28 weeks gestation of pregnancy: Secondary | ICD-10-CM

## 2020-06-28 DIAGNOSIS — Z3490 Encounter for supervision of normal pregnancy, unspecified, unspecified trimester: Secondary | ICD-10-CM

## 2020-06-28 DIAGNOSIS — D259 Leiomyoma of uterus, unspecified: Secondary | ICD-10-CM

## 2020-06-28 DIAGNOSIS — O3413 Maternal care for benign tumor of corpus uteri, third trimester: Secondary | ICD-10-CM

## 2020-06-28 NOTE — Patient Instructions (Signed)

## 2020-06-28 NOTE — Progress Notes (Signed)
   PRENATAL VISIT NOTE  Subjective:  Krista Vaughn is a 34 y.o. [phone] at [redacted]w[redacted]d being seen today for ongoing prenatal care.  She is currently monitored for the following issues for this low-risk pregnancy and has Marijuana use; Anxiety in pregnancy in second trimester, antepartum; Depression complicating pregnancy in second trimester, antepartum; Supervision of normal pregnancy, antepartum; Upper respiratory infection; Gallstones; Right upper quadrant abdominal pain of unknown etiology; and Uterine fibroid on their problem list.  Patient doing well with no acute concerns today. She reports backache and leg cramps.  Contractions: Irritability. Vag. Bleeding: None.  Movement: Present. Denies leaking of fluid.   The following portions of the patient's history were reviewed and updated as appropriate: allergies, current medications, past family history, past medical history, past social history, past surgical history and problem list. Problem list updated.  Objective:   Vitals:   06/28/20 0943  BP: 111/72  Pulse: 93  Weight: 149 lb (67.6 kg)    Fetal Status: Fetal Heart Rate (bpm): 148   Movement: Present     General:  Alert, oriented and cooperative. Patient is in no acute distress.  Skin: Skin is warm and dry. No rash noted.   Cardiovascular: Normal heart rate noted  Respiratory: Normal respiratory effort, no problems with respiration noted  Abdomen: Soft, gravid, appropriate for gestational age.  Pain/Pressure: Present     Pelvic: Cervical exam deferred        Extremities: Normal range of motion.  Edema: Trace  Mental Status:  Normal mood and affect. Normal behavior. Normal judgment and thought content.   Assessment and Plan:  Pregnancy: [id number] at [redacted]w[redacted]d  1. Encounter for supervision of normal pregnancy, antepartum, unspecified gravidity  - Glucose Tolerance, 2 Hours w/1 Hour - CBC - HIV antibody (with reflex) - RPR  2. Uterine leiomyoma, unspecified location Pt  stable  Preterm labor symptoms and general obstetric precautions including but not limited to vaginal bleeding, contractions, leaking of fluid and fetal movement were reviewed in detail with the patient. Pt advised to increase water intake and to add bananas and gatorade to diet to address leg cramps  Please refer to After Visit Summary for other counseling recommendations.   Return in about 2 weeks (around 07/12/2020) for ROB.   Lynnda Shields, MD

## 2020-06-28 NOTE — Progress Notes (Addendum)
ROB/GTT, c/o dizziness, muscle spasm in legs, tightness in back and pressure, concerned about fetal growth. Declined TDAP. BTL signed.

## 2020-06-29 LAB — CBC
Hematocrit: 29.1 % — ABNORMAL LOW (ref 34.0–46.6)
Hemoglobin: 9.7 g/dL — ABNORMAL LOW (ref 11.1–15.9)
MCH: 28.7 pg (ref 26.6–33.0)
MCHC: 33.3 g/dL (ref 31.5–35.7)
MCV: 86 fL (ref 79–97)
Platelets: 299 10*3/uL (ref 150–450)
RBC: 3.38 x10E6/uL — ABNORMAL LOW (ref 3.77–5.28)
RDW: 14.3 % (ref 11.7–15.4)
WBC: 11 10*3/uL — ABNORMAL HIGH (ref 3.4–10.8)

## 2020-06-29 LAB — GLUCOSE TOLERANCE, 2 HOURS W/ 1HR
Glucose, 1 hour: 127 mg/dL (ref 65–179)
Glucose, 2 hour: 99 mg/dL (ref 65–152)
Glucose, Fasting: 81 mg/dL (ref 65–91)

## 2020-06-29 LAB — RPR: RPR Ser Ql: NONREACTIVE

## 2020-06-29 LAB — HIV ANTIBODY (ROUTINE TESTING W REFLEX): HIV Screen 4th Generation wRfx: NONREACTIVE

## 2020-07-01 ENCOUNTER — Other Ambulatory Visit: Payer: Self-pay | Admitting: Obstetrics

## 2020-07-01 DIAGNOSIS — O99019 Anemia complicating pregnancy, unspecified trimester: Secondary | ICD-10-CM

## 2020-07-01 MED ORDER — FERROUS SULFATE 325 (65 FE) MG PO TABS: 325.0000 mg | ORAL_TABLET | Freq: Two times a day (BID) | ORAL | 5 refills | Status: DC

## 2020-07-01 NOTE — Progress Notes (Signed)
Iron Rx for anemia.

## 2020-07-12 ENCOUNTER — Telehealth (INDEPENDENT_AMBULATORY_CARE_PROVIDER_SITE_OTHER): Payer: Medicaid Other | Admitting: Obstetrics and Gynecology

## 2020-07-12 DIAGNOSIS — K8020 Calculus of gallbladder without cholecystitis without obstruction: Secondary | ICD-10-CM

## 2020-07-12 DIAGNOSIS — F419 Anxiety disorder, unspecified: Secondary | ICD-10-CM

## 2020-07-12 DIAGNOSIS — O3413 Maternal care for benign tumor of corpus uteri, third trimester: Secondary | ICD-10-CM

## 2020-07-12 DIAGNOSIS — O99613 Diseases of the digestive system complicating pregnancy, third trimester: Secondary | ICD-10-CM

## 2020-07-12 DIAGNOSIS — Z3A30 30 weeks gestation of pregnancy: Secondary | ICD-10-CM

## 2020-07-12 DIAGNOSIS — O99343 Other mental disorders complicating pregnancy, third trimester: Secondary | ICD-10-CM

## 2020-07-12 DIAGNOSIS — D259 Leiomyoma of uterus, unspecified: Secondary | ICD-10-CM

## 2020-07-12 DIAGNOSIS — Z3490 Encounter for supervision of normal pregnancy, unspecified, unspecified trimester: Secondary | ICD-10-CM

## 2020-07-12 NOTE — Patient Instructions (Signed)
Uterine Fibroids  Uterine fibroids are lumps of tissue (tumors) in your womb (uterus). They are not cancer (are benign). Most women with this condition do not need treatment. Sometimes fibroids can affect your ability to have children (your fertility). If that happens, you may need surgery to take out the fibroids. Follow these instructions at home:  Take over-the-counter and prescription medicines only as told by your doctor. Your doctor may suggest NSAIDs (such as aspirin or ibuprofen) to help with pain.  Ask your doctor if you should: ? Take iron pills. ? Eat more foods that have iron in them, such as dark green, leafy vegetables.  If directed, apply heat to your back or belly to reduce pain. Use the heat source that your doctor recommends, such as a moist heat pack or a heating pad. ? Put a towel between your skin and the heat source. ? Leave the heat on for 20-30 minutes. ? Remove the heat if your skin turns bright red. This is especially important if you are unable to feel pain, heat, or cold. You may have a greater risk of getting burned.  Pay close attention to your period (menstrual) cycles. Tell your doctor about any changes, such as: ? A heavier blood flow than usual. ? Needing to use more pads or tampons than normal. ? A change in how many days your period lasts. ? A change in symptoms that come with your period, such as cramps or back pain.  Keep all follow-up visits as told by your doctor. This is important. Your doctor may need to watch your fibroids over time for any changes. Contact a doctor if you:  Have pain that does not get better with medicine or heat, such as pain or cramps in: ? Your back. ? The area between your hip bones (pelvic area). ? Your belly.  Have new bleeding between your periods.  Have more bleeding during or between your periods.  Feel very tired or weak.  Feel light-headed. Get help right away if you:  Pass out (faint).  Have pain in the  area between your hip bones that suddenly gets worse.  Have bleeding that soaks a tampon or pad in 30 minutes or less. Summary  Uterine fibroids are lumps of tissue (tumors) in your womb (uterus). They are not cancer.  The only treatment that most women need is taking aspirin or ibuprofen for pain.  Contact a doctor if you have pain or cramps that do not get better with medicine.  Make sure you know what symptoms you should get help for right away. This information is not intended to replace advice given to you by your health care provider. Make sure you discuss any questions you have with your health care provider. Document Revised: 11/29/2017 Document Reviewed: 11/12/2017 Elsevier Patient Education  [place] of Pregnancy  The third trimester is from week 28 through week 40 (months 7 through 9). This trimester is when your unborn baby (fetus) is growing very fast. At the end of the ninth month, the unborn baby is about 20 inches in length. It weighs about 6-10 pounds. Follow these instructions at home: Medicines  Take over-the-counter and prescription medicines only as told by your doctor. Some medicines are safe and some medicines are not safe during pregnancy.  Take a prenatal vitamin that contains at least 600 micrograms (mcg) of folic acid.  If you have trouble pooping (constipation), take medicine that will make your stool soft (stool softener) if  your doctor approves. Eating and drinking   Eat regular, healthy meals.  Avoid raw meat and uncooked cheese.  If you get low calcium from the food you eat, talk to your doctor about taking a daily calcium supplement.  Eat four or five small meals rather than three large meals a day.  Avoid foods that are high in fat and sugars, such as fried and sweet foods.  To prevent constipation: ? Eat foods that are high in fiber, like fresh fruits and vegetables, whole grains, and beans. ? Drink enough fluids  to keep your pee (urine) clear or pale yellow. Activity  Exercise only as told by your doctor. Stop exercising if you start to have cramps.  Avoid heavy lifting, wear low heels, and sit up straight.  Do not exercise if it is too hot, too humid, or if you are in a place of great height (high altitude).  You may continue to have sex unless your doctor tells you not to. Relieving pain and discomfort  Wear a good support bra if your breasts are tender.  Take frequent breaks and rest with your legs raised if you have leg cramps or low back pain.  Take warm water baths (sitz baths) to soothe pain or discomfort caused by hemorrhoids. Use hemorrhoid cream if your doctor approves.  If you develop puffy, bulging veins (varicose veins) in your legs: ? Wear support hose or compression stockings as told by your doctor. ? Raise (elevate) your feet for 15 minutes, 3-4 times a day. ? Limit salt in your food. Safety  Wear your seat belt when driving.  Make a list of emergency phone numbers, including numbers for family, friends, the hospital, and police and fire departments. Preparing for your baby's arrival To prepare for the arrival of your baby:  Take prenatal classes.  Practice driving to the hospital.  Visit the hospital and tour the maternity area.  Talk to your work about taking leave once the baby comes.  Pack your hospital bag.  Prepare the baby's room.  Go to your doctor visits.  Buy a rear-facing car seat. Learn how to install it in your car. General instructions  Do not use hot tubs, steam rooms, or saunas.  Do not use any products that contain nicotine or tobacco, such as cigarettes and e-cigarettes. If you need help quitting, ask your doctor.  Do not drink alcohol.  Do not douche or use tampons or scented sanitary pads.  Do not cross your legs for long periods of time.  Do not travel for long distances unless you must. Only do so if your doctor says it is  okay.  Visit your dentist if you have not gone during your pregnancy. Use a soft toothbrush to brush your teeth. Be gentle when you floss.  Avoid cat litter boxes and soil used by cats. These carry germs that can cause birth defects in the baby and can cause a loss of your baby (miscarriage) or stillbirth.  Keep all your prenatal visits as told by your doctor. This is important. Contact a doctor if:  You are not sure if you are in labor or if your water has broken.  You are dizzy.  You have mild cramps or pressure in your lower belly.  You have a nagging pain in your belly area.  You continue to feel sick to your stomach, you throw up, or you have watery poop.  You have bad smelling fluid coming from your vagina.  You have  pain when you pee. Get help right away if:  You have a fever.  You are leaking fluid from your vagina.  You are spotting or bleeding from your vagina.  You have severe belly cramps or pain.  You lose or gain weight quickly.  You have trouble catching your breath and have chest pain.  You notice sudden or extreme puffiness (swelling) of your face, hands, ankles, feet, or legs.  You have not felt the baby move in over an hour.  You have severe headaches that do not go away with medicine.  You have trouble seeing.  You are leaking, or you are having a gush of fluid, from your vagina before you are 37 weeks.  You have regular belly spasms (contractions) before you are 37 weeks. Summary  The third trimester is from week 28 through week 40 (months 7 through 9). This time is when your unborn baby is growing very fast.  Follow your doctor's advice about medicine, food, and activity.  Get ready for the arrival of your baby by taking prenatal classes, getting all the baby items ready, preparing the baby's room, and visiting your doctor to be checked.  Get help right away if you are bleeding from your vagina, or you have chest pain and trouble catching  your breath, or if you have not felt your baby move in over an hour. This information is not intended to replace advice given to you by your health care provider. Make sure you discuss any questions you have with your health care provider. Document Revised: 04/09/2019 Document Reviewed: 01/22/2017 Elsevier Patient Education  [place].

## 2020-07-12 NOTE — Progress Notes (Signed)
I connected with@ on 07/12/20 at 11:00 AM EDT by: MyChart video and verified that I am speaking with the correct person using two identifiers.  Patient is located at home and provider is located at [place].     The purpose of this virtual visit is to provide medical care while limiting exposure to the novel coronavirus. I discussed the limitations, risks, security and privacy concerns of performing an evaluation and management service by MyChart video and the availability of in person appointments. I also discussed with the patient that there may be a patient responsible charge related to this service. By engaging in this virtual visit, you consent to the provision of healthcare.  Additionally, you authorize for your insurance to be billed for the services provided during this visit.  The patient expressed understanding and agreed to proceed.  The following staff members participated in the virtual visit:  Kern Reap. RMA    PRENATAL VISIT NOTE  Subjective:  Krista Vaughn is a 34 y.o. [id number] at [redacted]w[redacted]d  for phone visit for ongoing prenatal care.  She is currently monitored for the following issues for this low-risk pregnancy and has Marijuana use; Anxiety during pregnancy in third trimester, antepartum; Depression complicating pregnancy in third trimester, antepartum; Supervision of normal pregnancy, antepartum; Upper respiratory infection; Gallstones; Right upper quadrant abdominal pain of unknown etiology; and Uterine fibroids affecting pregnancy in third trimester on their problem list.   Discussed abdominal pain around belly button.  Pt is unsure if there is a mass there or a buldge.  Pt advised if it becomes uncomfortable she may need earlier follow up or MAU visit.  Patient reports backache and pain with walking.  Contractions: Irritability. Vag. Bleeding: None.  Movement: Present. Denies leaking of fluid.   The following portions of the patient's history were reviewed and updated as  appropriate: allergies, current medications, past family history, past medical history, past social history, past surgical history and problem list.   Objective:  There were no vitals filed for this visit. Self-Obtained  Fetal Status:     Movement: Present     Assessment and Plan:  Pregnancy: [id number] at [redacted]w[redacted]d 1. Gallstones   2. Encounter for supervision of normal pregnancy, antepartum, unspecified gravidity Pt reassured regarding her back pain abd abdominal pain.  Pt advised to go to MAU if pain cannot be controlled before next visit.  3. Anxiety during pregnancy in third trimester, antepartum  4. Fibroid uterus in pregnancy:  Discussed fibroid findings from last u/s and reassured pt that the mass is stable ikn size   Preterm labor symptoms and general obstetric precautions including but not limited to vaginal bleeding, contractions, leaking of fluid and fetal movement were reviewed in detail with the patient.  Return in about 2 weeks (around 07/26/2020) for ROB, in person.  Future Appointments  Date Time Provider [place]  07/18/2020  9:45 AM [facility] NURSE [facility]  07/18/2020  9:45 AM [facility] US5 [facility] [place]     Time spent on virtual visit: 17 minutes  Griffin Basil, MD

## 2020-07-12 NOTE — Progress Notes (Signed)
I connected with  Krista Vaughn on 07/12/20 by a video enabled telemedicine application and verified that I am speaking with the correct person using two identifiers.   I discussed the limitations of evaluation and management by telemedicine. The patient expressed understanding and agreed to proceed.  MyChart OB c/o pelvic pain 8/10 x 1 week.  Patient is unable to check her BP because she is not at home.

## 2020-07-18 ENCOUNTER — Ambulatory Visit: Payer: Medicaid Other | Attending: Obstetrics and Gynecology

## 2020-07-18 ENCOUNTER — Other Ambulatory Visit: Payer: Self-pay | Admitting: *Deleted

## 2020-07-18 ENCOUNTER — Ambulatory Visit: Payer: Medicaid Other | Admitting: *Deleted

## 2020-07-18 ENCOUNTER — Other Ambulatory Visit: Payer: Self-pay

## 2020-07-18 VITALS — BP 105/62 | HR 92

## 2020-07-18 DIAGNOSIS — O3410 Maternal care for benign tumor of corpus uteri, unspecified trimester: Secondary | ICD-10-CM | POA: Insufficient documentation

## 2020-07-18 DIAGNOSIS — O3413 Maternal care for benign tumor of corpus uteri, third trimester: Secondary | ICD-10-CM | POA: Diagnosis not present

## 2020-07-18 DIAGNOSIS — D259 Leiomyoma of uterus, unspecified: Secondary | ICD-10-CM | POA: Diagnosis not present

## 2020-07-18 DIAGNOSIS — Z3A30 30 weeks gestation of pregnancy: Secondary | ICD-10-CM

## 2020-07-18 DIAGNOSIS — K8020 Calculus of gallbladder without cholecystitis without obstruction: Secondary | ICD-10-CM | POA: Diagnosis present

## 2020-07-18 DIAGNOSIS — Z362 Encounter for other antenatal screening follow-up: Secondary | ICD-10-CM

## 2020-07-18 DIAGNOSIS — O99323 Drug use complicating pregnancy, third trimester: Secondary | ICD-10-CM

## 2020-07-18 DIAGNOSIS — F1910 Other psychoactive substance abuse, uncomplicated: Secondary | ICD-10-CM

## 2020-07-26 ENCOUNTER — Ambulatory Visit (INDEPENDENT_AMBULATORY_CARE_PROVIDER_SITE_OTHER): Payer: Medicaid Other | Admitting: Obstetrics

## 2020-07-26 ENCOUNTER — Encounter: Payer: Self-pay | Admitting: Obstetrics

## 2020-07-26 ENCOUNTER — Other Ambulatory Visit: Payer: Self-pay

## 2020-07-26 VITALS — BP 108/65 | HR 85 | Wt 148.6 lb

## 2020-07-26 DIAGNOSIS — K219 Gastro-esophageal reflux disease without esophagitis: Secondary | ICD-10-CM

## 2020-07-26 DIAGNOSIS — F419 Anxiety disorder, unspecified: Secondary | ICD-10-CM

## 2020-07-26 DIAGNOSIS — Z3A32 32 weeks gestation of pregnancy: Secondary | ICD-10-CM

## 2020-07-26 DIAGNOSIS — Z3483 Encounter for supervision of other normal pregnancy, third trimester: Secondary | ICD-10-CM

## 2020-07-26 DIAGNOSIS — Z3490 Encounter for supervision of normal pregnancy, unspecified, unspecified trimester: Secondary | ICD-10-CM

## 2020-07-26 MED ORDER — HYDROXYZINE PAMOATE 25 MG PO CAPS: 25.0000 mg | ORAL_CAPSULE | Freq: Every evening | ORAL | 1 refills | Status: DC | PRN

## 2020-07-26 MED ORDER — OMEPRAZOLE 20 MG PO CPDR: 20.0000 mg | DELAYED_RELEASE_CAPSULE | Freq: Two times a day (BID) | ORAL | 5 refills | Status: DC

## 2020-07-26 NOTE — Progress Notes (Signed)
Subjective:  Krista Vaughn is a 34 y.o. [phone] at [redacted]w[redacted]d being seen today for ongoing prenatal care.  She is currently monitored for the following issues for this low-risk pregnancy and has Marijuana use; Anxiety during pregnancy in third trimester, antepartum; Depression complicating pregnancy in third trimester, antepartum; Supervision of normal pregnancy, antepartum; Upper respiratory infection; Gallstones; Right upper quadrant abdominal pain of unknown etiology; and Uterine fibroids affecting pregnancy in third trimester on their problem list.  Patient reports backache and heartburn and anxiety.  Contractions: Not present. Vag. Bleeding: None.  Movement: Present. Denies leaking of fluid.   The following portions of the patient's history were reviewed and updated as appropriate: allergies, current medications, past family history, past medical history, past social history, past surgical history and problem list. Problem list updated.  Objective:   Vitals:   07/26/20 1001  BP: 108/65  Pulse: 85  Weight: 148 lb 9.6 oz (67.4 kg)    Fetal Status:     Movement: Present     General:  Alert, oriented and cooperative. Patient is in no acute distress.  Skin: Skin is warm and dry. No rash noted.   Cardiovascular: Normal heart rate noted  Respiratory: Normal respiratory effort, no problems with respiration noted  Abdomen: Soft, gravid, appropriate for gestational age. Pain/Pressure: Absent     Pelvic:  Cervical exam deferred        Extremities: Normal range of motion.  Edema: Trace  Mental Status: Normal mood and affect. Normal behavior. Normal judgment and thought content.   Urinalysis:      Assessment and Plan:  Pregnancy: [id number] at [redacted]w[redacted]d  1. Encounter for supervision of normal pregnancy, antepartum, unspecified gravidity  2. GERD without esophagitis Rx: - omeprazole (PRILOSEC) 20 MG capsule; Take 1 capsule (20 mg total) by mouth 2 (two) times daily before a meal.  Dispense: 60  capsule; Refill: 5  3. Anxiety Rx: - hydrOXYzine (VISTARIL) 25 MG capsule; Take 1 capsule (25 mg total) by mouth at bedtime as needed for anxiety.  Dispense: 30 capsule; Refill: 1  Preterm labor symptoms and general obstetric precautions including but not limited to vaginal bleeding, contractions, leaking of fluid and fetal movement were reviewed in detail with the patient. Please refer to After Visit Summary for other counseling recommendations.   Return in about 2 weeks (around 08/09/2020) for MyChart.   Shelly Bombard, MD  07/26/20

## 2020-07-26 NOTE — Progress Notes (Signed)
Pt is here for Rob, [redacted]w[redacted]d.

## 2020-08-09 ENCOUNTER — Telehealth (INDEPENDENT_AMBULATORY_CARE_PROVIDER_SITE_OTHER): Payer: Medicaid Other | Admitting: Obstetrics

## 2020-08-09 ENCOUNTER — Encounter: Payer: Self-pay | Admitting: Obstetrics

## 2020-08-09 DIAGNOSIS — Z3A34 34 weeks gestation of pregnancy: Secondary | ICD-10-CM

## 2020-08-09 DIAGNOSIS — O99343 Other mental disorders complicating pregnancy, third trimester: Secondary | ICD-10-CM | POA: Diagnosis not present

## 2020-08-09 DIAGNOSIS — O99013 Anemia complicating pregnancy, third trimester: Secondary | ICD-10-CM

## 2020-08-09 DIAGNOSIS — D649 Anemia, unspecified: Secondary | ICD-10-CM

## 2020-08-09 DIAGNOSIS — Z3490 Encounter for supervision of normal pregnancy, unspecified, unspecified trimester: Secondary | ICD-10-CM

## 2020-08-09 DIAGNOSIS — F419 Anxiety disorder, unspecified: Secondary | ICD-10-CM

## 2020-08-09 DIAGNOSIS — O99019 Anemia complicating pregnancy, unspecified trimester: Secondary | ICD-10-CM

## 2020-08-09 NOTE — Progress Notes (Signed)
Virtual Visit via Telephone Note  I connected with Krista Vaughn on 08/09/20 at 11:00 AM EDT by telephone and verified that I am speaking with the correct person using two identifiers.  Pt c/o sharp abdominal pain x couple of weeks.  Pt at work unable to check bp right now

## 2020-08-09 NOTE — Progress Notes (Signed)
   TELEHEALTH OBSTETRICS VISIT ENCOUNTER NOTE  I connected with Krista Vaughn on 08/09/20 at 11:00 AM EDT by telephone at home and verified that I am speaking with the correct person using two identifiers.   I discussed the limitations, risks, security and privacy concerns of performing an evaluation and management service by telephone and the availability of in person appointments. I also discussed with the patient that there may be a patient responsible charge related to this service. The patient expressed understanding and agreed to proceed.  Subjective:  Krista Vaughn is a 34 y.o. [phone] at [redacted]w[redacted]d being followed for ongoing prenatal care.  She is currently monitored for the following issues for this low-risk pregnancy and has Marijuana use; Anxiety during pregnancy in third trimester, antepartum; Depression complicating pregnancy in third trimester, antepartum; Supervision of normal pregnancy, antepartum; Upper respiratory infection; Gallstones; Right upper quadrant abdominal pain of unknown etiology; and Uterine fibroids affecting pregnancy in third trimester on their problem list.  Patient reports occasional SOB.  Denies chest pain. Reports fetal movement. Denies any contractions, bleeding or leaking of fluid.   The following portions of the patient's history were reviewed and updated as appropriate: allergies, current medications, past family history, past medical history, past social history, past surgical history and problem list.   Objective:   General:  Alert, oriented and cooperative.   Mental Status: Normal mood and affect perceived. Normal judgment and thought content.  Rest of physical exam deferred due to type of encounter  Assessment and Plan:  Pregnancy: [id number] at [redacted]w[redacted]d  1. Encounter for supervision of normal pregnancy, antepartum, unspecified gravidity  2. Anxiety during pregnancy in third trimester, antepartum  3. Anemia affecting pregnancy,  antepartum    Preterm labor symptoms and general obstetric precautions including but not limited to vaginal bleeding, contractions, leaking of fluid and fetal movement were reviewed in detail with the patient.  I discussed the assessment and treatment plan with the patient. The patient was provided an opportunity to ask questions and all were answered. The patient agreed with the plan and demonstrated an understanding of the instructions. The patient was advised to call back or seek an in-person office evaluation/go to MAU at [facility] for any urgent or concerning symptoms. Please refer to After Visit Summary for other counseling recommendations.   I provided 10 minutes of non-face-to-face time during this encounter.  Return in about 2 weeks (around 08/23/2020) for ROB.  Future Appointments  Date Time Provider [place]  08/15/2020 10:30 AM [facility] NURSE [facility]  08/15/2020 10:45 AM [facility] US4 [facility] [place], [place] for [place] 08/09/20

## 2020-08-15 ENCOUNTER — Other Ambulatory Visit: Payer: Self-pay

## 2020-08-15 ENCOUNTER — Ambulatory Visit: Payer: Medicaid Other | Attending: Obstetrics and Gynecology

## 2020-08-15 ENCOUNTER — Ambulatory Visit: Payer: Medicaid Other | Admitting: *Deleted

## 2020-08-15 ENCOUNTER — Encounter: Payer: Self-pay | Admitting: *Deleted

## 2020-08-15 DIAGNOSIS — O3413 Maternal care for benign tumor of corpus uteri, third trimester: Secondary | ICD-10-CM

## 2020-08-15 DIAGNOSIS — O99323 Drug use complicating pregnancy, third trimester: Secondary | ICD-10-CM

## 2020-08-15 DIAGNOSIS — F1910 Other psychoactive substance abuse, uncomplicated: Secondary | ICD-10-CM

## 2020-08-15 DIAGNOSIS — D259 Leiomyoma of uterus, unspecified: Secondary | ICD-10-CM | POA: Insufficient documentation

## 2020-08-15 DIAGNOSIS — O3410 Maternal care for benign tumor of corpus uteri, unspecified trimester: Secondary | ICD-10-CM | POA: Insufficient documentation

## 2020-08-15 DIAGNOSIS — K8020 Calculus of gallbladder without cholecystitis without obstruction: Secondary | ICD-10-CM | POA: Insufficient documentation

## 2020-08-15 DIAGNOSIS — Z3A34 34 weeks gestation of pregnancy: Secondary | ICD-10-CM

## 2020-08-15 DIAGNOSIS — Z362 Encounter for other antenatal screening follow-up: Secondary | ICD-10-CM | POA: Diagnosis not present

## 2020-08-23 ENCOUNTER — Encounter: Payer: Medicaid Other | Admitting: Obstetrics

## 2020-08-24 ENCOUNTER — Other Ambulatory Visit (HOSPITAL_COMMUNITY)
Admission: RE | Admit: 2020-08-24 | Discharge: 2020-08-24 | Disposition: A | Discharge status: Home / Self Care | Payer: Medicaid Other | Source: Ambulatory Visit | Attending: Obstetrics | Admitting: Obstetrics

## 2020-08-24 ENCOUNTER — Ambulatory Visit (INDEPENDENT_AMBULATORY_CARE_PROVIDER_SITE_OTHER): Payer: Medicaid Other | Admitting: Obstetrics

## 2020-08-24 ENCOUNTER — Other Ambulatory Visit: Payer: Self-pay

## 2020-08-24 ENCOUNTER — Encounter: Payer: Self-pay | Admitting: Obstetrics

## 2020-08-24 VITALS — BP 104/66 | HR 90 | Wt 155.3 lb

## 2020-08-24 DIAGNOSIS — Z3490 Encounter for supervision of normal pregnancy, unspecified, unspecified trimester: Secondary | ICD-10-CM | POA: Insufficient documentation

## 2020-08-24 DIAGNOSIS — O99013 Anemia complicating pregnancy, third trimester: Secondary | ICD-10-CM

## 2020-08-24 DIAGNOSIS — O99343 Other mental disorders complicating pregnancy, third trimester: Secondary | ICD-10-CM

## 2020-08-24 DIAGNOSIS — D259 Leiomyoma of uterus, unspecified: Secondary | ICD-10-CM

## 2020-08-24 DIAGNOSIS — F419 Anxiety disorder, unspecified: Secondary | ICD-10-CM

## 2020-08-24 DIAGNOSIS — O99019 Anemia complicating pregnancy, unspecified trimester: Secondary | ICD-10-CM

## 2020-08-24 DIAGNOSIS — Z3A36 36 weeks gestation of pregnancy: Secondary | ICD-10-CM

## 2020-08-24 NOTE — Progress Notes (Signed)
Patient presents for ROB and GBS. Patient has concerns about always feeling faint daily. Educated patient on slow position changes, staying hydrated, and monitoring bp.

## 2020-08-24 NOTE — Progress Notes (Signed)
Subjective:  Krista Vaughn is a 34 y.o. [phone] at [redacted]w[redacted]d being seen today for ongoing prenatal care.  She is currently monitored for the following issues for this low-risk pregnancy and has Marijuana use; Anxiety during pregnancy in third trimester, antepartum; Depression complicating pregnancy in third trimester, antepartum; Supervision of normal pregnancy, antepartum; Upper respiratory infection; Gallstones; Right upper quadrant abdominal pain of unknown etiology; and Uterine fibroids affecting pregnancy in third trimester on their problem list.  Patient reports sporadic dizziness when standing.  Contractions: Irregular. Vag. Bleeding: None.  Movement: Present. Denies leaking of fluid.   The following portions of the patient's history were reviewed and updated as appropriate: allergies, current medications, past family history, past medical history, past social history, past surgical history and problem list. Problem list updated.  Objective:   Vitals:   08/24/20 1059  BP: 104/66  Pulse: 90  Weight: 155 lb 4.8 oz (70.4 kg)    Fetal Status:     Movement: Present     General:  Alert, oriented and cooperative. Patient is in no acute distress.  Skin: Skin is warm and dry. No rash noted.   Cardiovascular: Normal heart rate noted  Respiratory: Normal respiratory effort, no problems with respiration noted  Abdomen: Soft, gravid, appropriate for gestational age. Pain/Pressure: Present     Pelvic:  Cervical exam deferred        Extremities: Normal range of motion.  Edema: Trace  Mental Status: Normal mood and affect. Normal behavior. Normal judgment and thought content.   Urinalysis:      Assessment and Plan:  Pregnancy: [id number] at [redacted]w[redacted]d  1. Encounter for supervision of normal pregnancy, antepartum, unspecified gravidity  2. Anxiety during pregnancy in third trimester, antepartum  3. Anemia affecting pregnancy, antepartum  4. Uterine leiomyoma, unspecified location   Term labor  symptoms and general obstetric precautions including but not limited to vaginal bleeding, contractions, leaking of fluid and fetal movement were reviewed in detail with the patient. Please refer to After Visit Summary for other counseling recommendations.   Return in about 1 week (around 08/31/2020) for MyChart.   Shelly Bombard, MD  08/24/20

## 2020-08-25 LAB — CERVICOVAGINAL ANCILLARY ONLY
Bacterial Vaginitis (gardnerella): NEGATIVE
Candida Glabrata: NEGATIVE
Candida Vaginitis: NEGATIVE
Chlamydia: NEGATIVE
Comment: NEGATIVE
Comment: NEGATIVE
Comment: NEGATIVE
Comment: NEGATIVE
Comment: NEGATIVE
Comment: NORMAL
Neisseria Gonorrhea: NEGATIVE
Trichomonas: NEGATIVE

## 2020-08-28 LAB — CULTURE, BETA STREP (GROUP B ONLY): Strep Gp B Culture: NEGATIVE

## 2020-08-31 ENCOUNTER — Encounter: Payer: Self-pay | Admitting: Obstetrics

## 2020-08-31 ENCOUNTER — Telehealth (INDEPENDENT_AMBULATORY_CARE_PROVIDER_SITE_OTHER): Payer: Medicaid Other | Admitting: Obstetrics

## 2020-08-31 DIAGNOSIS — Z302 Encounter for sterilization: Secondary | ICD-10-CM

## 2020-08-31 DIAGNOSIS — Z3A37 37 weeks gestation of pregnancy: Secondary | ICD-10-CM | POA: Diagnosis not present

## 2020-08-31 DIAGNOSIS — O99343 Other mental disorders complicating pregnancy, third trimester: Secondary | ICD-10-CM | POA: Diagnosis not present

## 2020-08-31 DIAGNOSIS — F419 Anxiety disorder, unspecified: Secondary | ICD-10-CM | POA: Diagnosis not present

## 2020-08-31 DIAGNOSIS — Z3490 Encounter for supervision of normal pregnancy, unspecified, unspecified trimester: Secondary | ICD-10-CM

## 2020-08-31 NOTE — Progress Notes (Signed)
° °  TELEHEALTH OBSTETRICS VISIT ENCOUNTER NOTE  I connected with Krista Vaughn on 08/31/20 at 10:15 AM EDT by telephone at home and verified that I am speaking with the correct person using two identifiers.   I discussed the limitations, risks, security and privacy concerns of performing an evaluation and management service by telephone and the availability of in person appointments. I also discussed with the patient that there may be a patient responsible charge related to this service. The patient expressed understanding and agreed to proceed.  Subjective:  Krista Vaughn is a 34 y.o. [phone] at [redacted]w[redacted]d being followed for ongoing prenatal care.  She is currently monitored for the following issues for this low-risk pregnancy and has Marijuana use; Anxiety during pregnancy in third trimester, antepartum; Depression complicating pregnancy in third trimester, antepartum; Supervision of normal pregnancy, antepartum; Upper respiratory infection; Gallstones; Right upper quadrant abdominal pain of unknown etiology; and Uterine fibroids affecting pregnancy in third trimester on their problem list.  Patient reports no complaints. Reports fetal movement. Denies any contractions, bleeding or leaking of fluid.   The following portions of the patient's history were reviewed and updated as appropriate: allergies, current medications, past family history, past medical history, past social history, past surgical history and problem list.   Objective:   General:  Alert, oriented and cooperative.   Mental Status: Normal mood and affect perceived. Normal judgment and thought content.  Rest of physical exam deferred due to type of encounter  Assessment and Plan:  Pregnancy: [id number] at [redacted]w[redacted]d 1. Encounter for supervision of normal pregnancy, antepartum, unspecified gravidity  2. Request for sterilization  3. Anxiety during pregnancy in third trimester, antepartum - clinically stable   Term labor  symptoms and general obstetric precautions including but not limited to vaginal bleeding, contractions, leaking of fluid and fetal movement were reviewed in detail with the patient.  I discussed the assessment and treatment plan with the patient. The patient was provided an opportunity to ask questions and all were answered. The patient agreed with the plan and demonstrated an understanding of the instructions. The patient was advised to call back or seek an in-person office evaluation/go to MAU at [facility] for any urgent or concerning symptoms. Please refer to After Visit Summary for other counseling recommendations.   I provided 10 minutes of non-face-to-face time during this encounter.  Return in about 1 week (around 09/07/2020) for Telephone OB.   Baltazar Najjar, MD Center for [facility], [place] [facility] 08/31/20

## 2020-08-31 NOTE — Progress Notes (Signed)
Virtual ROB  DE:[id number] in ankles

## 2020-09-07 ENCOUNTER — Encounter: Payer: Self-pay | Admitting: Obstetrics

## 2020-09-07 ENCOUNTER — Telehealth (INDEPENDENT_AMBULATORY_CARE_PROVIDER_SITE_OTHER): Payer: Medicaid Other | Admitting: Obstetrics

## 2020-09-07 DIAGNOSIS — Z3480 Encounter for supervision of other normal pregnancy, unspecified trimester: Secondary | ICD-10-CM

## 2020-09-07 DIAGNOSIS — Z3A38 38 weeks gestation of pregnancy: Secondary | ICD-10-CM | POA: Diagnosis not present

## 2020-09-07 DIAGNOSIS — Z3483 Encounter for supervision of other normal pregnancy, third trimester: Secondary | ICD-10-CM | POA: Diagnosis not present

## 2020-09-07 DIAGNOSIS — Z302 Encounter for sterilization: Secondary | ICD-10-CM

## 2020-09-07 NOTE — Progress Notes (Signed)
S/w pt for virtual visit. Pt states that she lost mucous plug a few days days ago.and today she has been leaking fluid and having irregular contractions since last night. Pt does not have BP cuff with her today.

## 2020-09-07 NOTE — Progress Notes (Signed)
   TELEHEALTH OBSTETRICS VISIT ENCOUNTER NOTE  I connected with JAMIRRA CURNOW on 09/07/20 at  3:30 PM EDT by telephone at home and verified that I am speaking with the correct person using two identifiers.   I discussed the limitations, risks, security and privacy concerns of performing an evaluation and management service by telephone and the availability of in person appointments. I also discussed with the patient that there may be a patient responsible charge related to this service. The patient expressed understanding and agreed to proceed.  Subjective:  Krista Vaughn is a 34 y.o. [phone] at [redacted]w[redacted]d being followed for ongoing prenatal care.  She is currently monitored for the following issues for this low-risk pregnancy and has Marijuana use; Anxiety during pregnancy in third trimester, antepartum; Depression complicating pregnancy in third trimester, antepartum; Supervision of normal pregnancy, antepartum; Upper respiratory infection; Gallstones; Right upper quadrant abdominal pain of unknown etiology; and Uterine fibroids affecting pregnancy in third trimester on their problem list.  Patient reports occasional contractions. Reports fetal movement. Denies any contractions, bleeding or leaking of fluid.   The following portions of the patient's history were reviewed and updated as appropriate: allergies, current medications, past family history, past medical history, past social history, past surgical history and problem list.   Objective:   General:  Alert, oriented and cooperative.   Mental Status: Normal mood and affect perceived. Normal judgment and thought content.  Rest of physical exam deferred due to type of encounter  Assessment and Plan:  Pregnancy: [id number] at [redacted]w[redacted]d 1. Supervision of other normal pregnancy, antepartum  2. Request for sterilization   Term labor symptoms and general obstetric precautions including but not limited to vaginal bleeding, contractions,  leaking of fluid and fetal movement were reviewed in detail with the patient.  I discussed the assessment and treatment plan with the patient. The patient was provided an opportunity to ask questions and all were answered. The patient agreed with the plan and demonstrated an understanding of the instructions. The patient was advised to call back or seek an in-person office evaluation/go to MAU at [facility] for any urgent or concerning symptoms. Please refer to After Visit Summary for other counseling recommendations.   I provided 10 minutes of non-face-to-face time during this encounter.  Return today (on 09/07/2020) for [place].    Baltazar Najjar, MD Center for [facility], [place] [facility] 09/07/20

## 2020-09-12 ENCOUNTER — Inpatient Hospital Stay (HOSPITAL_COMMUNITY): Payer: Medicaid Other | Admitting: Anesthesiology

## 2020-09-12 ENCOUNTER — Encounter (HOSPITAL_COMMUNITY): Payer: Self-pay | Admitting: Obstetrics and Gynecology

## 2020-09-12 ENCOUNTER — Other Ambulatory Visit: Payer: Self-pay

## 2020-09-12 ENCOUNTER — Inpatient Hospital Stay (HOSPITAL_COMMUNITY)
Admission: AD | Admit: 2020-09-12 | Discharge: 2020-09-14 | DRG: 797 | Disposition: A | Discharge status: Home / Self Care | Payer: Medicaid Other | Attending: Obstetrics and Gynecology | Admitting: Obstetrics and Gynecology

## 2020-09-12 DIAGNOSIS — Z302 Encounter for sterilization: Secondary | ICD-10-CM

## 2020-09-12 DIAGNOSIS — Z20822 Contact with and (suspected) exposure to covid-19: Secondary | ICD-10-CM | POA: Diagnosis present

## 2020-09-12 DIAGNOSIS — O3413 Maternal care for benign tumor of corpus uteri, third trimester: Secondary | ICD-10-CM | POA: Diagnosis present

## 2020-09-12 DIAGNOSIS — Z3A38 38 weeks gestation of pregnancy: Secondary | ICD-10-CM | POA: Diagnosis not present

## 2020-09-12 DIAGNOSIS — O9081 Anemia of the puerperium: Secondary | ICD-10-CM | POA: Diagnosis not present

## 2020-09-12 DIAGNOSIS — Z87891 Personal history of nicotine dependence: Secondary | ICD-10-CM | POA: Diagnosis not present

## 2020-09-12 DIAGNOSIS — F1290 Cannabis use, unspecified, uncomplicated: Secondary | ICD-10-CM | POA: Diagnosis present

## 2020-09-12 DIAGNOSIS — O99324 Drug use complicating childbirth: Secondary | ICD-10-CM | POA: Diagnosis present

## 2020-09-12 DIAGNOSIS — Z9851 Tubal ligation status: Secondary | ICD-10-CM

## 2020-09-12 DIAGNOSIS — D259 Leiomyoma of uterus, unspecified: Secondary | ICD-10-CM | POA: Diagnosis present

## 2020-09-12 DIAGNOSIS — F329 Major depressive disorder, single episode, unspecified: Secondary | ICD-10-CM | POA: Diagnosis present

## 2020-09-12 DIAGNOSIS — O99344 Other mental disorders complicating childbirth: Secondary | ICD-10-CM | POA: Diagnosis present

## 2020-09-12 DIAGNOSIS — O26893 Other specified pregnancy related conditions, third trimester: Secondary | ICD-10-CM | POA: Diagnosis present

## 2020-09-12 DIAGNOSIS — D62 Acute posthemorrhagic anemia: Secondary | ICD-10-CM | POA: Diagnosis not present

## 2020-09-12 DIAGNOSIS — O99343 Other mental disorders complicating pregnancy, third trimester: Secondary | ICD-10-CM | POA: Diagnosis present

## 2020-09-12 DIAGNOSIS — F419 Anxiety disorder, unspecified: Secondary | ICD-10-CM | POA: Diagnosis present

## 2020-09-12 DIAGNOSIS — Z3490 Encounter for supervision of normal pregnancy, unspecified, unspecified trimester: Secondary | ICD-10-CM

## 2020-09-12 DIAGNOSIS — O4202 Full-term premature rupture of membranes, onset of labor within 24 hours of rupture: Secondary | ICD-10-CM | POA: Diagnosis not present

## 2020-09-12 LAB — CBC
HCT: 30.6 % — ABNORMAL LOW (ref 36.0–46.0)
Hemoglobin: 9.2 g/dL — ABNORMAL LOW (ref 12.0–15.0)
MCH: 25.1 pg — ABNORMAL LOW (ref 26.0–34.0)
MCHC: 30.1 g/dL (ref 30.0–36.0)
MCV: 83.4 fL (ref 80.0–100.0)
Platelets: 280 10*3/uL (ref 150–400)
RBC: 3.67 MIL/uL — ABNORMAL LOW (ref 3.87–5.11)
RDW: 14.1 % (ref 11.5–15.5)
WBC: 11.3 10*3/uL — ABNORMAL HIGH (ref 4.0–10.5)
nRBC: 0 % (ref 0.0–0.2)

## 2020-09-12 LAB — TYPE AND SCREEN
ABO/RH(D): O POS
Antibody Screen: NEGATIVE

## 2020-09-12 LAB — SARS CORONAVIRUS 2 BY RT PCR (HOSPITAL ORDER, PERFORMED IN ~~LOC~~ HOSPITAL LAB): SARS Coronavirus 2: NEGATIVE

## 2020-09-12 LAB — RPR: RPR Ser Ql: NONREACTIVE

## 2020-09-12 MED ORDER — ACETAMINOPHEN 325 MG PO TABS: 650.0000 mg | ORAL_TABLET | ORAL | Status: DC | PRN

## 2020-09-12 MED ORDER — SIMETHICONE 80 MG PO CHEW
80.0000 mg | CHEWABLE_TABLET | ORAL | Status: DC | PRN
  Administered 2020-09-13 – 2020-09-14 (×2): 80 mg via ORAL
  Filled 2020-09-12 (×2): qty 1

## 2020-09-12 MED ORDER — FENTANYL CITRATE (PF) 100 MCG/2ML IJ SOLN
INTRAMUSCULAR | Status: AC
Start: 2020-09-12 — End: 2020-09-12
  Filled 2020-09-12: qty 2

## 2020-09-12 MED ORDER — IBUPROFEN 600 MG PO TABS
600.0000 mg | ORAL_TABLET | Freq: Four times a day (QID) | ORAL | Status: DC
  Administered 2020-09-12 – 2020-09-13 (×3): 600 mg via ORAL
  Filled 2020-09-12 (×3): qty 1

## 2020-09-12 MED ORDER — TETANUS-DIPHTH-ACELL PERTUSSIS 5-2.5-18.5 LF-MCG/0.5 IM SUSP: 0.5000 mL | Freq: Once | INTRAMUSCULAR | Status: DC

## 2020-09-12 MED ORDER — OXYCODONE-ACETAMINOPHEN 5-325 MG PO TABS: 2.0000 | ORAL_TABLET | ORAL | Status: DC | PRN

## 2020-09-12 MED ORDER — COCONUT OIL OIL: 1.0000 "application " | TOPICAL_OIL | Status: DC | PRN

## 2020-09-12 MED ORDER — PRENATAL MULTIVITAMIN CH
1.0000 | ORAL_TABLET | Freq: Every day | ORAL | Status: DC
  Administered 2020-09-14: 1 via ORAL
  Filled 2020-09-12: qty 1

## 2020-09-12 MED ORDER — DIBUCAINE (PERIANAL) 1 % EX OINT
1.0000 "application " | TOPICAL_OINTMENT | CUTANEOUS | Status: DC | PRN
  Administered 2020-09-14: 1 via RECTAL
  Filled 2020-09-12 (×2): qty 28

## 2020-09-12 MED ORDER — BENZOCAINE-MENTHOL 20-0.5 % EX AERO: 1.0000 "application " | INHALATION_SPRAY | CUTANEOUS | Status: DC | PRN

## 2020-09-12 MED ORDER — SENNOSIDES-DOCUSATE SODIUM 8.6-50 MG PO TABS
2.0000 | ORAL_TABLET | ORAL | Status: DC
  Administered 2020-09-13 – 2020-09-14 (×2): 2 via ORAL
  Filled 2020-09-12 (×2): qty 2

## 2020-09-12 MED ORDER — ACETAMINOPHEN 325 MG PO TABS
650.0000 mg | ORAL_TABLET | ORAL | Status: DC | PRN
  Administered 2020-09-12 – 2020-09-13 (×2): 650 mg via ORAL
  Filled 2020-09-12 (×2): qty 2

## 2020-09-12 MED ORDER — FENTANYL CITRATE (PF) 100 MCG/2ML IJ SOLN
50.0000 ug | INTRAMUSCULAR | Status: DC | PRN
  Administered 2020-09-12: 100 ug via INTRAVENOUS

## 2020-09-12 MED ORDER — OXYCODONE-ACETAMINOPHEN 5-325 MG PO TABS
1.0000 | ORAL_TABLET | ORAL | Status: DC | PRN
  Administered 2020-09-12 – 2020-09-13 (×2): 1 via ORAL
  Filled 2020-09-12 (×2): qty 1

## 2020-09-12 MED ORDER — EPHEDRINE 5 MG/ML INJ: 10.0000 mg | INTRAVENOUS | Status: DC | PRN

## 2020-09-12 MED ORDER — DIPHENHYDRAMINE HCL 25 MG PO CAPS
25.0000 mg | ORAL_CAPSULE | Freq: Four times a day (QID) | ORAL | Status: DC | PRN
  Administered 2020-09-13: 25 mg via ORAL
  Filled 2020-09-12: qty 1

## 2020-09-12 MED ORDER — PHENYLEPHRINE 40 MCG/ML (10ML) SYRINGE FOR IV PUSH (FOR BLOOD PRESSURE SUPPORT): 80.0000 ug | PREFILLED_SYRINGE | INTRAVENOUS | Status: DC | PRN

## 2020-09-12 MED ORDER — ONDANSETRON HCL 4 MG/2ML IJ SOLN: 4.0000 mg | INTRAMUSCULAR | Status: DC | PRN

## 2020-09-12 MED ORDER — ONDANSETRON HCL 4 MG PO TABS: 4.0000 mg | ORAL_TABLET | ORAL | Status: DC | PRN

## 2020-09-12 MED ORDER — LIDOCAINE HCL (PF) 1 % IJ SOLN
INTRAMUSCULAR | Status: DC | PRN
  Administered 2020-09-12: 12 mL via EPIDURAL

## 2020-09-12 MED ORDER — SOD CITRATE-CITRIC ACID 500-334 MG/5ML PO SOLN: 30.0000 mL | ORAL | Status: DC | PRN

## 2020-09-12 MED ORDER — OXYCODONE-ACETAMINOPHEN 5-325 MG PO TABS
2.0000 | ORAL_TABLET | ORAL | Status: DC | PRN
  Administered 2020-09-12: 2 via ORAL
  Filled 2020-09-12: qty 2

## 2020-09-12 MED ORDER — LACTATED RINGERS IV SOLN: INTRAVENOUS | Status: DC

## 2020-09-12 MED ORDER — WITCH HAZEL-GLYCERIN EX PADS
1.0000 "application " | MEDICATED_PAD | CUTANEOUS | Status: DC | PRN
  Administered 2020-09-13 – 2020-09-14 (×2): 1 via TOPICAL

## 2020-09-12 MED ORDER — SODIUM CHLORIDE (PF) 0.9 % IJ SOLN
INTRAMUSCULAR | Status: DC | PRN
  Administered 2020-09-12: 12 mL/h via EPIDURAL

## 2020-09-12 MED ORDER — ONDANSETRON HCL 4 MG/2ML IJ SOLN: 4.0000 mg | Freq: Four times a day (QID) | INTRAMUSCULAR | Status: DC | PRN

## 2020-09-12 MED ORDER — OXYCODONE-ACETAMINOPHEN 5-325 MG PO TABS: 1.0000 | ORAL_TABLET | ORAL | Status: DC | PRN

## 2020-09-12 MED ORDER — FENTANYL-BUPIVACAINE-NACL 0.5-0.125-0.9 MG/250ML-% EP SOLN
12.0000 mL/h | EPIDURAL | Status: DC | PRN
  Filled 2020-09-12: qty 250

## 2020-09-12 MED ORDER — LACTATED RINGERS IV SOLN: 500.0000 mL | INTRAVENOUS | Status: DC | PRN

## 2020-09-12 MED ORDER — LIDOCAINE HCL (PF) 1 % IJ SOLN
30.0000 mL | INTRAMUSCULAR | Status: AC | PRN
  Administered 2020-09-12: 30 mL via SUBCUTANEOUS
  Filled 2020-09-12: qty 30

## 2020-09-12 MED ORDER — OXYTOCIN-SODIUM CHLORIDE 30-0.9 UT/500ML-% IV SOLN
2.5000 [IU]/h | INTRAVENOUS | Status: DC
  Filled 2020-09-12: qty 500

## 2020-09-12 MED ORDER — LACTATED RINGERS IV SOLN: 500.0000 mL | Freq: Once | INTRAVENOUS | Status: DC

## 2020-09-12 MED ORDER — DIPHENHYDRAMINE HCL 50 MG/ML IJ SOLN: 12.5000 mg | INTRAMUSCULAR | Status: DC | PRN

## 2020-09-12 MED ORDER — OXYTOCIN BOLUS FROM INFUSION
333.0000 mL | Freq: Once | INTRAVENOUS | Status: AC
  Administered 2020-09-12: 333 mL via INTRAVENOUS

## 2020-09-12 NOTE — MAU Note (Signed)
Krista Vaughn is a 34 y.o. at [redacted]w[redacted]d here in MAU reporting: contractions Denies VB/ LOF  Onset of complaint: 0400 Pain score:10

## 2020-09-12 NOTE — H&P (Signed)
Krista Vaughn is a 16 y.I.[id number] at [redacted]w[redacted]d female presenting for SOL with SROM in MAU. Her contractions began overnight, strengthening and becoming regular at 4am. She endorses fetal movement, denied LOF or VB at onset of MAU visit.  OB History    Gravida  6   Para  3   Term  3   Preterm      AB  2   Living  3     SAB  0   TAB  2   Ectopic      Multiple      Live Births  3          Past Medical History:  Diagnosis Date  . Anxiety   . Bronchitis   . Chlamydia   . Cholecystitis   . Constipation   . Depression   . GERD (gastroesophageal reflux disease)   . H. pylori infection   . Migraine   . Uterine fibroid    Past Surgical History:  Procedure Laterality Date  . CHOLECYSTECTOMY N/A 04/26/2020   Procedure: LAPAROSCOPIC CHOLECYSTECTOMY;  Surgeon: Kinsinger, Arta Bruce, MD;  Location: [facility];  Service: General;  Laterality: N/A;  . INDUCED ABORTION    . NO PAST SURGERIES    . THERAPEUTIC ABORTION     Family History: family history includes Down syndrome in her sister. Social History:  reports that she quit smoking about 6 years ago. Her smoking use included cigarettes. She quit after 4.00 years of use. She has never used smokeless tobacco. She reports previous alcohol use. She reports that she does not use drugs.     Maternal Diabetes: No Genetic Screening: Normal Maternal Ultrasounds/Referrals: Normal Fetal Ultrasounds or other Referrals:  None Maternal Substance Abuse:  No Significant Maternal Medications:  None Significant Maternal Lab Results:  Group B Strep negative Other Comments:  Small anterior fibroid  Review of Systems  All other systems reviewed and are negative.  Maternal Medical History:  Reason for admission: Contractions.   Contractions: Onset was 6-12 hours ago.   Frequency: regular.   Perceived severity is moderate.    Fetal activity: Perceived fetal activity is normal.   Last perceived fetal movement was within the past hour.     Prenatal Complications - Diabetes: none.    Dilation: 7.5 Effacement (%): 90 Station: 0 Exam by:: Candie Chroman, CNM Blood pressure 119/63, pulse 76, temperature 98.9 F (37.2 C), temperature source Oral, resp. rate 18, last menstrual period 12/15/2019, SpO2 100 %. Maternal Exam:  Uterine Assessment: Contraction strength is firm.  Contraction frequency is regular.   Abdomen: Patient reports no abdominal tenderness. Fetal presentation: vertex  Introitus: Normal vulva. Normal vagina.  Amniotic fluid character: clear.  Pelvis: adequate for delivery.   Cervix: Cervix evaluated by digital exam.     Fetal Exam Fetal Monitor Review: Mode: ultrasound.   Baseline rate: 130.  Variability: moderate (6-25 bpm).   Pattern: accelerations present and no decelerations.    Fetal State Assessment: Category I - tracings are normal.     Physical Exam Vitals and nursing note reviewed.  Constitutional:      Appearance: Normal appearance. She is normal weight.  Eyes:     Pupils: Pupils are equal, round, and reactive to light.  Cardiovascular:     Rate and Rhythm: Normal rate and regular rhythm.     Pulses: Normal pulses.  Pulmonary:     Effort: Pulmonary effort is normal.  Genitourinary:    General: Normal vulva.  Musculoskeletal:  General: No swelling or tenderness. Normal range of motion.  Skin:    General: Skin is warm and dry.     Capillary Refill: Capillary refill takes less than 2 seconds.     Findings: No rash.  Neurological:     Mental Status: She is alert and oriented to person, place, and time.  Psychiatric:        Mood and Affect: Mood normal.        Behavior: Behavior normal.        Thought Content: Thought content normal.        Judgment: Judgment normal.     Prenatal labs: ABO, Rh: --/--/O POS (09/13 0813) Antibody: NEG (09/13 0813) Rubella: 3.07 (02/23 0942) RPR: Non Reactive (06/29 1016)  HBsAg: Negative (02/23 0942)  HIV: Non Reactive (06/29 1016)   GBS: Negative/-- (08/25 1134)   Assessment/Plan: [id number] at [redacted]w[redacted]d in active labor Admit to L&D  Epidural on request Expectant management of active labor; anticipate NSVD  Gaylan Gerold, CNM, MSN, [facility] 09/12/20 9:47 AM

## 2020-09-12 NOTE — Anesthesia Preprocedure Evaluation (Signed)
Anesthesia Evaluation  Patient identified by MRN, date of birth, ID band Patient awake    Reviewed: Allergy & Precautions, NPO status , Patient's Chart, lab work & pertinent test results  Airway Mallampati: II  TM Distance: >3 FB Neck ROM: Full    Dental no notable dental hx.    Pulmonary neg pulmonary ROS, former smoker,    Pulmonary exam normal breath sounds clear to auscultation       Cardiovascular negative cardio ROS Normal cardiovascular exam Rhythm:Regular Rate:Normal     Neuro/Psych  Headaches, PSYCHIATRIC DISORDERS Anxiety Depression    GI/Hepatic GERD  Medicated,(+)     substance abuse  marijuana use,   Endo/Other  negative endocrine ROS  Renal/GU negative Renal ROS  negative genitourinary   Musculoskeletal negative musculoskeletal ROS (+)   Abdominal   Peds  Hematology  (+) Blood dyscrasia (Hgb 9.2), anemia ,   Anesthesia Other Findings   Reproductive/Obstetrics (+) Pregnancy                             Anesthesia Physical Anesthesia Plan  ASA: II  Anesthesia Plan: Epidural   Post-op Pain Management:    Induction:   PONV Risk Score and Plan: Treatment may vary due to age or medical condition  Airway Management Planned: Natural Airway  Additional Equipment:   Intra-op Plan:   Post-operative Plan:   Informed Consent: I have reviewed the patients History and Physical, chart, labs and discussed the procedure including the risks, benefits and alternatives for the proposed anesthesia with the patient or authorized representative who has indicated his/her understanding and acceptance.       Plan Discussed with: Anesthesiologist  Anesthesia Plan Comments: (Patient identified. Risks, benefits, options discussed with patient including but not limited to bleeding, infection, nerve damage, paralysis, failed block, incomplete pain control, headache, blood pressure  changes, nausea, vomiting, reactions to medication, itching, and post partum back pain. Confirmed with bedside nurse the patient's most recent platelet count. Confirmed with the patient that they are not taking any anticoagulation, have any bleeding history or any family history of bleeding disorders. Patient expressed understanding and wishes to proceed. All questions were answered. )        Anesthesia Quick Evaluation

## 2020-09-12 NOTE — Anesthesia Procedure Notes (Signed)
Epidural Patient location during procedure: OB Start time: 09/12/2020 8:50 AM End time: 09/12/2020 9:00 AM  Staffing Anesthesiologist: Freddrick March, MD Performed: anesthesiologist   Preanesthetic Checklist Completed: patient identified, IV checked, risks and benefits discussed, monitors and equipment checked, pre-op evaluation and timeout performed  Epidural Patient position: sitting Prep: DuraPrep and site prepped and draped Patient monitoring: continuous pulse ox, blood pressure, heart rate and cardiac monitor Approach: midline Location: L3-L4 Injection technique: LOR air  Needle:  Needle type: Tuohy  Needle gauge: 17 G Needle length: 9 cm Needle insertion depth: 4 cm Catheter type: closed end flexible Catheter size: 19 Gauge Catheter at skin depth: 11 cm Test dose: negative  Assessment Sensory level: T8 Events: blood not aspirated, injection not painful, no injection resistance, no paresthesia and negative IV test  Additional Notes Patient identified. Risks/Benefits/Options discussed with patient including but not limited to bleeding, infection, nerve damage, paralysis, failed block, incomplete pain control, headache, blood pressure changes, nausea, vomiting, reactions to medication both or allergic, itching and postpartum back pain. Confirmed with bedside nurse the patient's most recent platelet count. Confirmed with patient that they are not currently taking any anticoagulation, have any bleeding history or any family history of bleeding disorders. Patient expressed understanding and wished to proceed. All questions were answered. Sterile technique was used throughout the entire procedure. Please see nursing notes for vital signs. Test dose was given through epidural catheter and negative prior to continuing to dose epidural or start infusion. Warning signs of high block given to the patient including shortness of breath, tingling/numbness in hands, complete motor block,  or any concerning symptoms with instructions to call for help. Patient was given instructions on fall risk and not to get out of bed. All questions and concerns addressed with instructions to call with any issues or inadequate analgesia.  Reason for block:procedure for pain

## 2020-09-12 NOTE — Discharge Summary (Signed)
Postpartum Discharge Summary  Date of Service updated 09/14/20     Patient Name: Krista Vaughn DOB: 01-Nov-1986 MRN: [id number]  Date of admission: 09/12/2020 Delivery date:09/12/2020  Delivering provider: Gaylan Gerold R  Date of discharge: 09/14/2020  Admitting diagnosis: Normal labor [O80, Z37.9] Intrauterine pregnancy: [redacted]w[redacted]d    Secondary diagnosis:  Active Problems:   Marijuana use   Anxiety during pregnancy in third trimester, antepartum   Depression complicating pregnancy in third trimester, antepartum   Supervision of normal pregnancy, antepartum   Uterine fibroids affecting pregnancy in third trimester   Normal labor   History of bilateral tubal ligation  Additional problems: None    Discharge diagnosis: Term Pregnancy Delivered                                              Post partum procedures:None Augmentation: N/A Complications: None  Hospital course: Onset of Labor With Vaginal Delivery      34y.o. yo [id number] admitted in Active Labor on 09/12/2020. Patient had an uncomplicated labor course as follows:  Membrane Rupture Time/Date: 8:05 AM ,09/12/2020   Delivery Method:Vaginal, Spontaneous  Episiotomy: None  Lacerations:  1st degree;Perineal  Patient had an uncomplicated postpartum course.  She is ambulating, tolerating a regular diet, passing flatus, and urinating well. Patient is discharged home in stable condition on 09/14/20.  Newborn Data: Birth date:09/12/2020  Birth time:11:04 AM  Gender:Female  Living status:Living  Apgars:8 ,9  Weight:2554 g   Magnesium Sulfate received: No BMZ received: No Rhophylac:N/A MMR:N/A T-DaP:Declined Flu: No Transfusion:No  Physical exam  Vitals:   09/13/20 1520 09/13/20 1920 09/13/20 2320 09/14/20 0320  BP: 113/75 103/69 101/70 98/62  Pulse: 80 79 80 72  Resp: _0 Temp: 97.7 F (36.5 C) 98.8 F (37.1 C) 98.6 F (37 C) 97.9 F (36.6 C)  TempSrc: Oral Oral Oral Oral  SpO2: 100% 100%  100% 100%  Weight:      Height:       General: alert, cooperative and no distress Lochia: appropriate Uterine Fundus: firm, abdomen distended Incision: honeycomb dressing c/d/i over umbilicus DVT Evaluation: No evidence of DVT seen on physical exam. Labs: Lab Results  Component Value Date   WBC 12.9 (H) 09/13/2020   HGB 8.1 (L) 09/13/2020   HCT 26.9 (L) 09/13/2020   MCV 84.1 09/13/2020   PLT 249 09/13/2020   CMP Latest Ref Rng & Units 05/10/2020  Glucose 65 - 99 mg/dL 84  BUN 6 - 20 mg/dL 4(L)  Creatinine 0.57 - 1.00 mg/dL 0.48(L)  Sodium 134 - 144 mmol/L 140  Potassium 3.5 - 5.2 mmol/L 4.4  Chloride 96 - 106 mmol/L 105  CO2 20 - 29 mmol/L 20  Calcium 8.7 - 10.2 mg/dL 9.1  Total Protein 6.0 - 8.5 g/dL 6.5  Total Bilirubin 0.0 - 1.2 mg/dL <0.2  Alkaline Phos 39 - 117 IU/L 69  AST 0 - 40 IU/L 14  ALT 0 - 32 IU/L 10   Edinburgh Score: Edinburgh Postnatal Depression Scale Screening Tool 09/12/2020  I have been able to laugh and see the funny side of things. (No Data)     After visit meds:  Allergies as of 09/14/2020      Reactions   Ambien [zolpidem] Other (See Comments)   Pt reports intolerance due to dizziness; prefers not to take.  Decadron [dexamethasone] Other (See Comments)   Makes her skin crawl      Medication List    STOP taking these medications   PRENATAL VITAMIN PO     TAKE these medications   acetaminophen 500 MG tablet Commonly known as: TYLENOL Take 500-1,000 mg by mouth every 6 (six) hours as needed (pain.).   Blood Pressure Monitor Kit 1 Device by Does not apply route once a week. To be monitored Regularly at home.   [place] Supp Sm Misc Wear as directed.   diphenhydrAMINE 25 MG tablet Commonly known as: BENADRYL Take 25 mg by mouth every 6 (six) hours as needed (allergies.).   docusate sodium 100 MG capsule Commonly known as: Colace Take 1 capsule (100 mg total) by mouth daily.   ferrous sulfate 325 (65 FE) MG  tablet Take 1 tablet (325 mg total) by mouth 2 (two) times daily with a meal.   hydrOXYzine 25 MG capsule Commonly known as: Vistaril Take 1 capsule (25 mg total) by mouth at bedtime as needed for anxiety.   ibuprofen 800 MG tablet Commonly known as: ADVIL Take 1 tablet (800 mg total) by mouth every 8 (eight) hours.   omeprazole 20 MG capsule Commonly known as: PriLOSEC Take 1 capsule (20 mg total) by mouth 2 (two) times daily before a meal.        Discharge home in stable condition Infant Feeding: Bottle Infant Disposition:home with mother Discharge instruction: per After Visit Summary and Postpartum booklet. Activity: Advance as tolerated. Pelvic rest for 6 weeks.  Diet: routine diet Future Appointments: Future Appointments  Date Time Provider [place]  09/26/2020 10:45 AM Lynnea Ferrier, LCSW [facility] None  10/11/2020  9:00 AM Shelly Bombard, MD [facility] None   Follow up Visit:   Please schedule this patient for a In person postpartum visit in 4 weeks with the following provider: Any provider. Additional Postpartum F/U:Postpartum Depression checkup  Low risk pregnancy complicated by: None Delivery mode:  Vaginal, Spontaneous  Anticipated Birth Control:  BTL done PP   Arrie Senate, MD OB Fellow, [place] for [place] 09/14/2020 6:33 AM

## 2020-09-12 NOTE — Discharge Instructions (Signed)
Postpartum Care After Vaginal Delivery This sheet gives you information about how to care for yourself from the time you deliver your baby to up to 6-12 weeks after delivery (postpartum period). Your health care provider may also give you more specific instructions. If you have problems or questions, contact your health care provider. Follow these instructions at home: Vaginal bleeding  It is normal to have vaginal bleeding (lochia) after delivery. Wear a sanitary pad for vaginal bleeding and discharge. ? During the first week after delivery, the amount and appearance of lochia is often similar to a menstrual period. ? Over the next few weeks, it will gradually decrease to a dry, yellow-brown discharge. ? For most women, lochia stops completely by 4-6 weeks after delivery. Vaginal bleeding can vary from woman to woman.  Change your sanitary pads frequently. Watch for any changes in your flow, such as: ? A sudden increase in volume. ? A change in color. ? Large blood clots.  If you pass a blood clot from your vagina, save it and call your health care provider to discuss. Do not flush blood clots down the toilet before talking with your health care provider.  Do not use tampons or douches until your health care provider says this is safe.  If you are not breastfeeding, your period should return 6-8 weeks after delivery. If you are feeding your child breast milk only (exclusive breastfeeding), your period may not return until you stop breastfeeding. Perineal care  Keep the area between the vagina and the anus (perineum) clean and dry as told by your health care provider. Use medicated pads and pain-relieving sprays and creams as directed.  If you had a cut in the perineum (episiotomy) or a tear in the vagina, check the area for signs of infection until you are healed. Check for: ? More redness, swelling, or pain. ? Fluid or blood coming from the cut or tear. ? Warmth. ? Pus or a bad  smell.  You may be given a squirt bottle to use instead of wiping to clean the perineum area after you go to the bathroom. As you start healing, you may use the squirt bottle before wiping yourself. Make sure to wipe gently.  To relieve pain caused by an episiotomy, a tear in the vagina, or swollen veins in the anus (hemorrhoids), try taking a warm sitz bath 2-3 times a day. A sitz bath is a warm water bath that is taken while you are sitting down. The water should only come up to your hips and should cover your buttocks. Breast care  Within the first few days after delivery, your breasts may feel heavy, full, and uncomfortable (breast engorgement). Milk may also leak from your breasts. Your health care provider can suggest ways to help relieve the discomfort. Breast engorgement should go away within a few days.  If you are breastfeeding: ? Wear a bra that supports your breasts and fits you well. ? Keep your nipples clean and dry. Apply creams and ointments as told by your health care provider. ? You may need to use breast pads to absorb milk that leaks from your breasts. ? You may have uterine contractions every time you breastfeed for up to several weeks after delivery. Uterine contractions help your uterus return to its normal size. ? If you have any problems with breastfeeding, work with your health care provider or Science writer.  If you are not breastfeeding: ? Avoid touching your breasts a lot. Doing this can make  your breasts produce more milk. ? Wear a good-fitting bra and use cold packs to help with swelling. ? Do not squeeze out (express) milk. This causes you to make more milk. Intimacy and sexuality  Ask your health care provider when you can engage in sexual activity. This may depend on: ? Your risk of infection. ? How fast you are healing. ? Your comfort and desire to engage in sexual activity.  You are able to get pregnant after delivery, even if you have not had  your period. If desired, talk with your health care provider about methods of birth control (contraception). Medicines  Take over-the-counter and prescription medicines only as told by your health care provider.  If you were prescribed an antibiotic medicine, take it as told by your health care provider. Do not stop taking the antibiotic even if you start to feel better. Activity  Gradually return to your normal activities as told by your health care provider. Ask your health care provider what activities are safe for you.  Rest as much as possible. Try to rest or take a nap while your baby is sleeping. Eating and drinking   Drink enough fluid to keep your urine pale yellow.  Eat high-fiber foods every day. These may help prevent or relieve constipation. High-fiber foods include: ? Whole grain cereals and breads. ? Brown rice. ? Beans. ? Fresh fruits and vegetables.  Do not try to lose weight quickly by cutting back on calories.  Take your prenatal vitamins until your postpartum checkup or until your health care provider tells you it is okay to stop. Lifestyle  Do not use any products that contain nicotine or tobacco, such as cigarettes and e-cigarettes. If you need help quitting, ask your health care provider.  Do not drink alcohol, especially if you are breastfeeding. General instructions  Keep all follow-up visits for you and your baby as told by your health care provider. Most women visit their health care provider for a postpartum checkup within the first 3-6 weeks after delivery. Contact a health care provider if:  You feel unable to cope with the changes that your child brings to your life, and these feelings do not go away.  You feel unusually sad or worried.  Your breasts become red, painful, or hard.  You have a fever.  You have trouble holding urine or keeping urine from leaking.  You have little or no interest in activities you used to enjoy.  You have not  breastfed at all and you have not had a menstrual period for 12 weeks after delivery.  You have stopped breastfeeding and you have not had a menstrual period for 12 weeks after you stopped breastfeeding.  You have questions about caring for yourself or your baby.  You pass a blood clot from your vagina. Get help right away if:  You have chest pain.  You have difficulty breathing.  You have sudden, severe leg pain.  You have severe pain or cramping in your lower abdomen.  You bleed from your vagina so much that you fill more than one sanitary pad in one hour. Bleeding should not be heavier than your heaviest period.  You develop a severe headache.  You faint.  You have blurred vision or spots in your vision.  You have bad-smelling vaginal discharge.  You have thoughts about hurting yourself or your baby. If you ever feel like you may hurt yourself or others, or have thoughts about taking your own life, get help  right away. You can go to the nearest emergency department or call:  Your local emergency services (911 in the [place]  A suicide crisis helpline, such as the [place] at 773-567-8467. This is open 24 hours a day. Summary  The period of time right after you deliver your newborn up to 6-12 weeks after delivery is called the postpartum period.  Gradually return to your normal activities as told by your health care provider.  Keep all follow-up visits for you and your baby as told by your health care provider. This information is not intended to replace advice given to you by your health care provider. Make sure you discuss any questions you have with your health care provider. Document Revised: 12/20/2017 Document Reviewed: 09/30/2017 Elsevier Patient Education  2020 [employer].   Postpartum Baby Blues The postpartum period begins right after the birth of a baby. During this time, there is often a lot of joy and excitement. It is also  a time of many changes in the life of the parents. No matter how many times a mother gives birth, each child brings new challenges to the family, including different ways of relating to one another. It is common to have feelings of excitement along with confusing changes in moods, emotions, and thoughts. You may feel happy one minute and sad or stressed the next. These feelings of sadness usually happen in the period right after you have your baby, and they go away within a week or two. This is called the "baby blues." What are the causes? There is no known cause of baby blues. It is likely caused by a combination of factors. However, changes in hormone levels after childbirth are believed to trigger some of the symptoms. Other factors that can play a role in these mood changes include:  Lack of sleep.  Stressful life events, such as poverty, caring for a loved one, or death of a loved one.  Genetics. What are the signs or symptoms? Symptoms of this condition include:  Brief changes in mood, such as going from extreme happiness to sadness.  Decreased concentration.  Difficulty sleeping.  Crying spells and tearfulness.  Loss of appetite.  Irritability.  Anxiety. If the symptoms of baby blues last for more than 2 weeks or become more severe, you may have postpartum depression. How is this diagnosed? This condition is diagnosed based on an evaluation of your symptoms. There are no medical or lab tests that lead to a diagnosis, but there are various questionnaires that a health care provider may use to identify women with the baby blues or postpartum depression. How is this treated? Treatment is not needed for this condition. The baby blues usually go away on their own in 1-2 weeks. Social support is often all that is needed. You will be encouraged to get adequate sleep and rest. Follow these instructions at home: Lifestyle      Get as much rest as you can. Take a nap when the baby  sleeps.  Exercise regularly as told by your health care provider. Some women find yoga and walking to be helpful.  Eat a balanced and nourishing diet. This includes plenty of fruits and vegetables, whole grains, and lean proteins.  Do little things that you enjoy. Have a cup of tea, take a bubble bath, read your favorite magazine, or listen to your favorite music.  Avoid alcohol.  Ask for help with household chores, cooking, grocery shopping, or running errands. Do not try  do everything yourself. Consider hiring a postpartum doula to help. This is a professional who specializes in providing support to new mothers.  Try not to make any major life changes during pregnancy or right after giving birth. This can add stress. General instructions  Talk to people close to you about how you are feeling. Get support from your partner, family members, friends, or other new moms. You may want to join a support group.  Find ways to cope with stress. This may include: ? Writing your thoughts and feelings in a journal. ? Spending time outside. ? Spending time with people who make you laugh.  Try to stay positive in how you think. Think about the things you are grateful for.  Take over-the-counter and prescription medicines only as told by your health care provider.  Let your health care provider know if you have any concerns.  Keep all postpartum visits as told by your health care provider. This is important. Contact a health care provider if:  Your baby blues do not go away after 2 weeks. Get help right away if:  You have thoughts of taking your own life (suicidal thoughts).  You think you may harm the baby or other people.  You see or hear things that are not there (hallucinations). Summary  After giving birth, you may feel happy one minute and sad or stressed the next. Feelings of sadness that happen right after the baby is born and go away after a week or two are called the "baby  blues."  You can manage the baby blues by getting enough rest, eating a healthy diet, exercising, spending time with supportive people, and finding ways to cope with stress.  If feelings of sadness and stress last longer than 2 weeks or get in the way of caring for your baby, talk to your health care provider. This may mean you have postpartum depression. This information is not intended to replace advice given to you by your health care provider. Make sure you discuss any questions you have with your health care provider. Document Revised: 04/10/2019 Document Reviewed: 02/12/2017 Elsevier Patient Education  2020 Elsevier Inc[employer]  

## 2020-09-13 ENCOUNTER — Encounter (HOSPITAL_COMMUNITY)
Admission: AD | Disposition: A | Discharge status: Home / Self Care | Payer: Self-pay | Source: Home / Self Care | Attending: Obstetrics and Gynecology

## 2020-09-13 ENCOUNTER — Encounter (HOSPITAL_COMMUNITY): Payer: Self-pay | Admitting: Obstetrics and Gynecology

## 2020-09-13 ENCOUNTER — Inpatient Hospital Stay (HOSPITAL_COMMUNITY): Payer: Medicaid Other | Admitting: Anesthesiology

## 2020-09-13 DIAGNOSIS — Z302 Encounter for sterilization: Secondary | ICD-10-CM

## 2020-09-13 HISTORY — PX: TUBAL LIGATION: SHX77

## 2020-09-13 LAB — CBC
HCT: 25.3 % — ABNORMAL LOW (ref 36.0–46.0)
HCT: 26.9 % — ABNORMAL LOW (ref 36.0–46.0)
Hemoglobin: 7.7 g/dL — ABNORMAL LOW (ref 12.0–15.0)
Hemoglobin: 8.1 g/dL — ABNORMAL LOW (ref 12.0–15.0)
MCH: 25.7 pg — ABNORMAL LOW (ref 26.0–34.0)
MCH: 25.3 pg — ABNORMAL LOW (ref 26.0–34.0)
MCHC: 30.4 g/dL (ref 30.0–36.0)
MCHC: 30.1 g/dL (ref 30.0–36.0)
MCV: 84.3 fL (ref 80.0–100.0)
MCV: 84.1 fL (ref 80.0–100.0)
Platelets: 248 10*3/uL (ref 150–400)
Platelets: 249 10*3/uL (ref 150–400)
RBC: 3 MIL/uL — ABNORMAL LOW (ref 3.87–5.11)
RBC: 3.2 MIL/uL — ABNORMAL LOW (ref 3.87–5.11)
RDW: 14.3 % (ref 11.5–15.5)
RDW: 14.2 % (ref 11.5–15.5)
WBC: 14.6 10*3/uL — ABNORMAL HIGH (ref 4.0–10.5)
WBC: 12.9 10*3/uL — ABNORMAL HIGH (ref 4.0–10.5)
nRBC: 0 % (ref 0.0–0.2)
nRBC: 0 % (ref 0.0–0.2)

## 2020-09-13 SURGERY — LIGATION, FALLOPIAN TUBE, POSTPARTUM
Anesthesia: Epidural

## 2020-09-13 MED ORDER — CALCIUM CARBONATE ANTACID 500 MG PO CHEW
1.0000 | CHEWABLE_TABLET | Freq: Two times a day (BID) | ORAL | Status: DC | PRN
  Administered 2020-09-13: 200 mg via ORAL
  Filled 2020-09-13: qty 1

## 2020-09-13 MED ORDER — SUCCINYLCHOLINE CHLORIDE 200 MG/10ML IV SOSY
PREFILLED_SYRINGE | INTRAVENOUS | Status: AC
  Filled 2020-09-13: qty 10

## 2020-09-13 MED ORDER — FENTANYL CITRATE (PF) 100 MCG/2ML IJ SOLN
INTRAMUSCULAR | Status: AC
  Filled 2020-09-13: qty 2

## 2020-09-13 MED ORDER — MEPERIDINE HCL 25 MG/ML IJ SOLN: 6.2500 mg | INTRAMUSCULAR | Status: DC | PRN

## 2020-09-13 MED ORDER — HYDROMORPHONE HCL 1 MG/ML IJ SOLN
INTRAMUSCULAR | Status: AC
  Filled 2020-09-13: qty 0.5

## 2020-09-13 MED ORDER — GABAPENTIN 300 MG PO CAPS
300.0000 mg | ORAL_CAPSULE | Freq: Two times a day (BID) | ORAL | Status: DC
  Administered 2020-09-13 – 2020-09-14 (×2): 300 mg via ORAL
  Filled 2020-09-13 (×2): qty 1

## 2020-09-13 MED ORDER — FERROUS SULFATE 325 (65 FE) MG PO TABS
325.0000 mg | ORAL_TABLET | Freq: Every day | ORAL | Status: DC
  Administered 2020-09-13 – 2020-09-14 (×2): 325 mg via ORAL
  Filled 2020-09-13 (×2): qty 1

## 2020-09-13 MED ORDER — FENTANYL CITRATE (PF) 100 MCG/2ML IJ SOLN
INTRAMUSCULAR | Status: DC | PRN
Start: 2020-09-13 — End: 2020-09-13
  Administered 2020-09-13: 15 ug via INTRATHECAL

## 2020-09-13 MED ORDER — PROMETHAZINE HCL 25 MG/ML IJ SOLN: 6.2500 mg | INTRAMUSCULAR | Status: DC | PRN

## 2020-09-13 MED ORDER — KETOROLAC TROMETHAMINE 30 MG/ML IJ SOLN
INTRAMUSCULAR | Status: AC
  Filled 2020-09-13: qty 1

## 2020-09-13 MED ORDER — CHLOROPROCAINE HCL (PF) 3 % IJ SOLN
INTRAMUSCULAR | Status: AC
  Filled 2020-09-13: qty 20

## 2020-09-13 MED ORDER — IBUPROFEN 800 MG PO TABS
800.0000 mg | ORAL_TABLET | Freq: Three times a day (TID) | ORAL | Status: DC
  Administered 2020-09-13 – 2020-09-14 (×3): 800 mg via ORAL
  Filled 2020-09-13 (×3): qty 1

## 2020-09-13 MED ORDER — LACTATED RINGERS IV SOLN: INTRAVENOUS | Status: DC

## 2020-09-13 MED ORDER — FENTANYL CITRATE (PF) 100 MCG/2ML IJ SOLN
INTRAMUSCULAR | Status: DC | PRN
  Administered 2020-09-13: 85 ug via INTRAVENOUS

## 2020-09-13 MED ORDER — PROPOFOL 10 MG/ML IV BOLUS
INTRAVENOUS | Status: DC | PRN
  Administered 2020-09-13: 40 mg via INTRAVENOUS
  Administered 2020-09-13 (×2): 20 mg via INTRAVENOUS
  Administered 2020-09-13: 30 mg via INTRAVENOUS
  Administered 2020-09-13: 20 mg via INTRAVENOUS
  Administered 2020-09-13: 10 mg via INTRAVENOUS
  Administered 2020-09-13: 20 mg via INTRAVENOUS
  Administered 2020-09-13: 40 mg via INTRAVENOUS

## 2020-09-13 MED ORDER — CHLOROPROCAINE HCL (PF) 3 % IJ SOLN
INTRAMUSCULAR | Status: DC | PRN
  Administered 2020-09-13: 1.7 mL

## 2020-09-13 MED ORDER — ONDANSETRON HCL 4 MG/2ML IJ SOLN
INTRAMUSCULAR | Status: AC
  Filled 2020-09-13: qty 2

## 2020-09-13 MED ORDER — ONDANSETRON HCL 4 MG/2ML IJ SOLN
INTRAMUSCULAR | Status: DC | PRN
  Administered 2020-09-13: 4 mg via INTRAVENOUS

## 2020-09-13 MED ORDER — HYDROMORPHONE HCL 1 MG/ML IJ SOLN
0.2500 mg | INTRAMUSCULAR | Status: DC | PRN
  Administered 2020-09-13 (×2): 0.5 mg via INTRAVENOUS

## 2020-09-13 MED ORDER — OXYCODONE HCL 5 MG PO TABS: 5.0000 mg | ORAL_TABLET | ORAL | Status: DC | PRN

## 2020-09-13 MED ORDER — BUPIVACAINE HCL (PF) 0.5 % IJ SOLN
INTRAMUSCULAR | Status: AC
  Filled 2020-09-13: qty 30

## 2020-09-13 MED ORDER — MAGNESIUM HYDROXIDE 400 MG/5ML PO SUSP
30.0000 mL | Freq: Every day | ORAL | Status: DC | PRN
  Administered 2020-09-14: 30 mL via ORAL
  Filled 2020-09-13: qty 30

## 2020-09-13 MED ORDER — MIDAZOLAM HCL 2 MG/2ML IJ SOLN
INTRAMUSCULAR | Status: AC
  Filled 2020-09-13: qty 2

## 2020-09-13 MED ORDER — SODIUM CHLORIDE 0.9 % IV SOLN: 510.0000 mg | Freq: Once | INTRAVENOUS | Status: DC

## 2020-09-13 MED ORDER — ACETAMINOPHEN 325 MG PO TABS
650.0000 mg | ORAL_TABLET | Freq: Four times a day (QID) | ORAL | Status: DC
  Administered 2020-09-13 – 2020-09-14 (×3): 650 mg via ORAL
  Filled 2020-09-13 (×3): qty 2

## 2020-09-13 MED ORDER — OXYCODONE HCL 5 MG PO TABS
10.0000 mg | ORAL_TABLET | ORAL | Status: DC | PRN
  Administered 2020-09-13 – 2020-09-14 (×5): 10 mg via ORAL
  Filled 2020-09-13 (×5): qty 2

## 2020-09-13 MED ORDER — KETOROLAC TROMETHAMINE 30 MG/ML IJ SOLN
30.0000 mg | Freq: Once | INTRAMUSCULAR | Status: AC | PRN
  Administered 2020-09-13: 30 mg via INTRAVENOUS

## 2020-09-13 MED ORDER — LACTATED RINGERS IV SOLN: INTRAVENOUS | Status: DC | PRN

## 2020-09-13 MED ORDER — BUPIVACAINE HCL 0.5 % IJ SOLN
INTRAMUSCULAR | Status: DC | PRN
  Administered 2020-09-13: 20 mL

## 2020-09-13 MED ORDER — SODIUM CHLORIDE 0.9 % IR SOLN
Status: DC | PRN
  Administered 2020-09-13: 1

## 2020-09-13 MED ORDER — MIDAZOLAM HCL 2 MG/2ML IJ SOLN
INTRAMUSCULAR | Status: DC | PRN
  Administered 2020-09-13: 2 mg via INTRAVENOUS

## 2020-09-13 MED ORDER — POLYETHYLENE GLYCOL 3350 17 G PO PACK
17.0000 g | PACK | Freq: Every day | ORAL | Status: DC | PRN
  Administered 2020-09-13: 17 g via ORAL
  Filled 2020-09-13: qty 1

## 2020-09-13 MED ORDER — LIDOCAINE HCL (PF) 2 % IJ SOLN
INTRAMUSCULAR | Status: AC
  Filled 2020-09-13: qty 10

## 2020-09-13 SURGICAL SUPPLY — 32 items
APL SKNCLS STERI-STRIP NONHPOA (GAUZE/BANDAGES/DRESSINGS) ×1
BENZOIN TINCTURE PRP APPL 2/3 (GAUZE/BANDAGES/DRESSINGS) ×1 IMPLANT
CLOTH BEACON ORANGE TIMEOUT ST (SAFETY) ×2 IMPLANT
DRSG OPSITE POSTOP 3X4 (GAUZE/BANDAGES/DRESSINGS) ×2 IMPLANT
DURAPREP 26ML APPLICATOR (WOUND CARE) ×2 IMPLANT
ELECT REM PT RETURN 9FT ADLT (ELECTROSURGICAL) ×2
ELECTRODE REM PT RTRN 9FT ADLT (ELECTROSURGICAL) ×1 IMPLANT
GLOVE BIO SURGEON STRL SZ7 (GLOVE) ×2 IMPLANT
GLOVE BIOGEL PI IND STRL 7.0 (GLOVE) ×2 IMPLANT
GLOVE BIOGEL PI INDICATOR 7.0 (GLOVE) ×2
GOWN STRL REUS W/TWL LRG LVL3 (GOWN DISPOSABLE) ×4 IMPLANT
NEEDLE HYPO 22GX1.5 SAFETY (NEEDLE) ×2 IMPLANT
NS IRRIG 1000ML POUR BTL (IV SOLUTION) ×2 IMPLANT
PACK ABDOMINAL MINOR (CUSTOM PROCEDURE TRAY) ×2 IMPLANT
PENCIL BUTTON HOLSTER BLD 10FT (ELECTRODE) ×2 IMPLANT
PROTECTOR NERVE ULNAR (MISCELLANEOUS) ×2 IMPLANT
RETRACTOR WOUND ALXS 19CM XSML (INSTRUMENTS) ×1 IMPLANT
RTRCTR WOUND ALEXIS 19CM XSML (INSTRUMENTS) ×2
SET BERKELEY SUCTION TUBING (SUCTIONS) ×1 IMPLANT
SPONGE LAP 4X18 RFD (DISPOSABLE) IMPLANT
STRIP CLOSURE SKIN 1/2X4 (GAUZE/BANDAGES/DRESSINGS) ×1 IMPLANT
STRIP CLOSURE SKIN 1/4X3 (GAUZE/BANDAGES/DRESSINGS) ×2 IMPLANT
SUT MNCRL AB 4-0 PS2 18 (SUTURE) ×2 IMPLANT
SUT PLAIN 0 NONE (SUTURE) ×3 IMPLANT
SUT VIC AB 0 CT1 27 (SUTURE) ×4
SUT VIC AB 0 CT1 27XBRD ANBCTR (SUTURE) ×1 IMPLANT
SUT VICRYL 0 UR6 27IN ABS (SUTURE) ×2 IMPLANT
SYR CONTROL 10ML LL (SYRINGE) ×2 IMPLANT
TOWEL OR 17X24 6PK STRL BLUE (TOWEL DISPOSABLE) ×4 IMPLANT
TRAY FOLEY CATH SILVER 14FR (SET/KITS/TRAYS/PACK) ×2 IMPLANT
WATER STERILE IRR 1000ML POUR (IV SOLUTION) ×2 IMPLANT
YANKAUER SUCT BULB TIP NO VENT (SUCTIONS) ×1 IMPLANT

## 2020-09-13 NOTE — Progress Notes (Addendum)
POSTPARTUM PROGRESS NOTE  Post Partum Day 1  Subjective:  Krista Vaughn is a 34 y.o. [id number] s/p VD at [redacted]w[redacted]d.  She reports she is doing well. No acute events overnight. She denies any problems with ambulating, voiding or po intake. Denies nausea or vomiting.  Pain is well controlled.  Lochia is mild.  Objective: Blood pressure (!) 92/57, pulse 73, temperature 98.2 F (36.8 C), temperature source Oral, resp. rate 18, height 5\' 2"  (1.575 m), weight 72.5 kg, last menstrual period 12/15/2019, SpO2 100 %, unknown if currently breastfeeding.  Physical Exam:  General: alert, cooperative and no distress Chest: no respiratory distress Heart:regular rate Abdomen: soft, nontender Uterine Fundus: firm, appropriately tender DVT Evaluation: No calf swelling or tenderness Extremities: no edema Skin: warm, dry  Recent Labs    09/12/20 0813 09/13/20 0547  HGB 9.2* 7.7*  HCT 30.6* 25.3*    Assessment/Plan: Krista Vaughn is a 34 y.o. [id number] s/p VD at [redacted]w[redacted]d   PPD#1 - Doing well  Routine postpartum care  Acute blood loss anemia: Stable.  Hgb 7.7. Asymptomatic. Will plan to repeat CBC s/p OR for BTL this PM. --Start po ferrous sulfate --Will consider IV iron if down-trending repeat Hgb this PM.  Anxiety/depression: Stable.  --SW   Contraception: BTL scheduled for today, 9/14  Feeding: formula  Dispo: Plan for discharge tomorrow.   LOS: 1 day   Darrelyn Hillock, DO  Family Medicine PGY-3   09/13/2020, 7:22 AM  Attestation of Supervision of Student:  I confirm that I have verified the information documented in the  resident's  note and that I have also personally reperformed the history, physical exam and all medical decision making activities.  I have verified that all services and findings are accurately documented in this student's note; and I agree with management and plan as outlined in the documentation. I have also made any necessary editorial changes.  Randa Ngo,  [place] for [facility] [place] [facility] 09/13/2020 6:53 PM

## 2020-09-13 NOTE — Anesthesia Postprocedure Evaluation (Signed)
Anesthesia Post Note  Patient: Krista Vaughn  Procedure(s) Performed: POST PARTUM TUBAL LIGATION (N/A )     Patient location during evaluation: PACU Anesthesia Type: Spinal Level of consciousness: awake and alert and oriented Pain management: pain level controlled Vital Signs Assessment: post-procedure vital signs reviewed and stable Respiratory status: spontaneous breathing, nonlabored ventilation and respiratory function stable Cardiovascular status: blood pressure returned to baseline and stable Postop Assessment: no headache, no backache, spinal receding and patient able to bend at knees Anesthetic complications: no   No complications documented.  Last Vitals:  Vitals:   09/13/20 1412 09/13/20 1520  BP: 102/72 113/75  Pulse: 73 80  Resp: 20 20  Temp:  36.5 C  SpO2: 100% 100%    Last Pain:  Vitals:   09/13/20 1520  TempSrc: Oral  PainSc: 6    Pain Goal:                   Pervis Hocking

## 2020-09-13 NOTE — Anesthesia Postprocedure Evaluation (Signed)
Anesthesia Post Note  Patient: Krista Vaughn  Procedure(s) Performed: AN AD HOC LABOR EPIDURAL     Patient location during evaluation: Mother Baby Anesthesia Type: Epidural Level of consciousness: awake and alert Pain management: pain level controlled Vital Signs Assessment: post-procedure vital signs reviewed and stable Respiratory status: spontaneous breathing, nonlabored ventilation and respiratory function stable Cardiovascular status: stable Postop Assessment: no headache, no backache and epidural receding Anesthetic complications: no   No complications documented.  Last Vitals:  Vitals:   09/12/20 2228 09/13/20 0542  BP: (!) 106/59 (!) 92/57  Pulse: 83 73  Resp: 16 18  Temp:  36.8 C  SpO2: 100%     Last Pain:  Vitals:   09/13/20 0542  TempSrc: Oral  PainSc:    Pain Goal:                   Rayvon Char

## 2020-09-13 NOTE — Social Work (Signed)
MOB was referred for history of depression/anxiety.   * Referral screened out by Clinical Social Worker because none of the following criteria appear to apply:  ~ History of anxiety/depression during this pregnancy, or of post-partum depression following prior delivery. ~ Diagnosis of anxiety and/or depression within last 3 years OR * MOB's symptoms currently being treated with medication and/or therapy.  Please contact the Clinical Social Worker if needs arise, by [facility] request, or if MOB scores greater than 9/yes to question 10 on Edinburgh Postpartum Depression Screen.  Darra Lis, LCSWA [facility] [employer] [facility] [place] [phone]

## 2020-09-13 NOTE — Anesthesia Procedure Notes (Signed)
Spinal  Patient location during procedure: OR Start time: 09/13/2020 11:25 AM End time: 09/13/2020 11:28 AM Staffing Performed: anesthesiologist  Anesthesiologist: Pervis Hocking, DO Preanesthetic Checklist Completed: patient identified, IV checked, risks and benefits discussed, surgical consent, monitors and equipment checked, pre-op evaluation and timeout performed Spinal Block Patient position: sitting Prep: DuraPrep and site prepped and draped Patient monitoring: cardiac monitor, continuous pulse ox and blood pressure Approach: midline Location: L3-4 Injection technique: single-shot Needle Needle type: Pencan  Needle gauge: 24 G Needle length: 9 cm Assessment Sensory level: T6 Additional Notes Functioning IV was confirmed and monitors were applied. Sterile prep and drape, including hand hygiene and sterile gloves were used. The patient was positioned and the spine was prepped. The skin was anesthetized with lidocaine.  Free flow of clear CSF was obtained prior to injecting local anesthetic into the CSF.  The spinal needle aspirated freely following injection.  The needle was carefully withdrawn.  The patient tolerated the procedure well.

## 2020-09-13 NOTE — Op Note (Signed)
Procedure(s): POST PARTUM TUBAL LIGATION Procedure Note  Krista Vaughn female 34 y.o. 09/13/2020  Procedure(s) and Anesthesia Type:    * POST PARTUM TUBAL LIGATION - Choice  Surgeon(s) and Role:    * Cherre Blanc, MD - Primary    * Astrid Drafts Gildardo Cranker, MD - Fellow   Indications: The patient was admitted to the hospital with a SVD, she deisred permanent sterilization.     Surgeon: Cherre Blanc   Assistants: Guillermina City, MD  Anesthesia: General LMA anesthesia and Monitored Local Anesthesia with Sedation  ASA Class: 3  Procedure Detail: The patient was taken to the operating room where her epidural anesthesia was dosed up to surgical level and found to be adequate.  She was then placed in the dorsal supine position and prepped and draped in sterile fashion.  After an adequate timeout was performed, attention was turned to the patient's abdomen where a small transverse skin incision was made under the umbilical fold. The incision was taken down to the layer of fascia using the scalpel, and fascia was incised, and extended bilaterally using Mayo scissors. The peritoneum was entered in a sharp fashion. Attention was then turned to the patient's uterus, the left fallopian tube was grasped with Babcock clamps and followed out to the fimbriated end.  3-4 cm of the fallopian tube near isthmus portion was resected using Pomeroy method.  A similar process was carried out on the right side allowing for bilateral tubal sterilization. Bleeding was noted from the right pedicle.  The pedicle and mesosalpinx were doubly clamped using Kelly clamps and the distal portion of the tube removed using Metzenbaum scissors. The remaining pedicle was tied off using a 2-0 plain gut free tie and a subsequent suture ligation with 0 Vicryl transfixation stitch.  The pedicle was inspected and found to be hemostatic.    Good hemostasis was noted overall. The instruments were then removed from the patient's  abdomen and the fascial incision was repaired with 0 Vicryl, and the skin was closed with a 4-0 Vicryl subcuticular stitch. The patient tolerated the procedure well.  Instrument, sponge, and needle counts were correct times two.  The patient was then taken to the recovery room awake and in stable condition.

## 2020-09-13 NOTE — Transfer of Care (Signed)
Immediate Anesthesia Transfer of Care Note  Patient: Krista Vaughn  Procedure(s) Performed: POST PARTUM TUBAL LIGATION (N/A )  Patient Location: PACU  Anesthesia Type:Spinal  Level of Consciousness: awake, alert  and oriented  Airway & Oxygen Therapy: Patient Spontanous Breathing  Post-op Assessment: Report given to RN and Post -op Vital signs reviewed and stable  Post vital signs: Reviewed and stable  Last Vitals:  Vitals Value Taken Time  BP 92/61 09/13/20 1252  Temp    Pulse 79 09/13/20 1256  Resp 21 09/13/20 1256  SpO2 95 % 09/13/20 1256  Vitals shown include unvalidated device data.  Last Pain:  Vitals:   09/13/20 0840  TempSrc:   PainSc: 5          Complications: No complications documented.

## 2020-09-13 NOTE — Anesthesia Preprocedure Evaluation (Addendum)
Anesthesia Evaluation  Patient identified by MRN, date of birth, ID band Patient awake    Reviewed: Allergy & Precautions, NPO status , Patient's Chart, lab work & pertinent test results  Airway Mallampati: II  TM Distance: >3 FB Neck ROM: Full    Dental no notable dental hx.    Pulmonary former smoker,  Quit smoking 2015   Pulmonary exam normal breath sounds clear to auscultation       Cardiovascular negative cardio ROS Normal cardiovascular exam Rhythm:Regular Rate:Normal     Neuro/Psych  Headaches, PSYCHIATRIC DISORDERS Anxiety Depression    GI/Hepatic GERD  Medicated and Controlled,(+)     substance abuse  marijuana use,   Endo/Other  negative endocrine ROS  Renal/GU negative Renal ROS  negative genitourinary   Musculoskeletal negative musculoskeletal ROS (+)   Abdominal   Peds  Hematology  (+) Blood dyscrasia, anemia , H/H 7.7/25.3 from 9.2/30.6 pre-delivery, plt 248   Anesthesia Other Findings   Reproductive/Obstetrics J5T0 s/p vaginal delivery yesterday with epidural                              Anesthesia Physical Anesthesia Plan  ASA: II  Anesthesia Plan: Epidural   Post-op Pain Management:    Induction:   PONV Risk Score and Plan: 2 and Ondansetron and Treatment may vary due to age or medical condition  Airway Management Planned: Natural Airway  Additional Equipment: None  Intra-op Plan:   Post-operative Plan:   Informed Consent: I have reviewed the patients History and Physical, chart, labs and discussed the procedure including the risks, benefits and alternatives for the proposed anesthesia with the patient or authorized representative who has indicated his/her understanding and acceptance.       Plan Discussed with:   Anesthesia Plan Comments: (Allergy to decadron- "makes her skin crawl")       Anesthesia Quick Evaluation

## 2020-09-14 ENCOUNTER — Encounter: Payer: Medicaid Other | Admitting: Advanced Practice Midwife

## 2020-09-14 DIAGNOSIS — Z9851 Tubal ligation status: Secondary | ICD-10-CM

## 2020-09-14 LAB — SURGICAL PATHOLOGY

## 2020-09-14 MED ORDER — IBUPROFEN 800 MG PO TABS: 800.0000 mg | ORAL_TABLET | Freq: Three times a day (TID) | ORAL | 0 refills | Status: DC

## 2020-09-14 MED ORDER — MAGNESIUM CITRATE PO SOLN
1.0000 | Freq: Once | ORAL | Status: AC
  Administered 2020-09-14: 1 via ORAL
  Filled 2020-09-14: qty 296

## 2020-09-14 MED ORDER — OXYCODONE HCL 10 MG PO TABS: 10.0000 mg | ORAL_TABLET | ORAL | 0 refills | Status: DC | PRN

## 2020-09-15 ENCOUNTER — Encounter: Payer: Medicaid Other | Admitting: Certified Nurse Midwife

## 2020-09-19 ENCOUNTER — Telehealth: Payer: Self-pay

## 2020-09-19 NOTE — Telephone Encounter (Signed)
Received call from patient she reports having severe swelling and pain in her breast. She is not breast feeding at this time. She also reports some SOB that started a couple days ago. I have advised this patient to follow up with the ER for her SOB. She can apply ice and take Tyenol  for her breast pain but this needs to be address at the ER as well. Patient voice understanding at this time.

## 2020-09-20 ENCOUNTER — Observation Stay (HOSPITAL_COMMUNITY): Payer: Medicaid Other

## 2020-09-20 ENCOUNTER — Observation Stay (HOSPITAL_COMMUNITY)
Admission: AD | Admit: 2020-09-20 | Discharge: 2020-09-22 | Disposition: A | Discharge status: Home / Self Care | Payer: Medicaid Other | Attending: Family Medicine | Admitting: Family Medicine

## 2020-09-20 ENCOUNTER — Other Ambulatory Visit: Payer: Self-pay

## 2020-09-20 ENCOUNTER — Encounter (HOSPITAL_COMMUNITY): Payer: Self-pay | Admitting: Family Medicine

## 2020-09-20 DIAGNOSIS — O1415 Severe pre-eclampsia, complicating the puerperium: Secondary | ICD-10-CM | POA: Diagnosis present

## 2020-09-20 DIAGNOSIS — Z20822 Contact with and (suspected) exposure to covid-19: Secondary | ICD-10-CM | POA: Diagnosis not present

## 2020-09-20 DIAGNOSIS — Z79899 Other long term (current) drug therapy: Secondary | ICD-10-CM | POA: Insufficient documentation

## 2020-09-20 DIAGNOSIS — Z9889 Other specified postprocedural states: Secondary | ICD-10-CM

## 2020-09-20 DIAGNOSIS — R1011 Right upper quadrant pain: Secondary | ICD-10-CM | POA: Insufficient documentation

## 2020-09-20 DIAGNOSIS — Z3490 Encounter for supervision of normal pregnancy, unspecified, unspecified trimester: Secondary | ICD-10-CM

## 2020-09-20 DIAGNOSIS — R079 Chest pain, unspecified: Secondary | ICD-10-CM | POA: Diagnosis not present

## 2020-09-20 DIAGNOSIS — R0602 Shortness of breath: Secondary | ICD-10-CM | POA: Insufficient documentation

## 2020-09-20 DIAGNOSIS — O99893 Other specified diseases and conditions complicating puerperium: Secondary | ICD-10-CM

## 2020-09-20 DIAGNOSIS — O152 Eclampsia in the puerperium: Secondary | ICD-10-CM | POA: Diagnosis not present

## 2020-09-20 LAB — COMPREHENSIVE METABOLIC PANEL
ALT: 36 U/L (ref 0–44)
AST: 33 U/L (ref 15–41)
Albumin: 3 g/dL — ABNORMAL LOW (ref 3.5–5.0)
Alkaline Phosphatase: 103 U/L (ref 38–126)
Anion gap: 11 (ref 5–15)
BUN: 9 mg/dL (ref 6–20)
CO2: 22 mmol/L (ref 22–32)
Calcium: 8.9 mg/dL (ref 8.9–10.3)
Chloride: 107 mmol/L (ref 98–111)
Creatinine, Ser: 0.75 mg/dL (ref 0.44–1.00)
GFR calc Af Amer: >60 mL/min (ref >60)
GFR calc non Af Amer: >60 mL/min (ref >60)
Glucose, Bld: 89 mg/dL (ref 70–99)
Potassium: 3.9 mmol/L (ref 3.5–5.1)
Sodium: 140 mmol/L (ref 135–145)
Total Bilirubin: 0.5 mg/dL (ref 0.3–1.2)
Total Protein: 6.4 g/dL — ABNORMAL LOW (ref 6.5–8.1)

## 2020-09-20 LAB — URINALYSIS, ROUTINE W REFLEX MICROSCOPIC
Bacteria, UA: NONE SEEN
Bilirubin Urine: NEGATIVE
Glucose, UA: NEGATIVE mg/dL
Ketones, ur: NEGATIVE mg/dL
Nitrite: NEGATIVE
Protein, ur: NEGATIVE mg/dL
Specific Gravity, Urine: 1.011 (ref 1.005–1.030)
pH: 6 (ref 5.0–8.0)

## 2020-09-20 LAB — SARS CORONAVIRUS 2 BY RT PCR (HOSPITAL ORDER, PERFORMED IN ~~LOC~~ HOSPITAL LAB): SARS Coronavirus 2: NEGATIVE

## 2020-09-20 LAB — CBC
HCT: 31.8 % — ABNORMAL LOW (ref 36.0–46.0)
Hemoglobin: 9.4 g/dL — ABNORMAL LOW (ref 12.0–15.0)
MCH: 24.7 pg — ABNORMAL LOW (ref 26.0–34.0)
MCHC: 29.6 g/dL — ABNORMAL LOW (ref 30.0–36.0)
MCV: 83.7 fL (ref 80.0–100.0)
Platelets: 332 10*3/uL (ref 150–400)
RBC: 3.8 MIL/uL — ABNORMAL LOW (ref 3.87–5.11)
RDW: 14.8 % (ref 11.5–15.5)
WBC: 14.1 10*3/uL — ABNORMAL HIGH (ref 4.0–10.5)
nRBC: 0 % (ref 0.0–0.2)

## 2020-09-20 LAB — BRAIN NATRIURETIC PEPTIDE: B Natriuretic Peptide: 369.1 pg/mL — ABNORMAL HIGH (ref 0.0–100.0)

## 2020-09-20 LAB — PROTEIN / CREATININE RATIO, URINE
Creatinine, Urine: 46.89 mg/dL
Total Protein, Urine: <6 mg/dL

## 2020-09-20 LAB — TROPONIN I (HIGH SENSITIVITY): Troponin I (High Sensitivity): 6 ng/L (ref <18)

## 2020-09-20 MED ORDER — LABETALOL HCL 5 MG/ML IV SOLN: 40.0000 mg | INTRAVENOUS | Status: DC | PRN

## 2020-09-20 MED ORDER — PRENATAL MULTIVITAMIN CH
1.0000 | ORAL_TABLET | Freq: Every day | ORAL | Status: DC
  Administered 2020-09-22: 1 via ORAL
  Filled 2020-09-20 (×2): qty 1

## 2020-09-20 MED ORDER — LABETALOL HCL 5 MG/ML IV SOLN
20.0000 mg | INTRAVENOUS | Status: DC | PRN
  Filled 2020-09-20: qty 4

## 2020-09-20 MED ORDER — IOHEXOL 350 MG/ML SOLN
100.0000 mL | Freq: Once | INTRAVENOUS | Status: AC | PRN
  Administered 2020-09-20: 100 mL via INTRAVENOUS

## 2020-09-20 MED ORDER — MAGNESIUM SULFATE 40 GM/1000ML IV SOLN
2.0000 g/h | INTRAVENOUS | Status: AC
  Administered 2020-09-21 (×2): 2 g/h via INTRAVENOUS
  Filled 2020-09-20 (×2): qty 1000

## 2020-09-20 MED ORDER — MAGNESIUM SULFATE BOLUS VIA INFUSION
4.0000 g | Freq: Once | INTRAVENOUS | Status: AC
  Administered 2020-09-21: 4 g via INTRAVENOUS
  Filled 2020-09-20: qty 1000

## 2020-09-20 MED ORDER — OXYCODONE-ACETAMINOPHEN 5-325 MG PO TABS
1.0000 | ORAL_TABLET | ORAL | Status: DC | PRN
  Administered 2020-09-20 – 2020-09-22 (×7): 1 via ORAL
  Filled 2020-09-20 (×7): qty 1

## 2020-09-20 MED ORDER — SODIUM CHLORIDE 0.9 % IV SOLN: 250.0000 mL | INTRAVENOUS | Status: DC | PRN

## 2020-09-20 MED ORDER — LABETALOL HCL 5 MG/ML IV SOLN: 80.0000 mg | INTRAVENOUS | Status: DC | PRN

## 2020-09-20 MED ORDER — HYDRALAZINE HCL 20 MG/ML IJ SOLN: 10.0000 mg | INTRAMUSCULAR | Status: DC | PRN

## 2020-09-20 MED ORDER — SODIUM CHLORIDE 0.9% FLUSH: 3.0000 mL | INTRAVENOUS | Status: DC | PRN

## 2020-09-20 MED ORDER — SODIUM CHLORIDE 0.9% FLUSH
3.0000 mL | Freq: Two times a day (BID) | INTRAVENOUS | Status: DC
  Administered 2020-09-21 – 2020-09-22 (×2): 3 mL via INTRAVENOUS

## 2020-09-20 NOTE — H&P (Addendum)
Krista Vaughn is an 34 y.o. female. Presents with c/o sharp chest pain which is worse with deep inspiration.  States feels a little short of breath.  SVD occurred 09/12/20 and states her pregnancy was uncomplicated.  No history of elevated blood pressures prior to today.  States her feet and ankles have been swollen the past few days.  States has had headaches for several days but does not have one today.  Denies visual changes.  Does have some pain in RUQ with palpation.    RN Note: PT SAYS SHE DEL VAG ON 9-13- AND HAD BTL.   WENT HOME WED 9-15.    PT SAYS  HAS TROUBLE BREATHING SINCE LEFT HOSPITAL  - O2 SAT IS 100-  NOT BREAST FEEDING     Past Medical History:  Diagnosis Date  . Anxiety   . Bronchitis   . Chlamydia   . Cholecystitis   . Constipation   . Depression   . GERD (gastroesophageal reflux disease)   . H. pylori infection   . Migraine   . Uterine fibroid     Past Surgical History:  Procedure Laterality Date  . CHOLECYSTECTOMY N/A 04/26/2020   Procedure: LAPAROSCOPIC CHOLECYSTECTOMY;  Surgeon: Kinsinger, De Blanch, MD;  Location: [facility] OR;  Service: General;  Laterality: N/A;  . INDUCED ABORTION    . NO PAST SURGERIES    . THERAPEUTIC ABORTION    . TUBAL LIGATION N/A 09/13/2020   Procedure: POST PARTUM TUBAL LIGATION;  Surgeon: Malachy Chamber, MD;  Location: [facility] LD ORS;  Service: Gynecology;  Laterality: N/A;    Family History  Problem Relation Age of Onset  . Down syndrome Sister     Social History:  reports that she quit smoking about 6 years ago. Her smoking use included cigarettes. She quit after 4.00 years of use. She has never used smokeless tobacco. She reports previous alcohol use. She reports that she does not use drugs.  Allergies:  Allergies  Allergen Reactions  . Ambien [Zolpidem] Other (See Comments)    Pt reports intolerance due to dizziness; prefers not to take.   . Decadron [Dexamethasone] Other (See Comments)    Makes her skin crawl     Medications Prior to Admission  Medication Sig Dispense Refill Last Dose  . acetaminophen (TYLENOL) 500 MG tablet Take 500-1,000 mg by mouth every 6 (six) hours as needed (pain.).    09/19/2020 at Unknown time  . ferrous sulfate 325 (65 FE) MG tablet Take 1 tablet (325 mg total) by mouth 2 (two) times daily with a meal. 60 tablet 5 Past Week at Unknown time  . hydrOXYzine (VISTARIL) 25 MG capsule Take 1 capsule (25 mg total) by mouth at bedtime as needed for anxiety. 30 capsule 1 Past Week at Unknown time  . ibuprofen (ADVIL) 800 MG tablet Take 1 tablet (800 mg total) by mouth every 8 (eight) hours. 30 tablet 0 09/19/2020 at Unknown time  . omeprazole (PRILOSEC) 20 MG capsule Take 1 capsule (20 mg total) by mouth 2 (two) times daily before a meal. 60 capsule 5 09/19/2020 at Unknown time  . oxyCODONE 10 MG TABS Take 1 tablet (10 mg total) by mouth every 4 (four) hours as needed for severe pain or breakthrough pain. 10 tablet 0 09/19/2020 at Unknown time  . Blood Pressure Monitor KIT 1 Device by Does not apply route once a week. To be monitored Regularly at home. 1 kit 0   . diphenhydrAMINE (BENADRYL) 25 MG tablet  Take 25 mg by mouth every 6 (six) hours as needed (allergies.). (Patient not taking: Reported on 08/24/2020)     . docusate sodium (COLACE) 100 MG capsule Take 1 capsule (100 mg total) by mouth daily. 30 capsule 0   . Elastic Bandages & Supports (COMFORT FIT MATERNITY SUPP SM) MISC Wear as directed. 1 each 0     Review of Systems  Constitutional: Negative for chills and fever.  HENT: Negative for congestion and sinus pain.   Eyes: Negative for photophobia and visual disturbance.  Respiratory: Positive for chest tightness and shortness of breath. Negative for cough and wheezing.        Substernal chest pain worse with deep inspiration   Cardiovascular: Positive for chest pain and leg swelling. Negative for palpitations.  Gastrointestinal: Positive for abdominal pain. Negative for  constipation, diarrhea, nausea and vomiting.  Genitourinary: Positive for vaginal bleeding. Negative for dysuria and pelvic pain.  Neurological: Positive for headaches. Negative for dizziness, weakness and light-headedness.    Blood pressure (!) 170/98, pulse 72, temperature 98.6 F (37 C), resp. rate 17, height $RemoveBe'5\' 1"'lijGhkOYm$  (1.549 m), weight 66.4 kg, SpO2 100 %, unknown if currently breastfeeding. Physical Exam Constitutional:      General: She is not in acute distress.    Appearance: She is well-developed. She is not ill-appearing or toxic-appearing.  HENT:     Head: Normocephalic.  Cardiovascular:     Rate and Rhythm: Normal rate and regular rhythm.     Heart sounds: Normal heart sounds. No murmur heard.  No gallop.   Pulmonary:     Effort: Pulmonary effort is normal. No respiratory distress.     Breath sounds: No wheezing, rhonchi or rales.  Chest:     Chest wall: Tenderness present.  Abdominal:     General: A surgical scar is present. Bowel sounds are normal. There is no distension.     Tenderness: There is abdominal tenderness in the right upper quadrant.  Genitourinary:    Uterus: Normal.   Skin:    General: Skin is warm and dry.  Neurological:     General: No focal deficit present.     Mental Status: She is alert and oriented to person, place, and time.     Motor: No weakness.     Comments: DTRs 3+, no clonus   Psychiatric:        Mood and Affect: Mood normal.        Behavior: Behavior normal.    LABS: Results for orders placed or performed during the hospital encounter of 09/20/20 (from the past 24 hour(s))  Urinalysis, Routine w reflex microscopic Urine, Clean Catch     Status: Abnormal   Collection Time: 09/20/20  9:12 PM  Result Value Ref Range   Color, Urine STRAW (A) YELLOW   APPearance CLEAR CLEAR   Specific Gravity, Urine 1.011 1.005 - 1.030   pH 6.0 5.0 - 8.0   Glucose, UA NEGATIVE NEGATIVE mg/dL   Hgb urine dipstick MODERATE (A) NEGATIVE   Bilirubin Urine  NEGATIVE NEGATIVE   Ketones, ur NEGATIVE NEGATIVE mg/dL   Protein, ur NEGATIVE NEGATIVE mg/dL   Nitrite NEGATIVE NEGATIVE   Leukocytes,Ua TRACE (A) NEGATIVE   RBC / HPF 0-5 0 - 5 RBC/hpf   WBC, UA 0-5 0 - 5 WBC/hpf   Bacteria, UA NONE SEEN NONE SEEN   Squamous Epithelial / LPF 0-5 0 - 5  CBC     Status: Abnormal   Collection Time: 09/20/20  9:15  PM  Result Value Ref Range   WBC 14.1 (H) 4.0 - 10.5 K/uL   RBC 3.80 (L) 3.87 - 5.11 MIL/uL   Hemoglobin 9.4 (L) 12.0 - 15.0 g/dL   HCT 31.8 (L) 36 - 46 %   MCV 83.7 80.0 - 100.0 fL   MCH 24.7 (L) 26.0 - 34.0 pg   MCHC 29.6 (L) 30.0 - 36.0 g/dL   RDW 14.8 11.5 - 15.5 %   Platelets 332 150 - 400 K/uL   nRBC 0.0 0.0 - 0.2 %  Comprehensive metabolic panel     Status: Abnormal   Collection Time: 09/20/20  9:15 PM  Result Value Ref Range   Sodium 140 135 - 145 mmol/L   Potassium 3.9 3.5 - 5.1 mmol/L   Chloride 107 98 - 111 mmol/L   CO2 22 22 - 32 mmol/L   Glucose, Bld 89 70 - 99 mg/dL   BUN 9 6 - 20 mg/dL   Creatinine, Ser 0.75 0.44 - 1.00 mg/dL   Calcium 8.9 8.9 - 10.3 mg/dL   Total Protein 6.4 (L) 6.5 - 8.1 g/dL   Albumin 3.0 (L) 3.5 - 5.0 g/dL   AST 33 15 - 41 U/L   ALT 36 0 - 44 U/L   Alkaline Phosphatase 103 38 - 126 U/L   Total Bilirubin 0.5 0.3 - 1.2 mg/dL   GFR calc non Af Amer >60 >60 mL/min   GFR calc Af Amer >60 >60 mL/min   Anion gap 11 5 - 15  Protein / creatinine ratio, urine     Status: None   Collection Time: 09/20/20  9:15 PM  Result Value Ref Range   Creatinine, Urine 46.89 mg/dL   Total Protein, Urine <6 mg/dL   Protein Creatinine Ratio        0.00 - 0.15 mg/mg[Cre]  Troponin I (High Sensitivity)     Status: None   Collection Time: 09/20/20  9:15 PM  Result Value Ref Range   Troponin I (High Sensitivity) 6 <18 ng/L  Brain natriuretic peptide     Status: Abnormal   Collection Time: 09/20/20  9:16 PM  Result Value Ref Range   B Natriuretic Peptide 369.1 (H) 0.0 - 100.0 pg/mL  SARS Coronavirus 2 by RT PCR  (hospital order, performed in [facility] hospital lab) Nasopharyngeal Urine, Catheterized     Status: None   Collection Time: 09/20/20 10:15 PM   Specimen: Urine, Catheterized; Nasopharyngeal  Result Value Ref Range   SARS Coronavirus 2 NEGATIVE NEGATIVE    RADIOLOGY: CT Angiogram ordered and is neg CT ANGIO CHEST PE W OR WO CONTRAST  Result Date: 09/21/2020 CLINICAL DATA:  Chest pain shortness of breath for 3 days, history of recent pregnancy EXAM: CT ANGIOGRAPHY CHEST WITH CONTRAST TECHNIQUE: Multidetector CT imaging of the chest was performed using the standard protocol during bolus administration of intravenous contrast. Multiplanar CT image reconstructions and MIPs were obtained to evaluate the vascular anatomy. CONTRAST:  144mL OMNIPAQUE IOHEXOL 350 MG/ML SOLN COMPARISON:  None. FINDINGS: Cardiovascular: Thoracic aorta and its branches are within normal limits. No cardiac enlargement is seen. No coronary calcifications are noted. The pulmonary artery is well visualized within normal branching pattern. No filling defects to suggest pulmonary emboli are noted. Mediastinum/Nodes: Thoracic inlet is within normal limits. No hilar or mediastinal adenopathy is noted. The esophagus as visualized is within normal limits. Lungs/Pleura: Lungs are well aerated bilaterally. No focal infiltrate or sizable effusion is seen. No pneumothorax is seen. No parenchymal  nodules are noted. Upper Abdomen: No acute abnormality. Musculoskeletal: No chest wall abnormality. No acute or significant osseous findings. Breasts are in [place] consistent with the postpartum state. Review of the MIP images confirms the above findings. IMPRESSION: No evidence of pulmonary emboli. No acute abnormality noted. Electronically Signed   By: Inez Catalina M.D.   On: 09/21/2020 00:23     Assessment/Plan: Postpartum Day #8, PostOp Day#7 Chest pain Severe range hypertension Postpartum preeclampsia with severe features  Admit to [facility]  specialty care unit Routine orders Magnesium Sulfate infusion Preeclampsia focused order set MD to follow   Hansel Feinstein 09/20/2020, 9:42 PM

## 2020-09-20 NOTE — MAU Note (Signed)
Pt reports she has been experiencing SOB since she was discharged home on 09/14/2020. Pt states that her ankles and feet are swollen as well. Pt reports that she has been researching and she feels as if she may die. Pt states that she thinks she has a blood clot in her throat as well as a pulmonary embolism. Pt states she feels she has to take short breaths because when she breathes in deep, she experiences sharp pains that radiate to the right of her chest.

## 2020-09-20 NOTE — MAU Note (Signed)
PT SAYS SHE DEL VAG ON 9-13- AND HAD BTL.   WENT HOME WED 9-15.    PT SAYS  [place]  - O2 SAT IS 100-  NOT BREAST FEEDING

## 2020-09-21 DIAGNOSIS — O1495 Unspecified pre-eclampsia, complicating the puerperium: Secondary | ICD-10-CM | POA: Diagnosis not present

## 2020-09-21 DIAGNOSIS — O99893 Other specified diseases and conditions complicating puerperium: Secondary | ICD-10-CM

## 2020-09-21 DIAGNOSIS — R079 Chest pain, unspecified: Secondary | ICD-10-CM

## 2020-09-21 MED ORDER — LACTATED RINGERS IV SOLN: INTRAVENOUS | Status: DC

## 2020-09-21 NOTE — Progress Notes (Signed)
Faculty Attending Note  Post Partum Day 8  Subjective: Patient is feeling better. She reports headache has improved. Still with chest pain with deep inspiration. She is ambulating and denies light-headedness or dizziness. She is passing flatus. She is tolerating a regular diet without nausea/vomiting. Bleeding is minimal.   Objective: Blood pressure 124/76, pulse 71, temperature 98.1 F (36.7 C), temperature source Oral, resp. rate 16, height 5\' 1"  (1.549 m), weight 66.4 kg, SpO2 98 %, unknown if currently breastfeeding. Temp:  [98.1 F (36.7 C)-98.6 F (37 C)] 98.1 F (36.7 C) (09/22 0800) Pulse Rate:  [63-87] 71 (09/22 0800) Resp:  [14-20] 16 (09/22 0800) BP: (109-170)/(60-98) 124/76 (09/22 0800) SpO2:  [98 %-100 %] 98 % (09/22 0800) Weight:  [66.4 kg] 66.4 kg (09/21 1943)  Physical Exam:  General: alert, oriented, cooperative Chest: normal respiratory effort Heart: RRR  Abdomen: soft, appropriately tender to palpation  Uterine Fundus: firm, below the umbilicus Lochia: minimal, rubra DVT Evaluation: no evidence of DVT Extremities: no edema, no calf tenderness  UOP: voiding spontaneously  Recent Labs    09/20/20 2115  HGB 9.4*  HCT 31.8*    Assessment/Plan: Patient Active Problem List   Diagnosis Date Noted  . Hypertension in pregnancy, preeclampsia, severe, postpartum condition 09/20/2020  . History of bilateral tubal ligation 09/14/2020  . Normal labor 09/12/2020  . Uterine fibroids affecting pregnancy in third trimester 06/06/2020  . Gallstones 03/21/2020  . Supervision of normal pregnancy, antepartum 02/23/2020  . Anxiety during pregnancy in third trimester, antepartum 12/10/2013  . Depression complicating pregnancy in third trimester, antepartum 12/10/2013  . Marijuana use 11/22/2013    Patient is 34 y.o. [id number] PPD#8 s/p SVD, re-admit overnight for post partum pre-eclampsia and magnesium prophylaxis. Mag until 0030 09/22/20. She had headache on arrival, is  feeling better. Still with chest pain, however CTA negative, EKG normal and had BTL, likely 2/2 air irritating diaphram. Reviewed course, plan for 24 hrs magnesium, starting anti-hypertensives as needed. She verbalized understanding of the above and is agreeable to plan.  Mag x 24 hrs Anti-hypertensives as needed Routine diet Hopefully home tomorrow Pain meds prn   Feliz Beam, M.D. Attending [facility] [employer] (Faculty Practice)  09/21/2020, 11:32 AM

## 2020-09-22 DIAGNOSIS — O1415 Severe pre-eclampsia, complicating the puerperium: Secondary | ICD-10-CM

## 2020-09-22 MED ORDER — AMLODIPINE BESYLATE 5 MG PO TABS: 5.0000 mg | ORAL_TABLET | Freq: Every day | ORAL | 1 refills | Status: AC

## 2020-09-22 MED ORDER — SIMETHICONE 80 MG PO CHEW: 80.0000 mg | CHEWABLE_TABLET | Freq: Four times a day (QID) | ORAL | Status: DC | PRN

## 2020-09-22 MED ORDER — AMLODIPINE BESYLATE 5 MG PO TABS
5.0000 mg | ORAL_TABLET | Freq: Every day | ORAL | Status: DC
  Administered 2020-09-22: 5 mg via ORAL
  Filled 2020-09-22: qty 1

## 2020-09-22 MED FILL — AMLODIPINE BESYLATE 5 MG TA: 5 | 30 days supply | Qty: 30 | Fill #0

## 2020-09-22 NOTE — Discharge Instructions (Signed)
Preeclampsia and Eclampsia Preeclampsia is a serious condition that may develop during pregnancy. This condition causes high blood pressure and increased protein in your urine along with other symptoms, such as headaches and vision changes. These symptoms may develop as the condition gets worse. Preeclampsia may occur at 20 weeks of pregnancy or later. Diagnosing and treating preeclampsia early is very important. If not treated early, it can cause serious problems for you and your baby. One problem it can lead to is eclampsia. Eclampsia is a condition that causes muscle jerking or shaking (convulsions or seizures) and other serious problems for the mother. During pregnancy, delivering your baby may be the best treatment for preeclampsia or eclampsia. For most women, preeclampsia and eclampsia symptoms go away after giving birth. In rare cases, a woman may develop preeclampsia after giving birth (postpartum preeclampsia). This usually occurs within 48 hours after childbirth but may occur up to 6 weeks after giving birth. What are the causes? The cause of preeclampsia is not known. What increases the risk? The following risk factors make you more likely to develop preeclampsia:  Being pregnant for the first time.  Having had preeclampsia during a past pregnancy.  Having a family history of preeclampsia.  Having high blood pressure.  Being pregnant with more than one baby.  Being 35 or older.  Being African-American.  Having kidney disease or diabetes.  Having medical conditions such as lupus or blood diseases.  Being very overweight (obese). What are the signs or symptoms? The most common symptoms are:  Severe headaches.  Vision problems, such as blurred or double vision.  Abdominal pain, especially upper abdominal pain. Other symptoms that may develop as the condition gets worse include:  Sudden weight gain.  Sudden swelling of the hands, face, legs, and feet.  Severe nausea  and vomiting.  Numbness in the face, arms, legs, and feet.  Dizziness.  Urinating less than usual.  Slurred speech.  Convulsions or seizures. How is this diagnosed? There are no screening tests for preeclampsia. Your health care provider will ask you about symptoms and check for signs of preeclampsia during your prenatal visits. You may also have tests that include:  Checking your blood pressure.  Urine tests to check for protein. Your health care provider will check for this at every prenatal visit.  Blood tests.  Monitoring your baby's heart rate.  Ultrasound. How is this treated? You and your health care provider will determine the treatment approach that is best for you. Treatment may include:  Having more frequent prenatal exams to check for signs of preeclampsia, if you have an increased risk for preeclampsia.  Medicine to lower your blood pressure.  Staying in the hospital, if your condition is severe. There, treatment will focus on controlling your blood pressure and the amount of fluids in your body (fluid retention).  Taking medicine (magnesium sulfate) to prevent seizures. This may be given as an injection or through an IV.  Taking a low-dose aspirin during your pregnancy.  Delivering your baby early. You may have your labor started with medicine (induced), or you may have a cesarean delivery. Follow these instructions at home: Eating and drinking   Drink enough fluid to keep your urine pale yellow.  Avoid caffeine. Lifestyle  Do not use any products that contain nicotine or tobacco, such as cigarettes and e-cigarettes. If you need help quitting, ask your health care provider.  Do not use alcohol or drugs.  Avoid stress as much as possible. Rest and get   plenty of sleep. General instructions  Take over-the-counter and prescription medicines only as told by your health care provider.  When lying down, lie on your left side. This keeps pressure off your  major blood vessels.  When sitting or lying down, raise (elevate) your feet. Try putting some pillows underneath your lower legs.  Exercise regularly. Ask your health care provider what kinds of exercise are best for you.  Keep all follow-up and prenatal visits as told by your health care provider. This is important. How is this prevented? There is no known way of preventing preeclampsia or eclampsia from developing. However, to lower your risk of complications and detect problems early:  Get regular prenatal care. Your health care provider may be able to diagnose and treat the condition early.  Maintain a healthy weight. Ask your health care provider for help managing weight gain during pregnancy.  Work with your health care provider to manage any long-term (chronic) health conditions you have, such as diabetes or kidney problems.  You may have tests of your blood pressure and kidney function after giving birth.  Your health care provider may have you take low-dose aspirin during your next pregnancy. Contact a health care provider if:  You have symptoms that your health care provider told you may require more treatment or monitoring, such as: ? Headaches. ? Nausea or vomiting. ? Abdominal pain. ? Dizziness. ? Light-headedness. Get help right away if:  You have severe: ? Abdominal pain. ? Headaches that do not get better. ? Dizziness. ? Vision problems. ? Confusion. ? Nausea or vomiting.  You have any of the following: ? A seizure. ? Sudden, rapid weight gain. ? Sudden swelling in your hands, ankles, or face. ? Trouble moving any part of your body. ? Numbness in any part of your body. ? Trouble speaking. ? Abnormal bleeding.  You faint. Summary  Preeclampsia is a serious condition that may develop during pregnancy.  This condition causes high blood pressure and increased protein in your urine along with other symptoms, such as headaches and vision  changes.  Diagnosing and treating preeclampsia early is very important. If not treated early, it can cause serious problems for you and your baby.  Get help right away if you have symptoms that your health care provider told you to watch for. This information is not intended to replace advice given to you by your health care provider. Make sure you discuss any questions you have with your health care provider. Document Revised: 08/19/2018 Document Reviewed: 07/23/2016 Elsevier Patient Education  2020 Elsevier Inc.  

## 2020-09-22 NOTE — Discharge Summary (Signed)
Physician Discharge Summary  Patient ID: Krista Vaughn MRN: [id number] DOB/AGE: [birth year] 33 y.o.  Admit date: 09/20/2020 Discharge date: 09/22/2020  Admission Diagnoses: post partum pre-eclampsia  Discharge Diagnoses:  Active Problems:   Supervision of normal pregnancy, antepartum   Hypertension in pregnancy, preeclampsia, severe, postpartum condition   Discharged Condition: good  Hospital Course: Please see HPI dated 09/20/2020 for full details. Briefly, this is a 34 y.o. [id number] female admitted for post partum pre-eclampsia 2/2 headache and hypertension. Had 24 hrs magnesium therapy and headache improved quickly. BP mildly elevated s/p mag and she was started on norvasc 5 mg, discharged home in good condition. Instructions for short interval follow up and reasons to return to hospital.    Physical Exam:  BP 133/70 (BP Location: Left Arm)   Pulse 67   Temp 98.8 F (37.1 C) (Oral)   Resp 18   Ht _0  (1.549 m)   Wt 66.4 kg   SpO2 99%   BMI 27.64 kg/m   Labs: Lab Results  Component Value Date   WBC 14.1 (H) 09/20/2020   HGB 9.4 (L) 09/20/2020   HCT 31.8 (L) 09/20/2020   MCV 83.7 09/20/2020   PLT 332 09/20/2020   CMP Latest Ref Rng & Units 09/20/2020  Glucose 70 - 99 mg/dL 89  BUN 6 - 20 mg/dL 9  Creatinine 0.44 - 1.00 mg/dL 0.75  Sodium 135 - 145 mmol/L 140  Potassium 3.5 - 5.1 mmol/L 3.9  Chloride 98 - 111 mmol/L 107  CO2 22 - 32 mmol/L 22  Calcium 8.9 - 10.3 mg/dL 8.9  Total Protein 6.5 - 8.1 g/dL 6.4(L)  Total Bilirubin 0.3 - 1.2 mg/dL 0.5  Alkaline Phos 38 - 126 U/L 103  AST 15 - 41 U/L 33  ALT 0 - 44 U/L 36    Disposition:  There are no questions and answers to display.         An After Visit Summary was printed and given to the patient. Allergies as of 09/22/2020       Reactions   Ambien [zolpidem] Other (See Comments)   Pt reports intolerance due to dizziness; prefers not to take.    Decadron [dexamethasone] Other (See Comments)    Makes her skin crawl        Medication List     STOP taking these medications    docusate sodium 100 MG capsule Commonly known as: Colace   ferrous sulfate 325 (65 FE) MG tablet   omeprazole 20 MG capsule Commonly known as: PriLOSEC   Oxycodone HCl 10 MG Tabs       TAKE these medications    amLODipine 5 MG tablet Commonly known as: NORVASC Take 1 tablet (5 mg total) by mouth daily.   Blood Pressure Monitor Kit 1 Device by Does not apply route once a week. To be monitored Regularly at home.   [place] Supp Sm Misc Wear as directed.   hydrOXYzine 25 MG capsule Commonly known as: Vistaril Take 1 capsule (25 mg total) by mouth at bedtime as needed for anxiety.   ibuprofen 800 MG tablet Commonly known as: ADVIL Take 1 tablet (800 mg total) by mouth every 8 (eight) hours.   PRENATAL PO Take 1 tablet by mouth daily.        Follow-up Information     [facility] Follow up.   Specialty: Obstetrics and Gynecology Contact information: [address], [place]  [address] [phone]                Signed: Sloan Leiter 09/22/2020, 9:51 AM

## 2020-09-22 NOTE — Progress Notes (Signed)
    Gynecology Progress Note  Admission Date: 09/20/2020 Current Date: 09/22/2020 9:12 AM  Krista Vaughn is a 34 y.o. [id number] HD#3 admitted for post partum pre-eclampsia   History complicated by: Patient Active Problem List   Diagnosis Date Noted  . Hypertension in pregnancy, preeclampsia, severe, postpartum condition 09/20/2020  . History of bilateral tubal ligation 09/14/2020  . Normal labor 09/12/2020  . Uterine fibroids affecting pregnancy in third trimester 06/06/2020  . Gallstones 03/21/2020  . Supervision of normal pregnancy, antepartum 02/23/2020  . Anxiety during pregnancy in third trimester, antepartum 12/10/2013  . Depression complicating pregnancy in third trimester, antepartum 12/10/2013  . Marijuana use 11/22/2013    Subjective:  Pt feeling much better. Headache resolved. Leg swelling is gone, chest pain has resolved. Feels "lighter" and just much more herself. Pain well controlled, had improved back pain with passing flatus, requesting gas meds. Eager for discharge.  Objective:   Vitals:   09/21/20 2323 09/22/20 0329 09/22/20 0330 09/22/20 0746  BP: 129/82 121/71  133/70  Pulse: 70 73  67  Resp: 18 18  18   Temp: 53.6 F (64.4 C) 97.8 F (36.6 C)  98.8 F (37.1 C)  TempSrc: Oral Oral  Oral  SpO2: 100% 97% 97% 99%  Weight:      Height:        No intake/output data recorded.  Intake/Output Summary (Last 24 hours) at 09/22/2020 0912 Last data filed at 09/22/2020 0000 Gross per 24 hour  Intake 2138.89 ml  Output 2550 ml  Net -411.11 ml     Physical exam: BP 133/70 (BP Location: Left Arm)   Pulse 67   Temp 98.8 F (37.1 C) (Oral)   Resp 18   Ht 5\' 1"  (1.549 m)   Wt 66.4 kg   SpO2 99%   BMI 27.64 kg/m  CONSTITUTIONAL: Well-developed, well-nourished female in no acute distress.  HENT:  Normocephalic, atraumatic, External right and left ear normal. Oropharynx is clear and moist EYES: Conjunctivae and EOM are normal. Pupils are equal, round,  and reactive to light. No scleral icterus.  NECK: Normal range of motion, supple, no masses.  Normal thyroid.  SKIN: Skin is warm and dry. No rash noted. Not diaphoretic. No erythema. No pallor. NEUROLOGIC: Alert and oriented to person, place, and time. Normal reflexes, muscle tone coordination. No cranial nerve deficit noted. PSYCHIATRIC: Normal mood and affect. Normal behavior. Normal judgment and thought content. CARDIOVASCULAR: Normal heart rate noted RESPIRATORY: Effort normal, no problems with respiration noted. ABDOMEN: Soft, no distention noted.  PELVIC: deferred MUSCULOSKELETAL: Normal range of motion. No tenderness.  No cyanosis, clubbing, or edema.    UOP: voiding spontaneously  Labs  Recent Labs  Lab 09/20/20 2115  WBC 14.1*  HGB 9.4*  HCT 31.8*  PLT 332     Assessment & Plan:   Patient is 34 y.o. [id number] POD#9 s/p re-admit for postpartum preeclampsia. Now s/p 24 hrs mag, will start norvasc this am for very mildly elevated BP. Once tolerates well and BP remains normotensive, home later today. She is doing very well and eager for discharge.  norvasc 5 mg Simethicone ordered D/c later today    K. Arvilla Meres, M.D. Attending [facility] for [employer] [facility])

## 2020-09-22 NOTE — Progress Notes (Signed)
Pt discharged after discharge instructions given. All questions answered. Pharmacy hand delivered her prescription medication. IV discontinued. Pt in stable condition at discharge.

## 2020-09-22 NOTE — Progress Notes (Signed)
OB Note Patient sleeping. Will come back later  Durene Romans MD Attending [facility] for [employer] (Faculty Practice) 09/22/2020 Time: [phone]

## 2020-09-26 ENCOUNTER — Telehealth: Payer: Self-pay | Admitting: Licensed Clinical Social Worker

## 2020-09-26 ENCOUNTER — Ambulatory Visit (INDEPENDENT_AMBULATORY_CARE_PROVIDER_SITE_OTHER): Payer: Medicaid Other | Admitting: Licensed Clinical Social Worker

## 2020-09-26 DIAGNOSIS — Z5329 Procedure and treatment not carried out because of patient's decision for other reasons: Secondary | ICD-10-CM

## 2020-09-26 NOTE — Telephone Encounter (Signed)
Called pt regarding scheduled mychart appt. Left message for callback.

## 2020-09-27 NOTE — BH Specialist Note (Signed)
Pt no show appt  

## 2020-09-30 ENCOUNTER — Ambulatory Visit: Payer: Medicaid Other

## 2020-10-11 ENCOUNTER — Ambulatory Visit: Payer: Medicaid Other | Admitting: Obstetrics

## 2020-10-26 ENCOUNTER — Ambulatory Visit: Payer: Medicaid Other | Admitting: Obstetrics and Gynecology

## 2020-12-07 ENCOUNTER — Encounter: Payer: Self-pay | Admitting: General Practice

## 2020-12-20 ENCOUNTER — Ambulatory Visit: Payer: Medicaid Other | Admitting: Obstetrics

## 2021-02-02 ENCOUNTER — Ambulatory Visit (INDEPENDENT_AMBULATORY_CARE_PROVIDER_SITE_OTHER): Payer: Medicaid Other | Admitting: Obstetrics

## 2021-02-02 ENCOUNTER — Other Ambulatory Visit (HOSPITAL_COMMUNITY)
Admission: RE | Admit: 2021-02-02 | Discharge: 2021-02-02 | Disposition: A | Discharge status: Home / Self Care | Payer: Medicaid Other | Source: Ambulatory Visit | Attending: Obstetrics | Admitting: Obstetrics

## 2021-02-02 ENCOUNTER — Encounter: Payer: Self-pay | Admitting: Obstetrics

## 2021-02-02 ENCOUNTER — Other Ambulatory Visit: Payer: Self-pay

## 2021-02-02 VITALS — Wt 138.8 lb

## 2021-02-02 DIAGNOSIS — N898 Other specified noninflammatory disorders of vagina: Secondary | ICD-10-CM

## 2021-02-02 MED ORDER — FLUCONAZOLE 200 MG PO TABS: 200.0000 mg | ORAL_TABLET | ORAL | 2 refills | Status: AC

## 2021-02-02 NOTE — Progress Notes (Signed)
Pt c/o thick vaginal discharge with vaginal itching.  Normal pap on 02/23/20

## 2021-02-02 NOTE — Progress Notes (Signed)
Patient ID: Krista Vaughn, female   DOB: 1986/11/24, 35 y.o.   MRN: [id number]  Chief Complaint  Patient presents with  . Vaginal Discharge    HPI Krista Vaughn is a 35 y.o. female.  Complains of thick white vaginal discharge and vaginal irritation. HPI  Past Medical History:  Diagnosis Date  . Anxiety   . Bronchitis   . Chlamydia   . Cholecystitis   . Constipation   . Depression   . GERD (gastroesophageal reflux disease)   . H. pylori infection   . Migraine   . Uterine fibroid     Past Surgical History:  Procedure Laterality Date  . CHOLECYSTECTOMY N/A 04/26/2020   Procedure: LAPAROSCOPIC CHOLECYSTECTOMY;  Surgeon: Kinsinger, Arta Bruce, MD;  Location: [facility];  Service: General;  Laterality: N/A;  . INDUCED ABORTION    . NO PAST SURGERIES    . THERAPEUTIC ABORTION    . TUBAL LIGATION N/A 09/13/2020   Procedure: POST PARTUM TUBAL LIGATION;  Surgeon: Cherre Blanc, MD;  Location: [facility];  Service: Gynecology;  Laterality: N/A;    Family History  Problem Relation Age of Onset  . Down syndrome Sister     Social History Social History   Tobacco Use  . Smoking status: Former Smoker    Years: 4.00    Types: Cigarettes    Quit date: 01/08/2014    Years since quitting: 7.0  . Smokeless tobacco: Never Used  Vaping Use  . Vaping Use: Never used  Substance Use Topics  . Alcohol use: Not Currently    Comment: occ  . Drug use: No    Allergies  Allergen Reactions  . Ambien [Zolpidem] Other (See Comments)    Pt reports intolerance due to dizziness; prefers not to take.   . Decadron [Dexamethasone] Other (See Comments)    Makes her skin crawl    Current Outpatient Medications  Medication Sig Dispense Refill  . Blood Pressure Monitor KIT 1 Device by Does not apply route once a week. To be monitored Regularly at home. 1 kit 0  . fluconazole (DIFLUCAN) 200 MG tablet Take 1 tablet (200 mg total) by mouth every 3 (three) days. 3 tablet 2  . amLODipine  (NORVASC) 5 MG tablet Take 1 tablet (5 mg total) by mouth daily. (Patient not taking: Reported on 02/02/2021) 30 tablet 1  . Elastic Bandages & Supports (COMFORT FIT MATERNITY SUPP SM) MISC Wear as directed. (Patient not taking: Reported on 02/02/2021) 1 each 0  . hydrOXYzine (VISTARIL) 25 MG capsule Take 1 capsule (25 mg total) by mouth at bedtime as needed for anxiety. (Patient not taking: Reported on 02/02/2021) 30 capsule 1  . ibuprofen (ADVIL) 800 MG tablet Take 1 tablet (800 mg total) by mouth every 8 (eight) hours. (Patient not taking: Reported on 02/02/2021) 30 tablet 0  . Prenatal Vit-Fe Fumarate-FA (PRENATAL PO) Take 1 tablet by mouth daily. (Patient not taking: Reported on 02/02/2021)     No current facility-administered medications for this visit.    Review of Systems Review of Systems Constitutional: negative for fatigue and weight loss Respiratory: negative for cough and wheezing Cardiovascular: negative for chest pain, fatigue and palpitations Gastrointestinal: negative for abdominal pain and change in bowel habits Genitourinary: positive for thick white vaginal discharge and itching Integument/breast: negative for nipple discharge Musculoskeletal:negative for myalgias Neurological: negative for gait problems and tremors Behavioral/Psych: negative for abusive relationship, depression Endocrine: negative for temperature intolerance      Weight 138  lb 12.8 oz (63 kg), unknown if currently breastfeeding.  Physical Exam Physical Exam           General: Alert and no distress Abdomen:  normal findings: no organomegaly, soft, non-tender and no hernia  Pelvis:  External genitalia: normal general appearance Urinary system: urethral meatus normal and bladder without fullness, nontender Vaginal: normal without tenderness, induration or masses Cervix: normal appearance Adnexa: normal bimanual exam Uterus: anteverted and non-tender, normal size    50% of 15 min visit spent on counseling  and coordination of care.   Data Reviewed Wet Prep and cultures  Assessment     1. Vaginal discharge Rx: - Cervicovaginal ancillary only  2. Vaginal irritation Rx: - fluconazole (DIFLUCAN) 200 MG tablet; Take 1 tablet (200 mg total) by mouth every 3 (three) days.  Dispense: 3 tablet; Refill: 2    Plan   Follow up prn   Meds ordered this encounter  Medications  . fluconazole (DIFLUCAN) 200 MG tablet    Sig: Take 1 tablet (200 mg total) by mouth every 3 (three) days.    Dispense:  3 tablet    Refill:  2     Shelly Bombard, MD 02/02/2021 12:07 PM

## 2021-02-03 LAB — CERVICOVAGINAL ANCILLARY ONLY
Bacterial Vaginitis (gardnerella): NEGATIVE
Candida Glabrata: NEGATIVE
Candida Vaginitis: POSITIVE — AB
Chlamydia: NEGATIVE
Comment: NEGATIVE
Comment: NEGATIVE
Comment: NEGATIVE
Comment: NEGATIVE
Comment: NEGATIVE
Comment: NORMAL
Neisseria Gonorrhea: NEGATIVE
Trichomonas: NEGATIVE

## 2021-02-04 ENCOUNTER — Other Ambulatory Visit: Payer: Self-pay | Admitting: Obstetrics

## 2022-05-10 ENCOUNTER — Emergency Department (HOSPITAL_BASED_OUTPATIENT_CLINIC_OR_DEPARTMENT_OTHER)
Admission: EM | Admit: 2022-05-10 | Discharge: 2022-05-10 | Disposition: A | Discharge status: Home / Self Care | Payer: Medicaid Other | Attending: Emergency Medicine | Admitting: Emergency Medicine

## 2022-05-10 ENCOUNTER — Other Ambulatory Visit: Payer: Self-pay

## 2022-05-10 ENCOUNTER — Encounter (HOSPITAL_BASED_OUTPATIENT_CLINIC_OR_DEPARTMENT_OTHER): Payer: Self-pay

## 2022-05-10 DIAGNOSIS — R1110 Vomiting, unspecified: Secondary | ICD-10-CM | POA: Diagnosis not present

## 2022-05-10 DIAGNOSIS — R12 Heartburn: Secondary | ICD-10-CM | POA: Insufficient documentation

## 2022-05-10 DIAGNOSIS — R1310 Dysphagia, unspecified: Secondary | ICD-10-CM | POA: Insufficient documentation

## 2022-05-10 MED ORDER — LIDOCAINE VISCOUS HCL 2 % MT SOLN
15.0000 mL | Freq: Once | OROMUCOSAL | Status: AC
  Administered 2022-05-10: 15 mL via ORAL
  Filled 2022-05-10: qty 15

## 2022-05-10 MED ORDER — ONDANSETRON 4 MG PO TBDP: ORAL_TABLET | ORAL | 0 refills | Status: AC

## 2022-05-10 MED ORDER — ALUM & MAG HYDROXIDE-SIMETH 200-200-20 MG/5ML PO SUSP
30.0000 mL | Freq: Once | ORAL | Status: AC
  Administered 2022-05-10: 30 mL via ORAL
  Filled 2022-05-10: qty 30

## 2022-05-10 NOTE — ED Triage Notes (Signed)
Arrives EMS from home with globus sensation in throat after taking pepto bismaul.  ? ?Unable to tolerate oral fluids.  ?

## 2022-05-10 NOTE — ED Provider Notes (Signed)
?[facility] DEPT ?Provider Note ? ? ?CSN: [id number] ?Arrival date & time: 05/10/22  0100 ? ?  ? ?History ? ?Chief Complaint  ?Patient presents with  ? Globus  ? ? ?Krista Vaughn is a 36 y.o. female. ? ?HPI ? ?  ?Patient reports she ate [employer] for dinner.  Several hours later she woke up with heartburn that went to take a Tums tablet.  She had no Tums available so she took 2 large Pepto-Bismol tablets.  Soon after she felt that it got stuck in her throat and she has been having vomiting.  She feels like she cannot pass the tablets. ?She reports she is feeling very anxious and called 911 ?Home Medications ?Prior to Admission medications   ?Medication Sig Start Date End Date Taking? Authorizing Provider  ?ondansetron (ZOFRAN-ODT) 4 MG disintegrating tablet 4mg  ODT q4 hours prn nausea/vomit 05/10/22  Yes Ripley Fraise, MD  ?amLODipine (NORVASC) 5 MG tablet Take 1 tablet (5 mg total) by mouth daily. ?Patient not taking: Reported on 02/02/2021 09/22/20   Sloan Leiter, MD  ?Blood Pressure Monitor KIT 1 Device by Does not apply route once a week. To be monitored Regularly at home. 02/23/20   Laury Deep, CNM  ?Elastic Bandages & Supports (COMFORT FIT MATERNITY SUPP SM) MISC Wear as directed. ?Patient not taking: Reported on 02/02/2021 05/02/20   Shelly Bombard, MD  ?fluconazole (DIFLUCAN) 200 MG tablet Take 1 tablet (200 mg total) by mouth every 3 (three) days. 02/02/21   Shelly Bombard, MD  ?Prenatal Vit-Fe Fumarate-FA (PRENATAL PO) Take 1 tablet by mouth daily. ?Patient not taking: Reported on 02/02/2021    [provider]  ?   ? ?Allergies    ?Ambien [zolpidem] and Dexamethasone   ? ?Review of Systems   ?Review of Systems  ?Gastrointestinal:  Positive for vomiting.  ?Psychiatric/Behavioral:  The patient is nervous/anxious.   ? ?Physical Exam ?Updated Vital Signs ?BP 125/82   Pulse 76   Temp 98.1 ?F (36.7 ?C)   Resp (!) 22   Ht 1.549 m (5\' 1" )   Wt 62.6 kg   SpO2 100%   BMI  26.07 kg/m?  ?Physical Exam ?CONSTITUTIONAL: Well developed/well nourished, anxious ?HEAD: Normocephalic/atraumatic ?EYES: EOMI ?ENMT: Mucous membranes moist, uvula midline, no stridor, no drooling ?NECK: supple no meningeal signs ?CV: S1/S2 noted, no murmurs/rubs/gallops noted ?LUNGS: Lungs are clear to auscultation bilaterally, no apparent distress ?ABDOMEN: soft, nontender ?NEURO: Pt is awake/alert/appropriate, moves all extremitiesx4.  No facial droop.   ?EXTREMITIES: full ROM ?SKIN: warm, color normal ?PSYCH: Anxious ? ?ED Results / Procedures / Treatments   ?Labs ?(all labs ordered are listed, but only abnormal results are displayed) ?Labs Reviewed - No data to display ? ?EKG ?None ? ?Radiology ?No results found. ? ?Procedures ?Procedures  ? ? ?Medications Ordered in ED ?Medications  ?alum & mag hydroxide-simeth (MAALOX/MYLANTA) 200-200-20 MG/5ML suspension 30 mL (30 mLs Oral Given 05/10/22 0220)  ?  And  ?lidocaine (XYLOCAINE) 2 % viscous mouth solution 15 mL (15 mLs Oral Given 05/10/22 0219)  ? ? ?ED Course/ Medical Decision Making/ A&P ?Clinical Course as of 05/10/22 0320  ?Thu May 10, 2022  ?6759 Patient improved.  She is keeping down fluids.  No vomiting.  Likely had mild odynophagia due to large Pepto capsules that will eventually pass. [DW]  ?1638 Will refer to gastroenterology if symptoms persist.  No other signs of acute gastrointestinal emergency. [DW]  ?  ?Clinical Course User Index ?[DW] Christy Gentles,  Elenore Rota, MD  ? ?                        ?Medical Decision Making ?Risk ?OTC drugs. ?Prescription drug management. ? ? ? ? ? ? ? ? ? ? ?Final Clinical Impression(s) / ED Diagnoses ?Final diagnoses:  ?Dysphagia, unspecified type  ? ? ?Rx / DC Orders ?ED Discharge Orders   ? ?      Ordered  ?  ondansetron (ZOFRAN-ODT) 4 MG disintegrating tablet       ? 05/10/22 0317  ? ?  ?  ? ?  ? ? ?  ?Ripley Fraise, MD ?05/10/22 0320 ? ?

## 2022-05-10 NOTE — ED Notes (Signed)
Pt verbalizes understanding of discharge instructions. Opportunity for questioning and answers were provided. Pt discharged from ED to home.   ? ?

## 2023-05-11 ENCOUNTER — Other Ambulatory Visit: Payer: Self-pay

## 2023-05-11 ENCOUNTER — Emergency Department (HOSPITAL_COMMUNITY)
Admission: EM | Admit: 2023-05-11 | Discharge: 2023-05-12 | Disposition: A | Discharge status: Home / Self Care | Payer: Medicaid Other | Attending: Emergency Medicine | Admitting: Emergency Medicine

## 2023-05-11 DIAGNOSIS — E876 Hypokalemia: Secondary | ICD-10-CM | POA: Diagnosis not present

## 2023-05-11 DIAGNOSIS — A084 Viral intestinal infection, unspecified: Secondary | ICD-10-CM | POA: Diagnosis not present

## 2023-05-11 DIAGNOSIS — M791 Myalgia, unspecified site: Secondary | ICD-10-CM | POA: Insufficient documentation

## 2023-05-11 DIAGNOSIS — R7989 Other specified abnormal findings of blood chemistry: Secondary | ICD-10-CM

## 2023-05-11 DIAGNOSIS — R509 Fever, unspecified: Secondary | ICD-10-CM | POA: Diagnosis present

## 2023-05-11 DIAGNOSIS — R946 Abnormal results of thyroid function studies: Secondary | ICD-10-CM | POA: Insufficient documentation

## 2023-05-11 DIAGNOSIS — R000 Tachycardia, unspecified: Secondary | ICD-10-CM | POA: Diagnosis not present

## 2023-05-11 DIAGNOSIS — F419 Anxiety disorder, unspecified: Secondary | ICD-10-CM | POA: Diagnosis not present

## 2023-05-11 NOTE — ED Triage Notes (Signed)
Pt BIB [facility] from [facility]. Pt sister reports pt anxious and going back and forth to the bathroom. Pt reports she can't breath. Lung sounds are clear but pt hyperventilating. Pt c/o nausea but no vomiting. Pt reports feeling off since Thursday.  125/89BP 88HR 22RR 98%O2 126CBG

## 2023-05-12 ENCOUNTER — Emergency Department (HOSPITAL_COMMUNITY): Payer: Medicaid Other

## 2023-05-12 ENCOUNTER — Encounter (HOSPITAL_COMMUNITY): Payer: Self-pay | Admitting: Student

## 2023-05-12 LAB — CBC WITH DIFFERENTIAL/PLATELET
Abs Immature Granulocytes: 0.03 10*3/uL (ref 0.00–0.07)
Basophils Absolute: 0 10*3/uL (ref 0.0–0.1)
Basophils Relative: 0 %
Eosinophils Absolute: 0 10*3/uL (ref 0.0–0.5)
Eosinophils Relative: 0 %
HCT: 37.6 % (ref 36.0–46.0)
Hemoglobin: 11.2 g/dL — ABNORMAL LOW (ref 12.0–15.0)
Immature Granulocytes: 0 %
Lymphocytes Relative: 3 %
Lymphs Abs: 0.3 10*3/uL — ABNORMAL LOW (ref 0.7–4.0)
MCH: 23.9 pg — ABNORMAL LOW (ref 26.0–34.0)
MCHC: 29.8 g/dL — ABNORMAL LOW (ref 30.0–36.0)
MCV: 80.2 fL (ref 80.0–100.0)
Monocytes Absolute: 0.3 10*3/uL (ref 0.1–1.0)
Monocytes Relative: 3 %
Neutro Abs: 10.4 10*3/uL — ABNORMAL HIGH (ref 1.7–7.7)
Neutrophils Relative %: 94 %
Platelets: 383 10*3/uL (ref 150–400)
RBC: 4.69 MIL/uL (ref 3.87–5.11)
RDW: 17.9 % — ABNORMAL HIGH (ref 11.5–15.5)
WBC: 11.1 10*3/uL — ABNORMAL HIGH (ref 4.0–10.5)
nRBC: 0 % (ref 0.0–0.2)

## 2023-05-12 LAB — BASIC METABOLIC PANEL
Anion gap: 13 (ref 5–15)
BUN: 11 mg/dL (ref 6–20)
CO2: 20 mmol/L — ABNORMAL LOW (ref 22–32)
Calcium: 9.6 mg/dL (ref 8.9–10.3)
Chloride: 103 mmol/L (ref 98–111)
Creatinine, Ser: 0.57 mg/dL (ref 0.44–1.00)
GFR, Estimated: >60 mL/min (ref >60)
Glucose, Bld: 122 mg/dL — ABNORMAL HIGH (ref 70–99)
Potassium: 3 mmol/L — ABNORMAL LOW (ref 3.5–5.1)
Sodium: 136 mmol/L (ref 135–145)

## 2023-05-12 LAB — I-STAT BETA HCG BLOOD, ED (MC, WL, AP ONLY): I-stat hCG, quantitative: <5 m[IU]/mL (ref <5)

## 2023-05-12 LAB — CK: Total CK: 213 U/L (ref 38–234)

## 2023-05-12 LAB — TSH: TSH: 0.118 u[IU]/mL — ABNORMAL LOW (ref 0.350–4.500)

## 2023-05-12 LAB — D-DIMER, QUANTITATIVE: D-Dimer, Quant: 0.68 ug/mL-FEU — ABNORMAL HIGH (ref 0.00–0.50)

## 2023-05-12 LAB — ETHANOL: Alcohol, Ethyl (B): <10 mg/dL (ref <10)

## 2023-05-12 LAB — T4, FREE: Free T4: 0.85 ng/dL (ref 0.61–1.12)

## 2023-05-12 LAB — TROPONIN I (HIGH SENSITIVITY)
Troponin I (High Sensitivity): <2 ng/L (ref <18)
Troponin I (High Sensitivity): <2 ng/L (ref <18)

## 2023-05-12 LAB — MAGNESIUM: Magnesium: 2 mg/dL (ref 1.7–2.4)

## 2023-05-12 MED ORDER — ONDANSETRON HCL 4 MG PO TABS: 4.0000 mg | ORAL_TABLET | Freq: Four times a day (QID) | ORAL | 0 refills | Status: AC

## 2023-05-12 MED ORDER — IOHEXOL 350 MG/ML SOLN
75.0000 mL | Freq: Once | INTRAVENOUS | Status: AC | PRN
  Administered 2023-05-12: 75 mL via INTRAVENOUS

## 2023-05-12 MED ORDER — LORAZEPAM 2 MG/ML IJ SOLN
0.5000 mg | Freq: Once | INTRAMUSCULAR | Status: AC
  Administered 2023-05-12: 0.5 mg via INTRAVENOUS
  Filled 2023-05-12: qty 1

## 2023-05-12 MED ORDER — SODIUM CHLORIDE 0.9 % IV BOLUS
1000.0000 mL | Freq: Once | INTRAVENOUS | Status: AC
  Administered 2023-05-12: 1000 mL via INTRAVENOUS

## 2023-05-12 MED ORDER — HYDROMORPHONE HCL 1 MG/ML IJ SOLN
1.0000 mg | Freq: Once | INTRAMUSCULAR | Status: AC
  Administered 2023-05-12: 1 mg via INTRAVENOUS
  Filled 2023-05-12: qty 1

## 2023-05-12 MED ORDER — POTASSIUM CHLORIDE CRYS ER 20 MEQ PO TBCR
80.0000 meq | EXTENDED_RELEASE_TABLET | Freq: Once | ORAL | Status: AC
  Administered 2023-05-12: 80 meq via ORAL
  Filled 2023-05-12: qty 4

## 2023-05-12 MED ORDER — POTASSIUM CHLORIDE ER 10 MEQ PO TBCR: 10.0000 meq | EXTENDED_RELEASE_TABLET | Freq: Every day | ORAL | 0 refills | Status: AC

## 2023-05-12 NOTE — ED Notes (Signed)
Pt refused v/s..

## 2023-05-12 NOTE — Discharge Instructions (Signed)
Likely a viral infection, recommend over-the-counter pain medications like ibuprofen Tylenol for fever and pain control.  I recommend eating a bland diet, this will help with nausea vomiting diarrhea.  I did give you Zofran to take as needed for nausea.  If not eating recommend supplementing with Gatorade to help with electrolyte supplementation. Low potassium-start high potassium pill please take as prescribed please follow-up with primary doctor for reassessment of your potassium levels. TSH-was low today, this could be indication of hyperthyroidism, please follow-up with your primary care doctor and/or endocrinology for further evaluation.     Follow-up PCP for further evaluation.  Come back to the emergency department if you develop chest pain, shortness of breath, severe abdominal pain, uncontrolled nausea, vomiting, diarrhea.

## 2023-05-12 NOTE — ED Provider Notes (Signed)
[place] EMERGENCY DEPARTMENT AT [facility] Provider Note   CSN: [id number] Arrival date & time: 05/11/23  2241     History  Chief Complaint  Patient presents with   Anxiety    Krista Vaughn is a 37 y.o. female.  HPI   Patient with medical history including marijuana use, anxiety, GERD, presenting with complaints of anxiety.  Patient is a difficult historian as I entered in the room patient was found half dressed in bed, she then proceeded to defecate on herself.  Upon reentering the room after patient was cleaned up she was rolling around in bed, and states that she just feels"sick", she states that symptoms started yesterday, endorses fevers chills general body aches, some associated nausea vomiting diarrhea.  She denies any recent sick contacts, no associated cough or congestion, she states that she feels as if her chest is tight, no history of PEs or DVTs no recent surgeries no long immobilizations, currently not on oral birth control.  Patient admits to marijuana use but states she is currently denying recently denies any alcohol use or illicit drug use she denies any suicidal or homicidal ideations.  She states she just feels anxious, and this happen when she is not feeling well.  Home Medications Prior to Admission medications   Medication Sig Start Date End Date Taking? Authorizing Provider  ondansetron (ZOFRAN) 4 MG tablet Take 1 tablet (4 mg total) by mouth every 6 (six) hours. 05/12/23  Yes Carroll Sage, PA-C  potassium chloride (KLOR-CON) 10 MEQ tablet Take 1 tablet (10 mEq total) by mouth daily for 5 days. 05/12/23 05/17/23 Yes Carroll Sage, PA-C  amLODipine (NORVASC) 5 MG tablet Take 1 tablet (5 mg total) by mouth daily. Patient not taking: Reported on 02/02/2021 09/22/20   Conan Bowens, MD  Blood Pressure Monitor KIT 1 Device by Does not apply route once a week. To be monitored Regularly at home. 02/23/20   Raelyn Mora, CNM  Elastic Bandages  & Supports (COMFORT FIT MATERNITY SUPP SM) MISC Wear as directed. Patient not taking: Reported on 02/02/2021 05/02/20   Brock Bad, MD  fluconazole (DIFLUCAN) 200 MG tablet Take 1 tablet (200 mg total) by mouth every 3 (three) days. Patient not taking: Reported on 05/12/2023 02/02/21   Brock Bad, MD  ondansetron (ZOFRAN-ODT) 4 MG disintegrating tablet 4mg  ODT q4 hours prn nausea/vomit Patient not taking: Reported on 05/12/2023 05/10/22   Zadie Rhine, MD      Allergies    Ambien [zolpidem] and Dexamethasone    Review of Systems   Review of Systems  Constitutional:  Positive for chills and fever.  Respiratory:  Positive for chest tightness and shortness of breath.   Cardiovascular:  Negative for chest pain.  Gastrointestinal:  Negative for abdominal pain.  Neurological:  Negative for headaches.  Psychiatric/Behavioral:  The patient is nervous/anxious.     Physical Exam Updated Vital Signs BP 116/80   Pulse 94   Temp 97.6 F (36.4 C)   Resp 17   LMP 05/01/2023   SpO2 100%  Physical Exam Vitals and nursing note reviewed.  Constitutional:      General: She is not in acute distress.    Appearance: She is not ill-appearing.  HENT:     Head: Normocephalic and atraumatic.     Nose: No congestion.     Mouth/Throat:     Mouth: Mucous membranes are moist.     Pharynx: Oropharynx is clear.  Eyes:  Conjunctiva/sclera: Conjunctivae normal.  Cardiovascular:     Rate and Rhythm: Regular rhythm. Tachycardia present.     Pulses: Normal pulses.     Heart sounds: No murmur heard.    No friction rub. No gallop.  Pulmonary:     Effort: No respiratory distress.     Breath sounds: No wheezing, rhonchi or rales.     Comments: Speaking full sentences, no evidence of respiratory distress, nontachypneic, nonhypoxic, lung sounds are clear bilaterally. Abdominal:     Palpations: Abdomen is soft.     Tenderness: There is no abdominal tenderness. There is no right CVA tenderness or  left CVA tenderness.     Comments: Abdomen is soft nontender  Musculoskeletal:     Right lower leg: No edema.     Left lower leg: No edema.     Comments: No unilateral leg swelling no calf tenderness no palpable cords.  Skin:    General: Skin is warm and dry.  Neurological:     Mental Status: She is alert.  Psychiatric:        Mood and Affect: Mood normal.     Comments: Patient appears to be anxious on my exam but does make good eye contact, does not endorse any suicidal or homicidal ideations, does not appear to be respond to internal stimuli.     ED Results / Procedures / Treatments   Labs (all labs ordered are listed, but only abnormal results are displayed) Labs Reviewed  BASIC METABOLIC PANEL - Abnormal; Notable for the following components:      Result Value   Potassium 3.0 (*)    CO2 20 (*)    Glucose, Bld 122 (*)    All other components within normal limits  CBC WITH DIFFERENTIAL/PLATELET - Abnormal; Notable for the following components:   WBC 11.1 (*)    Hemoglobin 11.2 (*)    MCH 23.9 (*)    MCHC 29.8 (*)    RDW 17.9 (*)    Neutro Abs 10.4 (*)    Lymphs Abs 0.3 (*)    All other components within normal limits  TSH - Abnormal; Notable for the following components:   TSH 0.118 (*)    All other components within normal limits  D-DIMER, QUANTITATIVE - Abnormal; Notable for the following components:   D-Dimer, Quant 0.68 (*)    All other components within normal limits  ETHANOL  CK  MAGNESIUM  PREGNANCY, URINE  RAPID URINE DRUG SCREEN, HOSP PERFORMED  T4, FREE  I-STAT BETA HCG BLOOD, ED ([facility], [facility], [facility] ONLY)  TROPONIN I (HIGH SENSITIVITY)  TROPONIN I (HIGH SENSITIVITY)    EKG EKG Interpretation  Date/Time:  Sunday May 12 2023 01:39:38 EDT Ventricular Rate:  89 PR Interval:  161 QRS Duration: 73 QT Interval:  360 QTC Calculation: 438 R Axis:   58 Text Interpretation: Sinus rhythm Normal ECG When compared with ECG of 09/20/2020, No significant change was  found Confirmed by Dione Booze ([id number]) on 05/12/2023 1:51:28 AM  Radiology CT Angio Chest PE W and/or Wo Contrast  Result Date: 05/12/2023 CLINICAL DATA:  Pulmonary embolism suspected, high probability. EXAM: CT ANGIOGRAPHY CHEST WITH CONTRAST TECHNIQUE: Multidetector CT imaging of the chest was performed using the standard protocol during bolus administration of intravenous contrast. Multiplanar CT image reconstructions and MIPs were obtained to evaluate the vascular anatomy. RADIATION DOSE REDUCTION: This exam was performed according to the departmental dose-optimization program which includes automated exposure control, adjustment of the mA and/or kV according to patient  size and/or use of iterative reconstruction technique. CONTRAST:  75mL OMNIPAQUE IOHEXOL 350 MG/ML SOLN COMPARISON:  None Available. FINDINGS: Cardiovascular: The heart is normal in size and there is no pericardial effusion. The aorta and pulmonary trunk are normal in caliber. No pulmonary artery filling defect is seen Mediastinum/Nodes: No mediastinal, hilar, or axillary lymphadenopathy. The thyroid gland, trachea, and esophagus are within normal limits. Lungs/Pleura: Lungs are clear. No pleural effusion or pneumothorax. Upper Abdomen: No acute abnormality. Musculoskeletal: No acute osseous abnormality. Review of the MIP images confirms the above findings. IMPRESSION: No evidence of pulmonary embolism or other acute process. Electronically Signed   By: Thornell Sartorius M.D.   On: 05/12/2023 04:12   DG Chest 2 View  Result Date: 05/12/2023 CLINICAL DATA:  Chest pain EXAM: CHEST - 2 VIEW COMPARISON:  10/03/2018 FINDINGS: The heart size and mediastinal contours are within normal limits. Both lungs are clear. The visualized skeletal structures are unremarkable. IMPRESSION: No active cardiopulmonary disease. Electronically Signed   By: Alcide Clever M.D.   On: 05/12/2023 00:26    Procedures Procedures    Medications Ordered in  ED Medications  LORazepam (ATIVAN) injection 0.5 mg (0.5 mg Intravenous Given 05/12/23 0138)  iohexol (OMNIPAQUE) 350 MG/ML injection 75 mL (75 mLs Intravenous Contrast Given 05/12/23 0350)  HYDROmorphone (DILAUDID) injection 1 mg (1 mg Intravenous Given 05/12/23 0251)  potassium chloride SA (KLOR-CON M) CR tablet 80 mEq (80 mEq Oral Given 05/12/23 0407)  sodium chloride 0.9 % bolus 1,000 mL (1,000 mLs Intravenous New Bag/Given 05/12/23 0407)    ED Course/ Medical Decision Making/ A&P                             Medical Decision Making Amount and/or Complexity of Data Reviewed Labs: ordered. Radiology: ordered.  Risk Prescription drug management.   This patient presents to the ED for concern of malaise, anxiety this involves an extensive number of treatment options, and is a complaint that carries with it a high risk of complications and morbidity.  The differential diagnosis includes ACS, PE, metabolic abnormality, psychiatric emergency    Additional history obtained:  Additional history obtained from N/A External records from outside source obtained and reviewed including recent ER note   Co morbidities that complicate the patient evaluation  Psychiatric disorder  Social Determinants of Health:  No primary care provider    Lab Tests:  I Ordered, and personally interpreted labs.  The pertinent results include: CBC shows slight leukocytosis of 11.1, hemoglobin 11.2, BMP reveals potassium 3.0, CO2 of 20, glucose 112, glucose ethanol negative, troponin negative, CK2 1 3, TSH 0.118, D-dimer elevated at 0.68, magnesium 2.0   Imaging Studies ordered:  I ordered imaging studies including chest x-ray I independently visualized and interpreted imaging which showed unremarkable I agree with the radiologist interpretation   Cardiac Monitoring:  The patient was maintained on a cardiac monitor.  I personally viewed and interpreted the cardiac monitored which showed an  underlying rhythm of: Sinus without signs of ischemia   Medicines ordered and prescription drug management:  I ordered medication including Ativan I have reviewed the patients home medicines and have made adjustments as needed  Critical Interventions:  N/A   Reevaluation:  Difficult historian, but appears to be anxious on my exam, will obtain screening lab workup provided with some Ativan and reassess.  Reassessed the patient, she was found laying face on the bed, she appears to be more relaxed  at this time, she is at time that she hurts all over, her legs or arms, all compartments are soft on my exam, all of which had good distal pulses, will add on a CK to ensure that she does not have rhabdomyolysis.  Patient's D-dimer is elevated, will send him for CT of chest, she also has a noted decrease TSH indicating hyperthyroidism, possibly because of her anxiety.  Will add on T4 for further assessment.  Patient was reassessed resting comfortably, vital signs reassuring, she is agreement discharge at this time.    Consultations Obtained:  N/a    Test Considered:  N/a    Rule out I have low suspicion for ACS as history is atypical, patient has no cardiac history, EKG was  without signs of ischemia, patient had negative delta troponin.  Low suspicion for PE AAA or dissection on CT imaging and exam of these findings.  Suspicion for thyroid storm or need to be admitted for hyperthyroidism is low low she is not tachycardic, afebrile, no evidence of congestive heart failure.  Doubt rhabdomyolysis CK is within normal limits, no AKI or kidney damage present suspicion for systemic infection as patient is nontoxic-appearing, vital signs reassuring, no obvious source infection noted on exam.     Dispostion and problem list  After consideration of the diagnostic results and the patients response to treatment, I feel that the patent would benefit from discharge.  Low TSH-patient will need  further workup as an outpatient, will refer her to her primary care doctor as well as endocrinology for further evaluation. Hypokalemia-likely secondary due to nausea vomiting diarrhea, will send home with oral supplements of potassium follow-up with her primary doctor for reassessment. Viral gastritis-suspect this is the cause of her overall symptoms, will recommend symptom management, provided with antiemetics, follow-up with peace PCP for further assessment.            Final Clinical Impression(s) / ED Diagnoses Final diagnoses:  Low TSH level  Hypokalemia    Rx / DC Orders ED Discharge Orders          Ordered    potassium chloride (KLOR-CON) 10 MEQ tablet  Daily        05/12/23 0545    ondansetron (ZOFRAN) 4 MG tablet  Every 6 hours        05/12/23 0545              Carroll Sage, PA-C 05/12/23 0547    Dione Booze, MD 05/12/23 [phone]

## 2024-09-18 ENCOUNTER — Emergency Department (HOSPITAL_COMMUNITY)
Admission: EM | Admit: 2024-09-18 | Discharge: 2024-09-18 | Disposition: A | Discharge status: Home / Self Care | Attending: Emergency Medicine | Admitting: Emergency Medicine

## 2024-09-18 ENCOUNTER — Emergency Department (HOSPITAL_COMMUNITY)

## 2024-09-18 ENCOUNTER — Encounter (HOSPITAL_COMMUNITY): Payer: Self-pay | Admitting: Emergency Medicine

## 2024-09-18 DIAGNOSIS — M79645 Pain in left finger(s): Secondary | ICD-10-CM | POA: Diagnosis present

## 2024-09-18 MED ORDER — PREDNISONE 20 MG PO TABS
40.0000 mg | ORAL_TABLET | Freq: Once | ORAL | Status: AC
  Administered 2024-09-18: 40 mg via ORAL
  Filled 2024-09-18: qty 2

## 2024-09-18 MED ORDER — HYDROCODONE-ACETAMINOPHEN 5-325 MG PO TABS: 1.0000 | ORAL_TABLET | Freq: Four times a day (QID) | ORAL | 0 refills | Status: AC | PRN

## 2024-09-18 MED ORDER — HYDROCODONE-ACETAMINOPHEN 5-325 MG PO TABS: 1.0000 | ORAL_TABLET | Freq: Four times a day (QID) | ORAL | 0 refills | Status: DC | PRN

## 2024-09-18 MED ORDER — PREDNISONE 10 MG PO TABS: 20.0000 mg | ORAL_TABLET | Freq: Two times a day (BID) | ORAL | 0 refills | Status: AC

## 2024-09-18 MED ORDER — HYDROCODONE-ACETAMINOPHEN 5-325 MG PO TABS
2.0000 | ORAL_TABLET | Freq: Once | ORAL | Status: AC
  Administered 2024-09-18: 2 via ORAL
  Filled 2024-09-18: qty 2

## 2024-09-18 MED ORDER — KETOROLAC TROMETHAMINE 60 MG/2ML IM SOLN: 60.0000 mg | Freq: Once | INTRAMUSCULAR | Status: DC

## 2024-09-18 MED ORDER — PREDNISONE 10 MG PO TABS: 20.0000 mg | ORAL_TABLET | Freq: Two times a day (BID) | ORAL | 0 refills | Status: DC

## 2024-09-18 NOTE — ED Triage Notes (Addendum)
 Pt reports 2 weeks of not being able to properly bend or use her left thumb. Does braiding for living so repetitive use. States issues started while ago with numbness. With rest that got better but now has cracking and inability to bend.

## 2024-09-18 NOTE — ED Provider Notes (Signed)
 [place] EMERGENCY DEPARTMENT AT [facility] Provider Note   CSN: [id number] Arrival date & time: 09/18/24  9667     Patient presents with: Finger Injury   Krista Vaughn is a 38 y.o. female.   Patient is a 38 year old female presenting with complaints of left thumb pain.  This has been worsening for several weeks.  She finds it difficult to bend at the IP joint.  For a while, she was noticing a popping sensation, but now it is difficult for the thumb to move at all.  She denies any specific injury or trauma, but does perform braiding on a regular basis.       Prior to Admission medications   Medication Sig Start Date End Date Taking? Authorizing Provider  amLODipine  (NORVASC ) 5 MG tablet Take 1 tablet (5 mg total) by mouth daily. Patient not taking: Reported on 02/02/2021 09/22/20   Nicholaus Burnard HERO, MD  Blood Pressure Monitor KIT 1 Device by Does not apply route once a week. To be monitored Regularly at home. 02/23/20   Dawson, Rolitta, CNM  Elastic Bandages & Supports (COMFORT FIT MATERNITY SUPP SM) MISC Wear as directed. Patient not taking: Reported on 02/02/2021 05/02/20   Rudy Carlin LABOR, MD  fluconazole  (DIFLUCAN ) 200 MG tablet Take 1 tablet (200 mg total) by mouth every 3 (three) days. Patient not taking: Reported on 05/12/2023 02/02/21   Rudy Carlin LABOR, MD  ondansetron  (ZOFRAN ) 4 MG tablet Take 1 tablet (4 mg total) by mouth every 6 (six) hours. 05/12/23   Waylan Elsie PARAS, PA-C  ondansetron  (ZOFRAN -ODT) 4 MG disintegrating tablet 4mg  ODT q4 hours prn nausea/vomit Patient not taking: Reported on 05/12/2023 05/10/22   Midge Golas, MD  potassium chloride  (KLOR-CON ) 10 MEQ tablet Take 1 tablet (10 mEq total) by mouth daily for 5 days. 05/12/23 05/17/23  Waylan Elsie PARAS, PA-C    Allergies: Ambien  [zolpidem ] and Dexamethasone     Review of Systems  All other systems reviewed and are negative.   Updated Vital Signs BP 125/72   Pulse 82   Temp 98.3 F (36.8  C)   Resp 17   SpO2 100%   Physical Exam Vitals and nursing note reviewed.  Constitutional:      Appearance: Normal appearance.  Pulmonary:     Effort: Pulmonary effort is normal.  Musculoskeletal:     Comments: The left thumb appears grossly normal.  She does have pain when bending the IP joint.  Skin:    General: Skin is warm and dry.  Neurological:     Mental Status: She is alert and oriented to person, place, and time.     (all labs ordered are listed, but only abnormal results are displayed) Labs Reviewed - No data to display  EKG: None  Radiology: DG Finger Thumb Left Result Date: 09/18/2024 CLINICAL DATA:  38 year old female with pain and decreased range of motion for the past 2 weeks. Possible repetitive use injury. EXAM: LEFT THUMB 2+V COMPARISON:  None Available. FINDINGS: Background bone mineralization is normal. No fracture or dislocation. No acute osseous abnormality identified. There does appear to be thumb IP joint space loss, mild to moderate for age, along with some subchondral sclerosis of the phalanges there. No discrete soft tissue abnormality. IMPRESSION: 1. No acute osseous abnormality identified. 2. Mild to moderate for age thumb IP joint space loss. Electronically Signed   By: VEAR Hurst M.D.   On: 09/18/2024 04:02     Procedures   Medications Ordered  in the ED - No data to display                                  Medical Decision Making Amount and/or Complexity of Data Reviewed Radiology: ordered.   Patient presenting with left thumb pain as described in the HPI.  X-rays show mild to moderate for age thumb IP joint space loss.  Whether she is experiencing some sort of arthritis or possibly a trigger thumb, I am uncertain.  Patient will be placed in a thumb spica splint, provided prednisone , and will be referred to hand surgery.     Final diagnoses:  None    ED Discharge Orders     None          Geroldine Berg, MD 09/18/24 [phone]

## 2024-09-18 NOTE — Discharge Instructions (Signed)
 Wear thumb spica splint for comfort and support.  Begin taking prednisone  as prescribed.  Follow-up with Dr. Kuzma in the next week.  His contact information has been provided in this discharge summary for you to call and make these arrangements.

## 2024-09-23 ENCOUNTER — Emergency Department (HOSPITAL_COMMUNITY)
Admission: EM | Admit: 2024-09-23 | Discharge: 2024-09-23 | Discharge status: Against Medical Advice | Source: Home / Self Care
